# Patient Record
Sex: Male | Born: 1935
Health system: Southern US, Community
[De-identification: ages and names within clinical notes are randomized; demographics above are authoritative.]

## PROBLEM LIST (undated history)

## (undated) DIAGNOSIS — I4891 Unspecified atrial fibrillation: Secondary | ICD-10-CM

## (undated) DIAGNOSIS — M1612 Unilateral primary osteoarthritis, left hip: Secondary | ICD-10-CM

## (undated) DIAGNOSIS — I10 Essential (primary) hypertension: Secondary | ICD-10-CM

## (undated) DIAGNOSIS — I499 Cardiac arrhythmia, unspecified: Secondary | ICD-10-CM

## (undated) DIAGNOSIS — R2681 Unsteadiness on feet: Secondary | ICD-10-CM

## (undated) DIAGNOSIS — R351 Nocturia: Secondary | ICD-10-CM

## (undated) DIAGNOSIS — M1990 Unspecified osteoarthritis, unspecified site: Secondary | ICD-10-CM

## (undated) DIAGNOSIS — K219 Gastro-esophageal reflux disease without esophagitis: Secondary | ICD-10-CM

## (undated) DIAGNOSIS — N400 Enlarged prostate without lower urinary tract symptoms: Secondary | ICD-10-CM

## (undated) DIAGNOSIS — D471 Chronic myeloproliferative disease: Secondary | ICD-10-CM

## (undated) DIAGNOSIS — D62 Acute posthemorrhagic anemia: Secondary | ICD-10-CM

## (undated) DIAGNOSIS — K5900 Constipation, unspecified: Secondary | ICD-10-CM

## (undated) DIAGNOSIS — L409 Psoriasis, unspecified: Secondary | ICD-10-CM

## (undated) DIAGNOSIS — J309 Allergic rhinitis, unspecified: Secondary | ICD-10-CM

## (undated) DIAGNOSIS — D649 Anemia, unspecified: Secondary | ICD-10-CM

## (undated) HISTORY — DX: Unilateral primary osteoarthritis, left hip: M16.12

## (undated) HISTORY — DX: Constipation, unspecified: K59.00

## (undated) HISTORY — DX: Acute posthemorrhagic anemia: D62

## (undated) HISTORY — DX: Unsteadiness on feet: R26.81

## (undated) HISTORY — DX: Allergic rhinitis, unspecified: J30.9

## (undated) HISTORY — DX: Unspecified atrial fibrillation: I48.91

---

## 1996-11-21 HISTORY — PX: HERNIA REPAIR: SHX51

## 2000-09-01 ENCOUNTER — Encounter: Payer: Self-pay | Admitting: General Surgery

## 2000-09-01 ENCOUNTER — Encounter: Admission: RE | Admit: 2000-09-01 | Discharge: 2000-09-01 | Payer: Self-pay | Admitting: General Surgery

## 2000-09-04 ENCOUNTER — Ambulatory Visit (HOSPITAL_BASED_OUTPATIENT_CLINIC_OR_DEPARTMENT_OTHER): Admission: RE | Admit: 2000-09-04 | Discharge: 2000-09-04 | Payer: Self-pay | Admitting: General Surgery

## 2001-08-10 ENCOUNTER — Ambulatory Visit (HOSPITAL_BASED_OUTPATIENT_CLINIC_OR_DEPARTMENT_OTHER): Admission: RE | Admit: 2001-08-10 | Discharge: 2001-08-10 | Payer: Self-pay | Admitting: *Deleted

## 2013-05-08 ENCOUNTER — Telehealth: Payer: Self-pay | Admitting: Internal Medicine

## 2013-05-08 NOTE — Telephone Encounter (Signed)
C/D 05/07/13 for appt. 06/03/13

## 2013-05-31 ENCOUNTER — Other Ambulatory Visit: Payer: Self-pay | Admitting: Medical Oncology

## 2013-05-31 DIAGNOSIS — D72829 Elevated white blood cell count, unspecified: Secondary | ICD-10-CM

## 2013-06-03 ENCOUNTER — Ambulatory Visit (HOSPITAL_BASED_OUTPATIENT_CLINIC_OR_DEPARTMENT_OTHER): Payer: Medicare Other | Admitting: Internal Medicine

## 2013-06-03 ENCOUNTER — Encounter: Payer: Self-pay | Admitting: Internal Medicine

## 2013-06-03 ENCOUNTER — Other Ambulatory Visit (HOSPITAL_BASED_OUTPATIENT_CLINIC_OR_DEPARTMENT_OTHER): Payer: Medicare Other | Admitting: Lab

## 2013-06-03 ENCOUNTER — Ambulatory Visit: Payer: Self-pay

## 2013-06-03 ENCOUNTER — Telehealth: Payer: Self-pay | Admitting: Internal Medicine

## 2013-06-03 VITALS — BP 151/75 | HR 54 | Temp 97.3°F | Resp 18 | Ht 68.0 in | Wt 185.9 lb

## 2013-06-03 DIAGNOSIS — D72829 Elevated white blood cell count, unspecified: Secondary | ICD-10-CM

## 2013-06-03 LAB — COMPREHENSIVE METABOLIC PANEL (CC13)
ALT: 16 U/L (ref 0–55)
AST: 19 U/L (ref 5–34)
Albumin: 3.8 g/dL (ref 3.5–5.0)
Alkaline Phosphatase: 92 U/L (ref 40–150)
BUN: 20 mg/dL (ref 7.0–26.0)
CO2: 26 mEq/L (ref 22–29)
Calcium: 10.6 mg/dL — ABNORMAL HIGH (ref 8.4–10.4)
Chloride: 106 mEq/L (ref 98–109)
Creatinine: 1.2 mg/dL (ref 0.7–1.3)
Glucose: 92 mg/dl (ref 70–140)
Potassium: 4.3 mEq/L (ref 3.5–5.1)
Sodium: 141 mEq/L (ref 136–145)
Total Bilirubin: 0.67 mg/dL (ref 0.20–1.20)
Total Protein: 8 g/dL (ref 6.4–8.3)

## 2013-06-03 LAB — CBC WITH DIFFERENTIAL/PLATELET
BASO%: 0.3 % (ref 0.0–2.0)
Basophils Absolute: 0 10*3/uL (ref 0.0–0.1)
EOS%: 0.9 % (ref 0.0–7.0)
Eosinophils Absolute: 0.1 10*3/uL (ref 0.0–0.5)
HCT: 45.6 % (ref 38.4–49.9)
HGB: 15.3 g/dL (ref 13.0–17.1)
LYMPH%: 16.8 % (ref 14.0–49.0)
MCH: 31.5 pg (ref 27.2–33.4)
MCHC: 33.6 g/dL (ref 32.0–36.0)
MCV: 93.8 fL (ref 79.3–98.0)
MONO#: 1.1 10*3/uL — ABNORMAL HIGH (ref 0.1–0.9)
MONO%: 8.3 % (ref 0.0–14.0)
NEUT#: 10.2 10*3/uL — ABNORMAL HIGH (ref 1.5–6.5)
NEUT%: 73.7 % (ref 39.0–75.0)
Platelets: 380 10*3/uL (ref 140–400)
RBC: 4.86 10*6/uL (ref 4.20–5.82)
RDW: 14.3 % (ref 11.0–14.6)
WBC: 13.8 10*3/uL — ABNORMAL HIGH (ref 4.0–10.3)
lymph#: 2.3 10*3/uL (ref 0.9–3.3)

## 2013-06-03 NOTE — Progress Notes (Signed)
[facility] [phone]   [phone]  CONSULT NOTE  REFERRING PHYSICIAN: Dr. Catha Gosselin  REASON FOR CONSULTATION:  79 years old African American male with persistent leukocytosis.  HPI Adam Tapia is a 79 y.o. male with past medical history significant for hypertension, GERD, depression, benign prostatic hypertrophy, allergic rhinitis as well as psoriasis. The patient was seen recently by his primary care physician Dr. Biagio Tapia and repeat CBC on 05/02/2013 showed elevated white blood count of 15.7 with absolute neutrophil count of 11,500 and elevated monocytes of 150. Previous CBC on 05/01/2013 showed elevated white blood count of 14.3 with absolute neutrophil count of 10,400. The patient has normal hemoglobin, hematocrit as well as platelets count. Because of the persistent leukocytosis he was referred to me today for evaluation and recommendation regarding his condition. The patient denied having any recurrent infection specifically no effect infection or upper respiratory infection. He denied having any inflammatory process. He is not on any steroid or other hormonal treatment. He is feeling fine today with no specific complaints except for pain on the left leg and left hip. He is currently on Turmeric herbs powder for arthritis. He denied having any significant weight loss or night sweats. He denied having any chest pain, shortness breath, cough or hemoptysis. The patient denied having any bleeding issues.  PAST MEDICAL HISTORY: Hypertension, GERD, depression, benign prostatic hypertrophy, psoriasis and allergic rhinitis. The patient denied having any history of diabetes mellitus, coronary artery disease or stroke.   FAMILY HISTORY: Mother had heart disease and father had neuritis.  SOCIAL HISTORY: The patient is married and has one daughter. He has no history of smoking, alcohol or drug abuse.  No Known Allergies  Current Outpatient  Prescriptions  Medication Sig Dispense Refill  . amLODipine (NORVASC) 5 MG tablet Take 5 mg by mouth daily.      Marland Kitchen atenolol (TENORMIN) 50 MG tablet Take 50 mg by mouth daily.      . digoxin (LANOXIN) 0.25 MG tablet Take 0.25 mg by mouth daily.      . fexofenadine (ALLEGRA) 180 MG tablet Take 180 mg by mouth daily.      Marland Kitchen ketotifen (ZADITOR) 0.025 % ophthalmic solution 1 drop 2 (two) times daily.      Marland Kitchen triamterene-hydrochlorothiazide (MAXZIDE-25) 37.5-25 MG per tablet Take 1 tablet by mouth daily.      . Turmeric POWD by Does not apply route.       No current facility-administered medications for this visit.    Review of Systems  A comprehensive review of systems was negative except for: Musculoskeletal: positive for arthralgias and bone pain  Physical Exam  AVW:UJWJX, healthy, no distress, well nourished and well developed SKIN: skin color, texture, turgor are normal HEAD: Normocephalic, No masses, lesions, tenderness or abnormalities EYES: normal EARS: External ears normal OROPHARYNX:no exudate and no erythema  NECK: supple, no adenopathy LYMPH:  no palpable lymphadenopathy, no hepatosplenomegaly, moderate nontender anterior cervical nodes, moderate tender anterior cervical nodes LUNGS: clear to auscultation  HEART: regular rate & rhythm and no murmurs ABDOMEN:abdomen soft, non-tender, normal bowel sounds and no masses or organomegaly BACK: Back symmetric, no curvature. EXTREMITIES:no joint deformities, effusion, or inflammation, no edema, no skin discoloration  NEURO: alert & oriented x 3 with fluent speech, no focal motor/sensory deficits  PERFORMANCE STATUS: ECOG 1  LABORATORY DATA: Lab Results  Component Value Date   WBC 13.8* 06/03/2013   HGB 15.3 06/03/2013   HCT 45.6 06/03/2013  MCV 93.8 06/03/2013   PLT 380 06/03/2013      Chemistry      Component Value Date/Time   NA 141 06/03/2013 1346   K 4.3 06/03/2013 1346   CO2 26 06/03/2013 1346   BUN 20.0 06/03/2013 1346     CREATININE 1.2 06/03/2013 1346      Component Value Date/Time   CALCIUM 10.6* 06/03/2013 1346   ALKPHOS 92 06/03/2013 1346   AST 19 06/03/2013 1346   ALT 16 06/03/2013 1346   BILITOT 0.67 06/03/2013 1346       RADIOGRAPHIC STUDIES: No results found.  ASSESSMENT: This is a very pleasant 79 years old [name] with persistent leukocytosis and neutrophilia of unclear etiology but most likely reactive in nature. I cannot rule it out myeloproliferative disorder at this point.  PLAN: I have a lengthy discussion with the patient and his wife today about his current disease status and further investigation to rule out myeloproliferative disorder. I several studies today including repeat CBC, comprehensive metabolic panel in addition to molecular study for BCR/ABL and leukocyte alkaline phosphatase. I will see the patient back for followup visit in 3 weeks for reevaluation. He was advised to call immediately if he has any concerning symptoms in the interval  All questions were answered. The patient knows to call the clinic with any problems, questions or concerns. We can certainly see the patient much sooner if necessary.  Thank you so much for allowing me to participate in the care of Adam Tapia. I will continue to follow up the patient with you and assist in his care.  I spent 35 minutes counseling the patient face to face. The total time spent in the appointment was 50 minutes.   Loretha Ure K. 06/03/2013, 3:20 PM

## 2013-06-03 NOTE — Patient Instructions (Signed)
You have persistent leukocytosis most likely reactive in nature but I ordered your lab test to rule out myeloproliferative disorder.  Followup visit in 3 weeks

## 2013-06-03 NOTE — Telephone Encounter (Signed)
gv and printed appt sched and avs for pt  °

## 2013-06-04 ENCOUNTER — Other Ambulatory Visit (HOSPITAL_BASED_OUTPATIENT_CLINIC_OR_DEPARTMENT_OTHER): Payer: Medicare Other | Admitting: Lab

## 2013-06-04 DIAGNOSIS — D72829 Elevated white blood cell count, unspecified: Secondary | ICD-10-CM

## 2013-06-04 LAB — CBC WITH DIFFERENTIAL/PLATELET
Eosinophils Absolute: 0.2 10*3/uL (ref 0.0–0.5)
HCT: 43 % (ref 38.4–49.9)
LYMPH%: 13.8 % — ABNORMAL LOW (ref 14.0–49.0)
MCV: 94 fL (ref 79.3–98.0)
MONO#: 1.1 10*3/uL — ABNORMAL HIGH (ref 0.1–0.9)
MONO%: 7.4 % (ref 0.0–14.0)
NEUT#: 11.2 10*3/uL — ABNORMAL HIGH (ref 1.5–6.5)
NEUT%: 77.1 % — ABNORMAL HIGH (ref 39.0–75.0)
Platelets: 383 10*3/uL (ref 140–400)
RBC: 4.58 10*6/uL (ref 4.20–5.82)
WBC: 14.6 10*3/uL — ABNORMAL HIGH (ref 4.0–10.3)

## 2013-06-21 ENCOUNTER — Other Ambulatory Visit: Payer: Self-pay | Admitting: *Deleted

## 2013-06-21 DIAGNOSIS — D72829 Elevated white blood cell count, unspecified: Secondary | ICD-10-CM

## 2013-06-24 ENCOUNTER — Other Ambulatory Visit (HOSPITAL_BASED_OUTPATIENT_CLINIC_OR_DEPARTMENT_OTHER): Payer: Medicare Other | Admitting: Lab

## 2013-06-24 ENCOUNTER — Ambulatory Visit (HOSPITAL_BASED_OUTPATIENT_CLINIC_OR_DEPARTMENT_OTHER): Payer: Medicare Other | Admitting: Internal Medicine

## 2013-06-24 ENCOUNTER — Encounter: Payer: Self-pay | Admitting: Internal Medicine

## 2013-06-24 ENCOUNTER — Ambulatory Visit: Payer: Medicare Other | Admitting: Internal Medicine

## 2013-06-24 VITALS — BP 160/80 | HR 56 | Temp 97.8°F | Resp 18 | Ht 68.0 in | Wt 187.1 lb

## 2013-06-24 DIAGNOSIS — D72829 Elevated white blood cell count, unspecified: Secondary | ICD-10-CM

## 2013-06-24 LAB — CBC WITH DIFFERENTIAL/PLATELET
BASO%: 0.4 % (ref 0.0–2.0)
EOS%: 1.2 % (ref 0.0–7.0)
HCT: 44.4 % (ref 38.4–49.9)
LYMPH%: 19.3 % (ref 14.0–49.0)
MCH: 31.5 pg (ref 27.2–33.4)
MCHC: 33.4 g/dL (ref 32.0–36.0)
MCV: 94.3 fL (ref 79.3–98.0)
MONO#: 1.2 10*3/uL — ABNORMAL HIGH (ref 0.1–0.9)
MONO%: 8.5 % (ref 0.0–14.0)
NEUT%: 70.6 % (ref 39.0–75.0)
Platelets: 383 10*3/uL (ref 140–400)
RBC: 4.71 10*6/uL (ref 4.20–5.82)
WBC: 14 10*3/uL — ABNORMAL HIGH (ref 4.0–10.3)

## 2013-06-24 NOTE — Patient Instructions (Signed)
Continue followup visit with her primary care physician

## 2013-06-24 NOTE — Progress Notes (Signed)
[facility] [phone]   [phone]  OFFICE PROGRESS NOTE  Mickie Hillier, MD [address] [place] [address]  DIAGNOSIS: Persistent leukocytosis most likely reactive in nature  PRIOR THERAPY: None  CURRENT THERAPY: Observation  INTERVAL HISTORY: Adam Tapia 79 y.o. male returns to the clinic today for followup visit accompanied his wife. The patient is feeling fine today with no specific complaints except for mild fatigue. He denied having any bleeding issues. He denied having any bruises or ecchymosis. The patient denied having any palpable lymphadenopathy. He has no nausea or vomiting. He has no chest pain, shortness breath, cough or hemoptysis. No significant weight loss or night sweats. He was seen 3 weeks ago for evaluation of persistent leukocytosis and I ordered several studies to rule out myeloproliferative disorder including leukocyte alkaline phosphatase in addition to molecular study for BCR/ABL. These tests were unremarkable for any evidence of myeloproliferative disorder. He is here today for evaluation and discussion of his lab results.   ALLERGIES:  has No Known Allergies.  MEDICATIONS:  Current Outpatient Prescriptions  Medication Sig Dispense Refill  . amLODipine (NORVASC) 5 MG tablet Take 5 mg by mouth 2 (two) times daily.       Marland Kitchen atenolol (TENORMIN) 50 MG tablet Take 50 mg by mouth daily.      . digoxin (LANOXIN) 0.25 MG tablet Take 0.25 mg by mouth daily.      . fexofenadine (ALLEGRA) 180 MG tablet Take 180 mg by mouth daily.      Marland Kitchen ketotifen (ZADITOR) 0.025 % ophthalmic solution 1 drop 2 (two) times daily.      Marland Kitchen losartan (COZAAR) 25 MG tablet Take 25 mg by mouth daily.      Marland Kitchen triamterene-hydrochlorothiazide (MAXZIDE-25) 37.5-25 MG per tablet Take 1 tablet by mouth daily.      . Turmeric POWD by Does not apply route.       No current facility-administered medications for this visit.    REVIEW OF  SYSTEMS:  A comprehensive review of systems was negative except for: Constitutional: positive for fatigue   PHYSICAL EXAMINATION: General appearance: alert, cooperative, fatigued and no distress Head: Normocephalic, without obvious abnormality, atraumatic Neck: no adenopathy Lymph nodes: Cervical, supraclavicular, and axillary nodes normal. Resp: clear to auscultation bilaterally Cardio: regular rate and rhythm, S1, S2 normal, no murmur, click, rub or gallop GI: soft, non-tender; bowel sounds normal; no masses,  no organomegaly Extremities: extremities normal, atraumatic, no cyanosis or edema  ECOG PERFORMANCE STATUS: 1 - Symptomatic but completely ambulatory  Blood pressure 160/80, pulse 56, temperature 97.8 F (36.6 C), temperature source Oral, resp. rate 18, height 5\' 8"  (1.727 m), weight 187 lb 1.6 oz (84.868 kg).  LABORATORY DATA: Lab Results  Component Value Date   WBC 14.0* 06/24/2013   HGB 14.8 06/24/2013   HCT 44.4 06/24/2013   MCV 94.3 06/24/2013   PLT 383 06/24/2013      Chemistry      Component Value Date/Time   NA 141 06/03/2013 1346   K 4.3 06/03/2013 1346   CO2 26 06/03/2013 1346   BUN 20.0 06/03/2013 1346   CREATININE 1.2 06/03/2013 1346      Component Value Date/Time   CALCIUM 10.6* 06/03/2013 1346   ALKPHOS 92 06/03/2013 1346   AST 19 06/03/2013 1346   ALT 16 06/03/2013 1346   BILITOT 0.67 06/03/2013 1346       RADIOGRAPHIC STUDIES: No results found.  ASSESSMENT AND  PLAN: Very pleasant 79 years old [name] with persistent leukocytosis most likely reactive in nature. The patient has no evidence for myeloproliferative disorder based on the recent molecular studies. I discussed the lab result with the patient and his wife. I recommended for him to continue on observation for now with routine followup visit with his primary care physician. I would see the patient an as-needed basis in the future.  The patient voices understanding of current disease status  and treatment options and is in agreement with the current care plan.  All questions were answered. The patient knows to call the clinic with any problems, questions or concerns. We can certainly see the patient much sooner if necessary.

## 2013-06-26 ENCOUNTER — Telehealth: Payer: Self-pay | Admitting: Medical Oncology

## 2013-06-26 NOTE — Telephone Encounter (Signed)
Requests diagnosis code for 06/03/13-information given.

## 2014-05-16 ENCOUNTER — Telehealth: Payer: Self-pay | Admitting: *Deleted

## 2014-05-16 DIAGNOSIS — D72829 Elevated white blood cell count, unspecified: Secondary | ICD-10-CM

## 2014-05-16 NOTE — Telephone Encounter (Signed)
Received referral from Dr Rex Kras that he wants Dr Vista Mink to see pt again for elevated WBC.  Referral given to Dr Vista Mink to review. SLJ

## 2014-05-19 ENCOUNTER — Telehealth: Payer: Self-pay | Admitting: *Deleted

## 2014-05-19 NOTE — Telephone Encounter (Signed)
Per Dr Vista Mink, okay to see patient.  Onc tx schedule filled out.  SLJ

## 2014-05-19 NOTE — Telephone Encounter (Signed)
error 

## 2014-05-19 NOTE — Addendum Note (Signed)
Addended by: Britt Bottom on: 05/19/2014 10:29 AM   Modules accepted: Orders

## 2014-05-20 ENCOUNTER — Telehealth: Payer: Self-pay | Admitting: Internal Medicine

## 2014-05-20 NOTE — Telephone Encounter (Signed)
, °

## 2014-06-09 ENCOUNTER — Other Ambulatory Visit (HOSPITAL_BASED_OUTPATIENT_CLINIC_OR_DEPARTMENT_OTHER): Payer: Medicare Other

## 2014-06-09 ENCOUNTER — Telehealth: Payer: Self-pay | Admitting: Oncology

## 2014-06-09 ENCOUNTER — Ambulatory Visit (HOSPITAL_BASED_OUTPATIENT_CLINIC_OR_DEPARTMENT_OTHER): Payer: Medicare Other | Admitting: Internal Medicine

## 2014-06-09 ENCOUNTER — Encounter: Payer: Self-pay | Admitting: Internal Medicine

## 2014-06-09 VITALS — BP 157/66 | HR 57 | Temp 98.2°F | Resp 18 | Ht 68.0 in | Wt 183.4 lb

## 2014-06-09 DIAGNOSIS — D72829 Elevated white blood cell count, unspecified: Secondary | ICD-10-CM

## 2014-06-09 LAB — CBC WITH DIFFERENTIAL/PLATELET
BASO%: 0.2 % (ref 0.0–2.0)
BASOS ABS: 0.1 10*3/uL (ref 0.0–0.1)
EOS ABS: 0.2 10*3/uL (ref 0.0–0.5)
EOS%: 0.9 % (ref 0.0–7.0)
HCT: 44.7 % (ref 38.4–49.9)
HEMOGLOBIN: 14.9 g/dL (ref 13.0–17.1)
LYMPH#: 3.6 10*3/uL — AB (ref 0.9–3.3)
LYMPH%: 13.1 % — ABNORMAL LOW (ref 14.0–49.0)
MCH: 31.7 pg (ref 27.2–33.4)
MCHC: 33.2 g/dL (ref 32.0–36.0)
MCV: 95.6 fL (ref 79.3–98.0)
MONO#: 1.8 10*3/uL — AB (ref 0.1–0.9)
MONO%: 6.5 % (ref 0.0–14.0)
NEUT%: 79.3 % — AB (ref 39.0–75.0)
NEUTROS ABS: 21.8 10*3/uL — AB (ref 1.5–6.5)
PLATELETS: 500 10*3/uL — AB (ref 140–400)
RBC: 4.68 10*6/uL (ref 4.20–5.82)
RDW: 14.7 % — ABNORMAL HIGH (ref 11.0–14.6)
WBC: 27.5 10*3/uL — AB (ref 4.0–10.3)

## 2014-06-09 NOTE — Progress Notes (Signed)
[facility] [phone]   [phone]  OFFICE PROGRESS NOTE  Gennette Pac, MD [place] [address]  DIAGNOSIS: Persistent leukocytosis most likely reactive in nature  PRIOR THERAPY: None.  CURRENT THERAPY: Observation  INTERVAL HISTORY: Adam Tapia 80 y.o. male returns to the clinic today for followup visit accompanied his wife. The patient was last seen in August of 2014. He was followed by his primary care physician since that time and he was noted to have elevated white blood count. He was referred back to me for evaluation. He recently had a bout of bronchitis as well as sinusitis for the last 2 months in addition to recent steroid injection by his primary care physician for the sinus infection. His this molecular studies for BCR/ABL as well as leukocyte alkaline phosphatase were consistent with reactive leukocytosis. He denied having any other significant complaints today except for mild bronchitis. He denied having any significant chest pain, shortness of breath but has mild cough with no hemoptysis. He has no significant weight loss or night sweats. He has no nausea or vomiting. He has repeat CBC performed earlier today and he is here for evaluation and discussion of his lab results.   ALLERGIES:  has No Known Allergies.  MEDICATIONS:  Current Outpatient Prescriptions  Medication Sig Dispense Refill  . amLODipine (NORVASC) 5 MG tablet Take 5 mg by mouth 2 (two) times daily.       Marland Kitchen atenolol (TENORMIN) 50 MG tablet Take 50 mg by mouth daily.      . digoxin (LANOXIN) 0.25 MG tablet Take 0.25 mg by mouth daily.      . fexofenadine (ALLEGRA) 180 MG tablet Take 180 mg by mouth daily.      Marland Kitchen ketotifen (ZADITOR) 0.025 % ophthalmic solution 1 drop 2 (two) times daily.      Marland Kitchen losartan (COZAAR) 25 MG tablet Take 25 mg by mouth daily.      Marland Kitchen triamterene-hydrochlorothiazide (MAXZIDE-25) 37.5-25 MG per tablet Take 1 tablet  by mouth daily.      . Turmeric POWD by Does not apply route.      . fluticasone (FLONASE) 50 MCG/ACT nasal spray        No current facility-administered medications for this visit.    REVIEW OF SYSTEMS:  A comprehensive review of systems was negative except for: Constitutional: positive for fatigue   PHYSICAL EXAMINATION: General appearance: alert, cooperative, fatigued and no distress Head: Normocephalic, without obvious abnormality, atraumatic Neck: no adenopathy Lymph nodes: Cervical, supraclavicular, and axillary nodes normal. Resp: clear to auscultation bilaterally Cardio: regular rate and rhythm, S1, S2 normal, no murmur, click, rub or gallop GI: soft, non-tender; bowel sounds normal; no masses,  no organomegaly Extremities: extremities normal, atraumatic, no cyanosis or edema  ECOG PERFORMANCE STATUS: 1 - Symptomatic but completely ambulatory  Blood pressure 157/66, pulse 57, temperature 98.2 F (36.8 C), temperature source Oral, resp. rate 18, height 5\' 8"  (1.727 m), weight 183 lb 6.4 oz (83.19 kg), SpO2 99.00%.  LABORATORY DATA: Lab Results  Component Value Date   WBC 27.5* 06/09/2014   HGB 14.9 06/09/2014   HCT 44.7 06/09/2014   MCV 95.6 06/09/2014   PLT 500* 06/09/2014      Chemistry      Component Value Date/Time   NA 141 06/03/2013 1346   K 4.3 06/03/2013 1346   CO2 26 06/03/2013 1346   BUN 20.0 06/03/2013 1346   CREATININE 1.2 06/03/2013  1346      Component Value Date/Time   CALCIUM 10.6* 06/03/2013 1346   ALKPHOS 92 06/03/2013 1346   AST 19 06/03/2013 1346   ALT 16 06/03/2013 1346   BILITOT 0.67 06/03/2013 1346       RADIOGRAPHIC STUDIES: No results found.  ASSESSMENT AND PLAN: Very pleasant 80 years old [place] American male [place] male with persistent leukocytosis most likely reactive in nature. The patient has no evidence for myeloproliferative disorder based on the recent molecular studies. He has is in the total white blood count recently which still be reactive  secondary to recent infection and steroid injection. I will order JAK-2 mutation rule out myeloproliferative disorder. I would see the patient back for followup visit in 2 weeks for reevaluation with repeat CBC. He was advised to call immediately if she has any concerning symptoms in the interval. The patient voices understanding of current disease status and treatment options and is in agreement with the current care plan.  All questions were answered. The patient knows to call the clinic with any problems, questions or concerns. We can certainly see the patient much sooner if necessary.  Disclaimer: This note was dictated with voice recognition software. Similar sounding words can inadvertently be transcribed and may not be corrected upon review.

## 2014-06-09 NOTE — Telephone Encounter (Signed)
Pt confirmed labs/ov per 07/20 POF, gave pt AVS and sent  back to labs per order...Marland KitchenMarland KitchenKJ

## 2014-06-16 ENCOUNTER — Other Ambulatory Visit: Payer: Self-pay | Admitting: Medical Oncology

## 2014-06-23 ENCOUNTER — Other Ambulatory Visit: Payer: Medicare Other

## 2014-06-23 ENCOUNTER — Ambulatory Visit: Payer: Medicare Other | Admitting: Oncology

## 2014-06-24 ENCOUNTER — Other Ambulatory Visit (HOSPITAL_BASED_OUTPATIENT_CLINIC_OR_DEPARTMENT_OTHER): Payer: Medicare Other

## 2014-06-24 ENCOUNTER — Encounter: Payer: Self-pay | Admitting: Physician Assistant

## 2014-06-24 ENCOUNTER — Telehealth: Payer: Self-pay | Admitting: Internal Medicine

## 2014-06-24 ENCOUNTER — Ambulatory Visit (HOSPITAL_BASED_OUTPATIENT_CLINIC_OR_DEPARTMENT_OTHER): Payer: Medicare Other | Admitting: Physician Assistant

## 2014-06-24 VITALS — BP 158/66 | HR 61 | Temp 98.2°F | Resp 18 | Ht 68.0 in | Wt 181.2 lb

## 2014-06-24 DIAGNOSIS — D72829 Elevated white blood cell count, unspecified: Secondary | ICD-10-CM

## 2014-06-24 LAB — CBC WITH DIFFERENTIAL/PLATELET
BASO%: 0.8 % (ref 0.0–2.0)
BASOS ABS: 0.1 10*3/uL (ref 0.0–0.1)
EOS%: 1 % (ref 0.0–7.0)
Eosinophils Absolute: 0.2 10*3/uL (ref 0.0–0.5)
HEMATOCRIT: 44.4 % (ref 38.4–49.9)
HEMOGLOBIN: 14.6 g/dL (ref 13.0–17.1)
LYMPH%: 15.7 % (ref 14.0–49.0)
MCH: 32 pg (ref 27.2–33.4)
MCHC: 33 g/dL (ref 32.0–36.0)
MCV: 97.1 fL (ref 79.3–98.0)
MONO#: 1.5 10*3/uL — ABNORMAL HIGH (ref 0.1–0.9)
MONO%: 7.6 % (ref 0.0–14.0)
NEUT#: 14.9 10*3/uL — ABNORMAL HIGH (ref 1.5–6.5)
NEUT%: 74.9 % (ref 39.0–75.0)
PLATELETS: 487 10*3/uL — AB (ref 140–400)
RBC: 4.57 10*6/uL (ref 4.20–5.82)
RDW: 15.4 % — ABNORMAL HIGH (ref 11.0–14.6)
WBC: 19.9 10*3/uL — ABNORMAL HIGH (ref 4.0–10.3)
lymph#: 3.1 10*3/uL (ref 0.9–3.3)

## 2014-06-24 NOTE — Telephone Encounter (Signed)
gv and printed appt sched and avs for pt for Aug °

## 2014-06-24 NOTE — Progress Notes (Addendum)
[facility] [phone]   Fax:(336) Bethany, MD [place] [address]  DIAGNOSIS: Persistent leukocytosis consistent with a myeloproliferative disorder with positive JAK 2 mutation  PRIOR THERAPY: None.  CURRENT THERAPY: Hydrea 500 mg by mouth daily. Therapy expected to begin in the next few days  INTERVAL HISTORY: Tapia Tapia 80 y.o. male returns to the clinic today for followup visit accompanied his wife. The patient was last seen on 06/09/2014 by Dr. Julien Tapia. At that time he ordered the JAK 2 test to see if this was positive and potentially explain Tapia Tapia's leukocytosis.  His this molecular studies for BCR/ABL as well as leukocyte alkaline phosphatase were consistent with reactive leukocytosis. He denied having any significant complaints today. He does note occasional mild night sweats.  He denied having any significant chest pain, shortness of breath , cough or  hemoptysis. He has no significant weight loss or night sweats. He has no nausea or vomiting. He had repeat CBC performed earlier today and he is here for evaluation and discussion of his lab results as well as the results of JAK 2 test.   ALLERGIES:  has No Known Allergies.  MEDICATIONS:  Current Outpatient Prescriptions  Medication Sig Dispense Refill  . amLODipine (NORVASC) 5 MG tablet Take 5 mg by mouth 2 (two) times daily.       Marland Kitchen atenolol (TENORMIN) 50 MG tablet Take 25 mg by mouth daily.       . digoxin (LANOXIN) 0.25 MG tablet Take 0.25 mg by mouth daily.      . fexofenadine (ALLEGRA) 180 MG tablet Take 180 mg by mouth daily.      . fluticasone (FLONASE) 50 MCG/ACT nasal spray       . losartan (COZAAR) 25 MG tablet Take 25 mg by mouth daily.      Marland Kitchen triamterene-hydrochlorothiazide (MAXZIDE-25) 37.5-25 MG per tablet Take 1 tablet by mouth daily. Pt states he takes 1/2 tablet daily      . ketotifen (ZADITOR)  0.025 % ophthalmic solution 1 drop 2 (two) times daily.       No current facility-administered medications for this visit.    REVIEW OF SYSTEMS:  A comprehensive review of systems was negative except for: Constitutional: positive for fatigue   PHYSICAL EXAMINATION: General appearance: alert, cooperative, fatigued and no distress Head: Normocephalic, without obvious abnormality, atraumatic Neck: no adenopathy Lymph nodes: Cervical, supraclavicular, and axillary nodes normal. Resp: clear to auscultation bilaterally Cardio: regular rate and rhythm, S1, S2 normal, no murmur, click, rub or gallop GI: soft, non-tender; bowel sounds normal; no masses,  no organomegaly Extremities: extremities normal, atraumatic, no cyanosis or edema  ECOG PERFORMANCE STATUS: 1 - Symptomatic but completely ambulatory  Blood pressure 158/66, pulse 61, temperature 98.2 F (36.8 C), temperature source Oral, resp. rate 18, height _0  (1.727 m), weight 181 lb 3.2 oz (82.192 kg), SpO2 99.00%.  LABORATORY DATA: Lab Results  Component Value Date   WBC 19.9* 06/24/2014   HGB 14.6 06/24/2014   HCT 44.4 06/24/2014   MCV 97.1 06/24/2014   PLT 487* 06/24/2014      Chemistry      Component Value Date/Time   NA 141 06/03/2013 1346   K 4.3 06/03/2013 1346   CO2 26 06/03/2013 1346   BUN 20.0 06/03/2013 1346   CREATININE 1.2 06/03/2013 1346      Component Value Date/Time   CALCIUM  10.6* 06/03/2013 1346   ALKPHOS 92 06/03/2013 1346   AST 19 06/03/2013 1346   ALT 16 06/03/2013 1346   BILITOT 0.67 06/03/2013 1346       RADIOGRAPHIC STUDIES: No results found.  ASSESSMENT AND PLAN: Very pleasant 80 years old [place] American male with persistent leukocytosis most likely reactive in nature. The patient has no evidence for myeloproliferative disorder based on the recent molecular studies. His total white count is now 19.9 which is decreased from the 27.5 was 2 weeks ago however still significantly elevated. The jet 2 mutation was  positive. The patient was discussed with and also seen by Dr. Julien Tapia. We will start Tapia Tapia on Hydrea 500 mg by mouth daily. A prescription for this medication was sent to his pharmacy of record, a total of 30 capsules with 2 refills, via E. scribe. He will followup in approximately 3 weeks for another symptom management visit and repeat CBC differential, C. met and LDH.  He was advised to call immediately if she has any concerning symptoms in the interval. The patient voices understanding of current disease status and treatment options and is in agreement with the current care plan.  All questions were answered. The patient knows to call the clinic with any problems, questions or concerns. We can certainly see the patient much sooner if necessary.  Tapia Adam PA-C  ADDENDUM: Hematology/Oncology Attending: I had a face to face encounter with the patient. I recommended his care plan. This is a very pleasant 80 years old male recently diagnosed with myeloproliferative disorder with positive JAK-2 mutation. His recent molecular studies showed the positive mutation. I discussed the lab result with the patient today. I recommended for him to consider treatment with hydroxyurea at 500 mg by mouth daily. I discussed with the patient adverse effect of this treatment including but not limited to alopecia, myelosuppression, nausea vomiting, peripheral neuropathy, liver or renal dysfunction. He would come back for followup visit in 3 weeks for reevaluation and management any adverse effect of his treatment. He was advised to call immediately if he has any concerning symptoms in the interval.  Disclaimer: This note was dictated with voice recognition software. Similar sounding words can inadvertently be transcribed and may not be corrected upon review. Tapia Tapia., MD 07/04/2014 '

## 2014-06-26 ENCOUNTER — Telehealth: Payer: Self-pay | Admitting: *Deleted

## 2014-06-26 MED ORDER — HYDROXYUREA 500 MG PO CAPS: 500.0000 mg | ORAL_CAPSULE | Freq: Every day | ORAL | Status: DC

## 2014-06-26 NOTE — Telephone Encounter (Signed)
Received phone call from patient stating the he has not received prescription for hydrea.  Informed Awilda Metro, PA.  Prescription sent to his pharmacy and patient's wife Water quality scientist) informed.  Patient's wife Water quality scientist) verbalized understanding.

## 2014-06-27 NOTE — Patient Instructions (Signed)
Take Hydrea (hydroxyurea) 500 mg by mouth daily Follow up in 3 weeks

## 2014-07-15 ENCOUNTER — Ambulatory Visit (HOSPITAL_BASED_OUTPATIENT_CLINIC_OR_DEPARTMENT_OTHER): Payer: Medicare Other | Admitting: Physician Assistant

## 2014-07-15 ENCOUNTER — Other Ambulatory Visit (HOSPITAL_BASED_OUTPATIENT_CLINIC_OR_DEPARTMENT_OTHER): Payer: Medicare Other

## 2014-07-15 ENCOUNTER — Telehealth: Payer: Self-pay | Admitting: Internal Medicine

## 2014-07-15 ENCOUNTER — Encounter: Payer: Self-pay | Admitting: Physician Assistant

## 2014-07-15 VITALS — BP 152/67 | HR 52 | Temp 98.2°F | Resp 20 | Ht 68.0 in | Wt 184.4 lb

## 2014-07-15 DIAGNOSIS — D47Z9 Other specified neoplasms of uncertain behavior of lymphoid, hematopoietic and related tissue: Secondary | ICD-10-CM

## 2014-07-15 DIAGNOSIS — D72829 Elevated white blood cell count, unspecified: Secondary | ICD-10-CM

## 2014-07-15 LAB — COMPREHENSIVE METABOLIC PANEL (CC13)
ALBUMIN: 3.5 g/dL (ref 3.5–5.0)
ALK PHOS: 96 U/L (ref 40–150)
ALT: 14 U/L (ref 0–55)
AST: 17 U/L (ref 5–34)
Anion Gap: 7 mEq/L (ref 3–11)
BUN: 24.5 mg/dL (ref 7.0–26.0)
CO2: 24 mEq/L (ref 22–29)
Calcium: 10 mg/dL (ref 8.4–10.4)
Chloride: 109 mEq/L (ref 98–109)
Creatinine: 1.2 mg/dL (ref 0.7–1.3)
GLUCOSE: 92 mg/dL (ref 70–140)
POTASSIUM: 3.6 meq/L (ref 3.5–5.1)
SODIUM: 140 meq/L (ref 136–145)
TOTAL PROTEIN: 7.8 g/dL (ref 6.4–8.3)
Total Bilirubin: 0.62 mg/dL (ref 0.20–1.20)

## 2014-07-15 LAB — CBC WITH DIFFERENTIAL/PLATELET
BASO%: 0.5 % (ref 0.0–2.0)
Basophils Absolute: 0.1 10*3/uL (ref 0.0–0.1)
EOS%: 0.6 % (ref 0.0–7.0)
Eosinophils Absolute: 0.1 10*3/uL (ref 0.0–0.5)
HCT: 43.3 % (ref 38.4–49.9)
HGB: 14.4 g/dL (ref 13.0–17.1)
LYMPH%: 12.3 % — ABNORMAL LOW (ref 14.0–49.0)
MCH: 32.5 pg (ref 27.2–33.4)
MCHC: 33.2 g/dL (ref 32.0–36.0)
MCV: 97.7 fL (ref 79.3–98.0)
MONO#: 1.5 10*3/uL — ABNORMAL HIGH (ref 0.1–0.9)
MONO%: 7.3 % (ref 0.0–14.0)
NEUT%: 79.3 % — ABNORMAL HIGH (ref 39.0–75.0)
NEUTROS ABS: 15.9 10*3/uL — AB (ref 1.5–6.5)
Platelets: 490 10*3/uL — ABNORMAL HIGH (ref 140–400)
RBC: 4.44 10*6/uL (ref 4.20–5.82)
RDW: 16.3 % — AB (ref 11.0–14.6)
WBC: 20 10*3/uL — ABNORMAL HIGH (ref 4.0–10.3)
lymph#: 2.5 10*3/uL (ref 0.9–3.3)

## 2014-07-15 LAB — LACTATE DEHYDROGENASE (CC13): LDH: 192 U/L (ref 125–245)

## 2014-07-15 NOTE — Telephone Encounter (Signed)
gv adn printed appt sched and avs for pt for Sept.... °

## 2014-07-15 NOTE — Patient Instructions (Signed)
Continue Hydrea 500 mg by mouth daily Followup in 2 weeks

## 2014-07-15 NOTE — Progress Notes (Signed)
[facility] Telephone:(336) [phone]   Fax:(336) [place], [place] [address]  DIAGNOSIS: Persistent leukocytosis consistent with a myeloproliferative disorder with positive JAK 2 mutation  PRIOR THERAPY: None.  CURRENT THERAPY: Hydrea 500 mg by mouth daily. Status post approximately 2 1/2 weeks of treatment.  INTERVAL HISTORY: Adam Tapia 80 y.o. male returns to the clinic today for followup visit accompanied his wife.  His molecular studies for BCR/ABL as well as leukocyte alkaline phosphatase were consistent with reactive leukocytosis.  He also had a positive JAK 2 mutation. He is tolerating the Hydrea without difficulty. He denied having any significant complaints today. He does note occasional mild night sweats.  He denied having any significant chest pain, shortness of breath , cough or  hemoptysis. He has no significant weight loss or night sweats. He has no nausea or vomiting. He had repeat CBC performed earlier today and he is here for evaluation and discussion of his lab results.  ALLERGIES:  has No Known Allergies.  MEDICATIONS:  Current Outpatient Prescriptions  Medication Sig Dispense Refill  . amLODipine (NORVASC) 5 MG tablet Take 5 mg by mouth 2 (two) times daily.       Marland Kitchen atenolol (TENORMIN) 50 MG tablet Take 25 mg by mouth daily.       . digoxin (LANOXIN) 0.25 MG tablet Take 0.25 mg by mouth daily.      . fexofenadine (ALLEGRA) 180 MG tablet Take 180 mg by mouth daily.      . fluticasone (FLONASE) 50 MCG/ACT nasal spray       . hydroxyurea (HYDREA) 500 MG capsule Take 1 capsule (500 mg total) by mouth daily. May take with food to minimize GI side effects.  30 capsule  2  . losartan (COZAAR) 25 MG tablet Take 25 mg by mouth daily.      Marland Kitchen triamterene-hydrochlorothiazide (MAXZIDE-25) 37.5-25 MG per tablet Take 1 tablet by mouth daily. Pt states he takes 1/2 tablet daily      .  ketotifen (ZADITOR) 0.025 % ophthalmic solution 1 drop 2 (two) times daily.       No current facility-administered medications for this visit.    REVIEW OF SYSTEMS:  A comprehensive review of systems was negative except for: Constitutional: positive for fatigue   PHYSICAL EXAMINATION: General appearance: alert, cooperative, fatigued and no distress Head: Normocephalic, without obvious abnormality, atraumatic Neck: no adenopathy Lymph nodes: Cervical, supraclavicular, and axillary nodes normal. Resp: clear to auscultation bilaterally Cardio: regular rate and rhythm, S1, S2 normal, no murmur, click, rub or gallop GI: soft, non-tender; bowel sounds normal; no masses,  no organomegaly Extremities: extremities normal, atraumatic, no cyanosis or edema  ECOG PERFORMANCE STATUS: 1 - Symptomatic but completely ambulatory  Blood pressure 152/67, pulse 52, temperature 98.2 F (36.8 C), temperature source Oral, resp. rate 20, height _0  (1.727 m), weight 184 lb 6.4 oz (83.643 kg).  LABORATORY DATA: Lab Results  Component Value Date   WBC 20.0* 07/15/2014   HGB 14.4 07/15/2014   HCT 43.3 07/15/2014   MCV 97.7 07/15/2014   PLT 490* 07/15/2014      Chemistry      Component Value Date/Time   NA 140 07/15/2014 1020   K 3.6 07/15/2014 1020   CO2 24 07/15/2014 1020   BUN 24.5 07/15/2014 1020   CREATININE 1.2 07/15/2014 1020      Component Value Date/Time  CALCIUM 10.0 07/15/2014 1020   ALKPHOS 96 07/15/2014 1020   AST 17 07/15/2014 1020   ALT 14 07/15/2014 1020   BILITOT 0.62 07/15/2014 1020       RADIOGRAPHIC STUDIES: No results found.  ASSESSMENT AND PLAN: Very pleasant 80 years old [place] American male with persistent leukocytosis most likely reactive in nature. The patient has no evidence for myeloproliferative disorder based on the recent molecular studies. His total white count is now 20.0 which is decreased from the 27.5 was 2 weeks ago however still significantly elevated. The JAK 2  mutation was positive. The patient was discussed with and also seen by Dr. Julien Nordmann.  Mr. Dejarnette will continue on Hydrea 500 mg by mouth daily. He'll followup in 2 weeks for another symptom management visit and recheck of his labs consisting of a CBC differential, C. met and LDH. Depending on his continue local leukocytosis, we may need to slowly titrate up his dose of Hydrea.  He was advised to call immediately if she has any concerning symptoms in the interval. The patient voices understanding of current disease status and treatment options and is in agreement with the current care plan.  All questions were answered. The patient knows to call the clinic with any problems, questions or concerns. We can certainly see the patient much sooner if necessary.  Disclaimer: This note was dictated with voice recognition software. Similar sounding words can inadvertently be transcribed and may not be corrected upon review. [name], PA-C 07/15/2014 ' ADDENDUM: Hematology/Oncology Attending: I had a face to face encounter with the patient today. I recommended his care plan. This is a very pleasant 80 years old African American male who was diagnosed with myeloproliferative disorder with posterior JAK-2 mutation. He is currently on treatment with hydroxyurea 500 mg by mouth daily started 2 weeks ago. He is tolerating his treatment fairly well with no significant adverse effects. His total white blood count is stable.  I recommended for the patient to continue on the current dose of hydroxyurea for now. He would come back for followup visit in 2 weeks for reevaluation and adjusting his dose of hydroxyurea according to the total white blood count. He was advised to call immediately if she has any concerning symptoms in the interval.  Disclaimer: This note was dictated with voice recognition software. Similar sounding words can inadvertently be transcribed and may be missed upon review. Eilleen Kempf., MD 07/15/2014

## 2014-07-30 ENCOUNTER — Other Ambulatory Visit (HOSPITAL_BASED_OUTPATIENT_CLINIC_OR_DEPARTMENT_OTHER): Payer: Medicare Other

## 2014-07-30 ENCOUNTER — Ambulatory Visit (HOSPITAL_BASED_OUTPATIENT_CLINIC_OR_DEPARTMENT_OTHER): Payer: Medicare Other | Admitting: Physician Assistant

## 2014-07-30 ENCOUNTER — Encounter: Payer: Self-pay | Admitting: Physician Assistant

## 2014-07-30 ENCOUNTER — Telehealth: Payer: Self-pay | Admitting: Internal Medicine

## 2014-07-30 VITALS — BP 145/63 | HR 49 | Temp 98.2°F | Resp 18 | Ht 68.0 in | Wt 181.2 lb

## 2014-07-30 DIAGNOSIS — D72829 Elevated white blood cell count, unspecified: Secondary | ICD-10-CM

## 2014-07-30 DIAGNOSIS — D47Z9 Other specified neoplasms of uncertain behavior of lymphoid, hematopoietic and related tissue: Secondary | ICD-10-CM

## 2014-07-30 LAB — CBC WITH DIFFERENTIAL/PLATELET
BASO%: 0.3 % (ref 0.0–2.0)
Basophils Absolute: 0.1 10*3/uL (ref 0.0–0.1)
EOS ABS: 0.2 10*3/uL (ref 0.0–0.5)
EOS%: 1 % (ref 0.0–7.0)
HCT: 42.7 % (ref 38.4–49.9)
HEMOGLOBIN: 14.4 g/dL (ref 13.0–17.1)
LYMPH%: 15 % (ref 14.0–49.0)
MCH: 33.3 pg (ref 27.2–33.4)
MCHC: 33.7 g/dL (ref 32.0–36.0)
MCV: 98.6 fL — AB (ref 79.3–98.0)
MONO#: 1.4 10*3/uL — ABNORMAL HIGH (ref 0.1–0.9)
MONO%: 7.8 % (ref 0.0–14.0)
NEUT%: 75.9 % — ABNORMAL HIGH (ref 39.0–75.0)
NEUTROS ABS: 13.8 10*3/uL — AB (ref 1.5–6.5)
Platelets: 402 10*3/uL — ABNORMAL HIGH (ref 140–400)
RBC: 4.33 10*6/uL (ref 4.20–5.82)
RDW: 17.3 % — AB (ref 11.0–14.6)
WBC: 18.2 10*3/uL — ABNORMAL HIGH (ref 4.0–10.3)
lymph#: 2.7 10*3/uL (ref 0.9–3.3)

## 2014-07-30 LAB — COMPREHENSIVE METABOLIC PANEL (CC13)
ALT: 14 U/L (ref 0–55)
AST: 18 U/L (ref 5–34)
Albumin: 3.5 g/dL (ref 3.5–5.0)
Alkaline Phosphatase: 92 U/L (ref 40–150)
Anion Gap: 8 mEq/L (ref 3–11)
BUN: 16.9 mg/dL (ref 7.0–26.0)
CHLORIDE: 106 meq/L (ref 98–109)
CO2: 24 mEq/L (ref 22–29)
Calcium: 9.8 mg/dL (ref 8.4–10.4)
Creatinine: 1.2 mg/dL (ref 0.7–1.3)
Glucose: 106 mg/dl (ref 70–140)
POTASSIUM: 4 meq/L (ref 3.5–5.1)
Sodium: 139 mEq/L (ref 136–145)
Total Bilirubin: 0.63 mg/dL (ref 0.20–1.20)
Total Protein: 7.9 g/dL (ref 6.4–8.3)

## 2014-07-30 LAB — LACTATE DEHYDROGENASE (CC13): LDH: 204 U/L (ref 125–245)

## 2014-07-30 NOTE — Telephone Encounter (Signed)
gv adn printed appt sched and avs for pt for OCT °

## 2014-07-30 NOTE — Patient Instructions (Signed)
Continue taking Hydrea (hydroxyurea) 500 mg by mouth daily Followup in one month with repeat laboratory studies

## 2014-07-30 NOTE — Progress Notes (Addendum)
[facility] Telephone:([phone]   Fax:(336) [place], [place] [address]  DIAGNOSIS: Persistent leukocytosis consistent with a myeloproliferative disorder with positive JAK 2 mutation  PRIOR THERAPY: None.  CURRENT THERAPY: Hydrea 500 mg by mouth daily. Status post approximately 2 1/2 weeks of treatment.  INTERVAL HISTORY: Adam Tapia 80 y.o. male returns to the clinic today for followup visit accompanied his wife.  His molecular studies for BCR/ABL as well as leukocyte alkaline phosphatase were consistent with reactive leukocytosis.  He also had a positive JAK 2 mutation. He continues to tolerate the Hydrea without difficulty. He denied having any significant complaints today. He notes occasional mild dizziness with position change. He denied having any significant chest pain, shortness of breath , cough or  hemoptysis. He has no significant weight loss or night sweats. He has no nausea or vomiting. He denied any bleeding or bruising. He had repeat CBC performed earlier today and he is here for evaluation and discussion of his lab results.  ALLERGIES:  has No Known Allergies.  MEDICATIONS:  Current Outpatient Prescriptions  Medication Sig Dispense Refill  . amLODipine (NORVASC) 5 MG tablet Take 5 mg by mouth 2 (two) times daily.       Marland Kitchen atenolol (TENORMIN) 50 MG tablet Take 25 mg by mouth daily.       . digoxin (LANOXIN) 0.25 MG tablet Take 0.25 mg by mouth daily.      . fexofenadine (ALLEGRA) 180 MG tablet Take 180 mg by mouth daily.      . fluticasone (FLONASE) 50 MCG/ACT nasal spray       . hydroxyurea (HYDREA) 500 MG capsule Take 1 capsule (500 mg total) by mouth daily. May take with food to minimize GI side effects.  30 capsule  2  . ketotifen (ZADITOR) 0.025 % ophthalmic solution 1 drop 2 (two) times daily.      Marland Kitchen losartan (COZAAR) 25 MG tablet Take 25 mg by mouth daily.      Marland Kitchen  triamterene-hydrochlorothiazide (MAXZIDE-25) 37.5-25 MG per tablet Take 1 tablet by mouth daily. Pt states he takes 1/2 tablet daily       No current facility-administered medications for this visit.    REVIEW OF SYSTEMS:  A comprehensive review of systems was negative except for: Constitutional: positive for fatigue Neurological: positive for dizziness and With position change   PHYSICAL EXAMINATION: General appearance: alert, cooperative, fatigued and no distress Head: Normocephalic, without obvious abnormality, atraumatic Neck: no adenopathy Lymph nodes: Cervical, supraclavicular, and axillary nodes normal. Resp: clear to auscultation bilaterally Cardio: regular rate and rhythm, S1, S2 normal, no murmur, click, rub or gallop GI: soft, non-tender; bowel sounds normal; no masses,  no organomegaly Extremities: extremities normal, atraumatic, no cyanosis or edema  ECOG PERFORMANCE STATUS: 1 - Symptomatic but completely ambulatory  Blood pressure 145/63, pulse 49, temperature 98.2 F (36.8 C), temperature source Oral, resp. rate 18, height 5' 8"  (1.727 m), weight 181 lb 3.2 oz (82.192 kg).  LABORATORY DATA: Lab Results  Component Value Date   WBC 18.2* 07/30/2014   HGB 14.4 07/30/2014   HCT 42.7 07/30/2014   MCV 98.6* 07/30/2014   PLT 402* 07/30/2014      Chemistry      Component Value Date/Time   NA 139 07/30/2014 0957   K 4.0 07/30/2014 0957   CO2 24 07/30/2014 0957   BUN 16.9 07/30/2014 0957  CREATININE 1.2 07/30/2014 0957      Component Value Date/Time   CALCIUM 9.8 07/30/2014 0957   ALKPHOS 92 07/30/2014 0957   AST 18 07/30/2014 0957   ALT 14 07/30/2014 0957   BILITOT 0.63 07/30/2014 0957       RADIOGRAPHIC STUDIES: No results found.  ASSESSMENT AND PLAN: Very pleasant 80 years old [place] American male with persistent leukocytosis most likely reactive in nature. The patient has no evidence for myeloproliferative disorder based on the recent molecular studies. The JAK 2 mutation was  positive. His total white count and platelet count continued to slowly decline. His total white count is now 18.2 with a platelet count of 402,000. Previously the total white count was 20,000 with a platelet count of 490,000.  The patient was discussed with and also seen by Dr. Julien Nordmann.  Mr. Adam Tapia will continue on Hydrea 500 mg by mouth daily. He'll followup in 1 month for another symptom management visit and recheck of his labs consisting of a CBC differential, C. met and LDH. Depending on his continue local leukocytosis, we may need to slowly titrate up his dose of Hydrea.  He was advised to call immediately if she has any concerning symptoms in the interval. The patient voices understanding of current disease status and treatment options and is in agreement with the current care plan.  All questions were answered. The patient knows to call the clinic with any problems, questions or concerns. We can certainly see the patient much sooner if necessary.  Disclaimer: This note was dictated with voice recognition software. Similar sounding words can inadvertently be transcribed and may not be corrected upon review. [name], PA-C 07/30/2014 ' ADDENDUM: Hematology/Oncology Attending: I had a face to face encounter with the patient. I recommended his care plan. This is a very pleasant 80 years old African American male with myeloproliferative disorder with positive JAK-2 mutation. He is currently on treatment with hydroxyurea 500 mg by mouth daily and tolerating it well. His CBC today showed some improvement in the total white blood count as well as platelets count. I recommended for the patient to continue on the current dose of hydroxyurea. See him back for followup visit in one month's for reevaluation and repeat blood work. We may consider increasing the dose of hydroxyurea is no significant improvement in his blood count by next visit. He was advised to call immediately if he has any  concerning symptoms in the interval.  Disclaimer: This note was dictated with voice recognition software. Similar sounding words can inadvertently be transcribed and may be missed upon review. Eilleen Kempf., MD 07/30/2014

## 2014-08-27 ENCOUNTER — Ambulatory Visit (HOSPITAL_BASED_OUTPATIENT_CLINIC_OR_DEPARTMENT_OTHER): Payer: Medicare Other | Admitting: Internal Medicine

## 2014-08-27 ENCOUNTER — Other Ambulatory Visit (HOSPITAL_BASED_OUTPATIENT_CLINIC_OR_DEPARTMENT_OTHER): Payer: Medicare Other

## 2014-08-27 ENCOUNTER — Encounter: Payer: Self-pay | Admitting: Internal Medicine

## 2014-08-27 ENCOUNTER — Telehealth: Payer: Self-pay | Admitting: Internal Medicine

## 2014-08-27 VITALS — BP 131/68 | HR 48 | Temp 98.1°F | Resp 18 | Ht 68.0 in | Wt 183.7 lb

## 2014-08-27 DIAGNOSIS — D471 Chronic myeloproliferative disease: Secondary | ICD-10-CM

## 2014-08-27 DIAGNOSIS — D72829 Elevated white blood cell count, unspecified: Secondary | ICD-10-CM

## 2014-08-27 DIAGNOSIS — D479 Neoplasm of uncertain behavior of lymphoid, hematopoietic and related tissue, unspecified: Secondary | ICD-10-CM

## 2014-08-27 LAB — CBC WITH DIFFERENTIAL/PLATELET
BASO%: 0.2 % (ref 0.0–2.0)
Basophils Absolute: 0 10*3/uL (ref 0.0–0.1)
EOS%: 0.8 % (ref 0.0–7.0)
Eosinophils Absolute: 0.1 10*3/uL (ref 0.0–0.5)
HCT: 42.1 % (ref 38.4–49.9)
HGB: 14.3 g/dL (ref 13.0–17.1)
LYMPH%: 13.9 % — ABNORMAL LOW (ref 14.0–49.0)
MCH: 34.3 pg — ABNORMAL HIGH (ref 27.2–33.4)
MCHC: 34 g/dL (ref 32.0–36.0)
MCV: 101 fL — ABNORMAL HIGH (ref 79.3–98.0)
MONO#: 1.3 10*3/uL — AB (ref 0.1–0.9)
MONO%: 7.9 % (ref 0.0–14.0)
NEUT#: 12.9 10*3/uL — ABNORMAL HIGH (ref 1.5–6.5)
NEUT%: 77.2 % — ABNORMAL HIGH (ref 39.0–75.0)
Platelets: 367 10*3/uL (ref 140–400)
RBC: 4.17 10*6/uL — AB (ref 4.20–5.82)
RDW: 17.7 % — ABNORMAL HIGH (ref 11.0–14.6)
WBC: 16.7 10*3/uL — AB (ref 4.0–10.3)
lymph#: 2.3 10*3/uL (ref 0.9–3.3)

## 2014-08-27 LAB — COMPREHENSIVE METABOLIC PANEL (CC13)
ALT: 16 U/L (ref 0–55)
AST: 17 U/L (ref 5–34)
Albumin: 3.4 g/dL — ABNORMAL LOW (ref 3.5–5.0)
Alkaline Phosphatase: 100 U/L (ref 40–150)
Anion Gap: 5 mEq/L (ref 3–11)
BILIRUBIN TOTAL: 0.54 mg/dL (ref 0.20–1.20)
BUN: 20 mg/dL (ref 7.0–26.0)
CO2: 28 meq/L (ref 22–29)
Calcium: 10.3 mg/dL (ref 8.4–10.4)
Chloride: 107 mEq/L (ref 98–109)
Creatinine: 1.2 mg/dL (ref 0.7–1.3)
Glucose: 95 mg/dl (ref 70–140)
Potassium: 4.2 mEq/L (ref 3.5–5.1)
SODIUM: 140 meq/L (ref 136–145)
TOTAL PROTEIN: 7.8 g/dL (ref 6.4–8.3)

## 2014-08-27 LAB — LACTATE DEHYDROGENASE (CC13): LDH: 180 U/L (ref 125–245)

## 2014-08-27 NOTE — Telephone Encounter (Signed)
Pt confirmed labs/ov per 10/07 POF, gave pt AVS.... KJ °

## 2014-08-27 NOTE — Progress Notes (Signed)
[facility] Telephone:([phone]   Fax:(336) [place], [place] [address]  DIAGNOSIS: Myeloproliferative disorder with positive JAK-2 mutation.  PRIOR THERAPY: None.  CURRENT THERAPY: Hydrea 500 mg by mouth daily. Status post approximately 3 months of treatment.   INTERVAL HISTORY: Adam Tapia 80 y.o. male returns to the clinic today for followup visit. The patient is tolerating his current treatment with hydroxyurea fairly well with no significant adverse effects He denied having any significant chest pain, shortness of breath but has mild cough with no hemoptysis. He has no significant weight loss or night sweats. He has no nausea or vomiting. He has repeat CBC performed earlier today and he is here for evaluation and discussion of his lab results.  ALLERGIES:  has No Known Allergies.  MEDICATIONS:  Current Outpatient Prescriptions  Medication Sig Dispense Refill  . amLODipine (NORVASC) 5 MG tablet Take 5 mg by mouth 2 (two) times daily.       Marland Kitchen atenolol (TENORMIN) 50 MG tablet Take 25 mg by mouth daily.       . digoxin (LANOXIN) 0.25 MG tablet Take 0.25 mg by mouth daily.      . fexofenadine (ALLEGRA) 180 MG tablet Take 180 mg by mouth daily.      . fluticasone (FLONASE) 50 MCG/ACT nasal spray       . hydroxyurea (HYDREA) 500 MG capsule Take 1 capsule (500 mg total) by mouth daily. May take with food to minimize GI side effects.  30 capsule  2  . ketotifen (ZADITOR) 0.025 % ophthalmic solution 1 drop 2 (two) times daily.      Marland Kitchen losartan (COZAAR) 25 MG tablet Take 25 mg by mouth daily.      Marland Kitchen triamterene-hydrochlorothiazide (MAXZIDE-25) 37.5-25 MG per tablet Take 1 tablet by mouth daily. Pt states he takes 1/2 tablet daily       No current facility-administered medications for this visit.    REVIEW OF SYSTEMS:  A comprehensive review of systems was negative except for:  Constitutional: positive for fatigue   PHYSICAL EXAMINATION: General appearance: alert, cooperative, fatigued and no distress Head: Normocephalic, without obvious abnormality, atraumatic Neck: no adenopathy Lymph nodes: Cervical, supraclavicular, and axillary nodes normal. Resp: clear to auscultation bilaterally Cardio: regular rate and rhythm, S1, S2 normal, no murmur, click, rub or gallop GI: soft, non-tender; bowel sounds normal; no masses,  no organomegaly Extremities: extremities normal, atraumatic, no cyanosis or edema  ECOG PERFORMANCE STATUS: 1 - Symptomatic but completely ambulatory  Blood pressure 131/68, pulse 48, temperature 98.1 F (36.7 C), temperature source Oral, resp. rate 18, height 5\' 8"  (1.727 m), weight 183 lb 11.2 oz (83.326 kg), SpO2 100.00%.  LABORATORY DATA: Lab Results  Component Value Date   WBC 16.7* 08/27/2014   HGB 14.3 08/27/2014   HCT 42.1 08/27/2014   MCV 101.0* 08/27/2014   PLT 367 08/27/2014      Chemistry      Component Value Date/Time   NA 140 08/27/2014 1034   K 4.2 08/27/2014 1034   CO2 28 08/27/2014 1034   BUN 20.0 08/27/2014 1034   CREATININE 1.2 08/27/2014 1034      Component Value Date/Time   CALCIUM 10.3 08/27/2014 1034   ALKPHOS 100 08/27/2014 1034   AST 17 08/27/2014 1034   ALT 16 08/27/2014 1034   BILITOT 0.54 08/27/2014 1034       RADIOGRAPHIC STUDIES: No results  found.  ASSESSMENT AND PLAN: Very pleasant 80 years old [place] American male with persistent leukocytosis secondary to myeloproliferative disorder with posterior JAK-2 mutation. He is currently undergoing treatment with hydroxyurea 500 mg by mouth daily and tolerating his treatment fairly well. There is significant improvement in the total white blood count as well as platelets count after starting the treatment. I recommended for him to continue on the same treatment for now. I would see him back for followup visit in 2 months with repeat CBC, comprehensive metabolic  panel and LDH. He was advised to call immediately if she has any concerning symptoms in the interval. The patient voices understanding of current disease status and treatment options and is in agreement with the current care plan.  All questions were answered. The patient knows to call the clinic with any problems, questions or concerns. We can certainly see the patient much sooner if necessary.  Disclaimer: This note was dictated with voice recognition software. Similar sounding words can inadvertently be transcribed and may not be corrected upon review.

## 2014-09-19 ENCOUNTER — Other Ambulatory Visit: Payer: Self-pay | Admitting: Physician Assistant

## 2014-09-19 DIAGNOSIS — D72829 Elevated white blood cell count, unspecified: Secondary | ICD-10-CM

## 2014-10-27 ENCOUNTER — Other Ambulatory Visit (HOSPITAL_BASED_OUTPATIENT_CLINIC_OR_DEPARTMENT_OTHER): Payer: Medicare Other

## 2014-10-27 ENCOUNTER — Ambulatory Visit (HOSPITAL_BASED_OUTPATIENT_CLINIC_OR_DEPARTMENT_OTHER): Payer: Medicare Other | Admitting: Internal Medicine

## 2014-10-27 ENCOUNTER — Encounter: Payer: Self-pay | Admitting: Internal Medicine

## 2014-10-27 ENCOUNTER — Telehealth: Payer: Self-pay | Admitting: Internal Medicine

## 2014-10-27 VITALS — BP 152/67 | HR 48 | Temp 98.1°F | Resp 18 | Ht 68.0 in | Wt 185.9 lb

## 2014-10-27 DIAGNOSIS — D72829 Elevated white blood cell count, unspecified: Secondary | ICD-10-CM

## 2014-10-27 DIAGNOSIS — R05 Cough: Secondary | ICD-10-CM

## 2014-10-27 DIAGNOSIS — D471 Chronic myeloproliferative disease: Secondary | ICD-10-CM

## 2014-10-27 LAB — COMPREHENSIVE METABOLIC PANEL (CC13)
ALBUMIN: 3.5 g/dL (ref 3.5–5.0)
ALK PHOS: 94 U/L (ref 40–150)
ALT: 18 U/L (ref 0–55)
AST: 20 U/L (ref 5–34)
Anion Gap: 9 mEq/L (ref 3–11)
BUN: 18.9 mg/dL (ref 7.0–26.0)
CO2: 26 mEq/L (ref 22–29)
Calcium: 10.3 mg/dL (ref 8.4–10.4)
Chloride: 106 mEq/L (ref 98–109)
Creatinine: 1.3 mg/dL (ref 0.7–1.3)
EGFR: 48 mL/min/{1.73_m2} — ABNORMAL LOW (ref >90)
Glucose: 102 mg/dl (ref 70–140)
Potassium: 4.5 mEq/L (ref 3.5–5.1)
SODIUM: 141 meq/L (ref 136–145)
Total Bilirubin: 0.56 mg/dL (ref 0.20–1.20)
Total Protein: 7.8 g/dL (ref 6.4–8.3)

## 2014-10-27 LAB — CBC WITH DIFFERENTIAL/PLATELET
BASO%: 0.3 % (ref 0.0–2.0)
Basophils Absolute: 0.1 10*3/uL (ref 0.0–0.1)
EOS%: 1.1 % (ref 0.0–7.0)
Eosinophils Absolute: 0.2 10*3/uL (ref 0.0–0.5)
HCT: 43.3 % (ref 38.4–49.9)
HGB: 14.3 g/dL (ref 13.0–17.1)
LYMPH%: 19.2 % (ref 14.0–49.0)
MCH: 35.5 pg — ABNORMAL HIGH (ref 27.2–33.4)
MCHC: 33 g/dL (ref 32.0–36.0)
MCV: 107.4 fL — AB (ref 79.3–98.0)
MONO#: 1.5 10*3/uL — ABNORMAL HIGH (ref 0.1–0.9)
MONO%: 8.3 % (ref 0.0–14.0)
NEUT%: 71.1 % (ref 39.0–75.0)
NEUTROS ABS: 12.6 10*3/uL — AB (ref 1.5–6.5)
Platelets: 387 10*3/uL (ref 140–400)
RBC: 4.03 10*6/uL — AB (ref 4.20–5.82)
RDW: 15.1 % — ABNORMAL HIGH (ref 11.0–14.6)
WBC: 17.7 10*3/uL — ABNORMAL HIGH (ref 4.0–10.3)
lymph#: 3.4 10*3/uL — ABNORMAL HIGH (ref 0.9–3.3)

## 2014-10-27 LAB — LACTATE DEHYDROGENASE (CC13): LDH: 182 U/L (ref 125–245)

## 2014-10-27 NOTE — Telephone Encounter (Signed)
Pt confirmed labs/ov per 12/07 POF, gave pt AVS.... KJ

## 2014-10-27 NOTE — Progress Notes (Signed)
[place] Telephone:(336) [phone]   Fax:(336) [place], [place] [address]  DIAGNOSIS: Myeloproliferative disorder with positive JAK-2 mutation.  PRIOR THERAPY: None.  CURRENT THERAPY: Hydrea 500 mg by mouth daily. Status post approximately 5 months of treatment.   INTERVAL HISTORY: Adam Tapia 80 y.o. male returns to the clinic today for followup visit. He is currently on treatment with hydroxyurea 500 mg by mouth daily. The patient is tolerating his current treatment with hydroxyurea fairly well with no significant adverse effects. He denied having any significant chest pain, shortness of breath but has mild cough with no hemoptysis. He has no significant weight loss or night sweats. He has no nausea or vomiting. He has repeat CBC performed earlier today and he is here for evaluation and discussion of his lab results.  ALLERGIES:  has No Known Allergies.  MEDICATIONS:  Current Outpatient Prescriptions  Medication Sig Dispense Refill  . amLODipine (NORVASC) 5 MG tablet Take 5 mg by mouth 2 (two) times daily.     Marland Kitchen atenolol (TENORMIN) 50 MG tablet Take 25 mg by mouth daily.     . digoxin (LANOXIN) 0.25 MG tablet Take 0.25 mg by mouth daily.    . fexofenadine (ALLEGRA) 180 MG tablet Take 180 mg by mouth daily.    . fluticasone (FLONASE) 50 MCG/ACT nasal spray     . hydroxyurea (HYDREA) 500 MG capsule TAKE 1 CAPSULE (500 MG TOTAL) BY MOUTH DAILY. MAY TAKE WITH FOOD TO MINIMIZE GI SIDE EFFECTS. 30 capsule 1  . ketotifen (ZADITOR) 0.025 % ophthalmic solution 1 drop 2 (two) times daily.    Marland Kitchen losartan (COZAAR) 25 MG tablet Take 25 mg by mouth daily.    Marland Kitchen triamterene-hydrochlorothiazide (MAXZIDE-25) 37.5-25 MG per tablet Take 1 tablet by mouth daily. Pt states he takes 1/2 tablet daily     No current facility-administered medications for this visit.    REVIEW OF SYSTEMS:  A comprehensive  review of systems was negative.   PHYSICAL EXAMINATION: General appearance: alert, cooperative, fatigued and no distress Head: Normocephalic, without obvious abnormality, atraumatic Neck: no adenopathy Lymph nodes: Cervical, supraclavicular, and axillary nodes normal. Resp: clear to auscultation bilaterally Cardio: regular rate and rhythm, S1, S2 normal, no murmur, click, rub or gallop GI: soft, non-tender; bowel sounds normal; no masses,  no organomegaly Extremities: extremities normal, atraumatic, no cyanosis or edema  ECOG PERFORMANCE STATUS: 1 - Symptomatic but completely ambulatory  Blood pressure 152/67, pulse 48, temperature 98.1 F (36.7 C), temperature source Oral, resp. rate 18, height 5\' 8"  (1.727 m), weight 185 lb 14.4 oz (84.324 kg).  LABORATORY DATA: Lab Results  Component Value Date   WBC 17.7* 10/27/2014   HGB 14.3 10/27/2014   HCT 43.3 10/27/2014   MCV 107.4* 10/27/2014   PLT 387 10/27/2014      Chemistry      Component Value Date/Time   NA 140 08/27/2014 1034   K 4.2 08/27/2014 1034   CO2 28 08/27/2014 1034   BUN 20.0 08/27/2014 1034   CREATININE 1.2 08/27/2014 1034      Component Value Date/Time   CALCIUM 10.3 08/27/2014 1034   ALKPHOS 100 08/27/2014 1034   AST 17 08/27/2014 1034   ALT 16 08/27/2014 1034   BILITOT 0.54 08/27/2014 1034       RADIOGRAPHIC STUDIES: No results found.  ASSESSMENT AND PLAN: Very pleasant 80 years old [place] American male with  persistent leukocytosis secondary to myeloproliferative disorder with posterior JAK-2 mutation. He is currently undergoing treatment with hydroxyurea 500 mg by mouth daily and tolerating his treatment fairly well. His CBC showed a stable white blood count. I recommended for him to continue on the same treatment for now. He will have monthly CBC and comprehensive metabolic panel. I would see him back for followup visit in 3 months with repeat CBC, comprehensive metabolic panel. He was advised to  call immediately if she has any concerning symptoms in the interval. The patient voices understanding of current disease status and treatment options and is in agreement with the current care plan.  All questions were answered. The patient knows to call the clinic with any problems, questions or concerns. We can certainly see the patient much sooner if necessary.  Disclaimer: This note was dictated with voice recognition software. Similar sounding words can inadvertently be transcribed and may not be corrected upon review.

## 2014-11-25 ENCOUNTER — Other Ambulatory Visit: Payer: Self-pay | Admitting: Physician Assistant

## 2014-11-25 ENCOUNTER — Other Ambulatory Visit: Payer: Self-pay | Admitting: *Deleted

## 2014-11-25 DIAGNOSIS — D72829 Elevated white blood cell count, unspecified: Secondary | ICD-10-CM

## 2014-11-25 MED ORDER — HYDROXYUREA 500 MG PO CAPS: 500.0000 mg | ORAL_CAPSULE | Freq: Every day | ORAL | Status: DC

## 2014-11-27 ENCOUNTER — Ambulatory Visit: Payer: Medicare Other | Admitting: Internal Medicine

## 2014-11-27 ENCOUNTER — Other Ambulatory Visit: Payer: Medicare Other

## 2015-01-26 ENCOUNTER — Ambulatory Visit (HOSPITAL_BASED_OUTPATIENT_CLINIC_OR_DEPARTMENT_OTHER): Payer: Medicare Other | Admitting: Internal Medicine

## 2015-01-26 ENCOUNTER — Other Ambulatory Visit (HOSPITAL_BASED_OUTPATIENT_CLINIC_OR_DEPARTMENT_OTHER): Payer: Medicare Other

## 2015-01-26 ENCOUNTER — Telehealth: Payer: Self-pay | Admitting: Internal Medicine

## 2015-01-26 ENCOUNTER — Encounter: Payer: Self-pay | Admitting: Internal Medicine

## 2015-01-26 VITALS — BP 138/57 | HR 52 | Temp 98.3°F | Resp 18 | Ht 68.0 in | Wt 192.6 lb

## 2015-01-26 DIAGNOSIS — D47Z9 Other specified neoplasms of uncertain behavior of lymphoid, hematopoietic and related tissue: Secondary | ICD-10-CM

## 2015-01-26 DIAGNOSIS — D471 Chronic myeloproliferative disease: Secondary | ICD-10-CM

## 2015-01-26 DIAGNOSIS — D72829 Elevated white blood cell count, unspecified: Secondary | ICD-10-CM

## 2015-01-26 LAB — CBC WITH DIFFERENTIAL/PLATELET
BASO%: 0.6 % (ref 0.0–2.0)
Basophils Absolute: 0.1 10*3/uL (ref 0.0–0.1)
EOS ABS: 0.2 10*3/uL (ref 0.0–0.5)
EOS%: 1.1 % (ref 0.0–7.0)
HCT: 43.6 % (ref 38.4–49.9)
HGB: 14.4 g/dL (ref 13.0–17.1)
LYMPH#: 3.1 10*3/uL (ref 0.9–3.3)
LYMPH%: 17.7 % (ref 14.0–49.0)
MCH: 35.7 pg — ABNORMAL HIGH (ref 27.2–33.4)
MCHC: 33.1 g/dL (ref 32.0–36.0)
MCV: 108 fL — ABNORMAL HIGH (ref 79.3–98.0)
MONO#: 1.6 10*3/uL — ABNORMAL HIGH (ref 0.1–0.9)
MONO%: 9 % (ref 0.0–14.0)
NEUT%: 71.6 % (ref 39.0–75.0)
NEUTROS ABS: 12.5 10*3/uL — AB (ref 1.5–6.5)
Platelets: 378 10*3/uL (ref 140–400)
RBC: 4.03 10*6/uL — ABNORMAL LOW (ref 4.20–5.82)
RDW: 13.8 % (ref 11.0–14.6)
WBC: 17.5 10*3/uL — ABNORMAL HIGH (ref 4.0–10.3)

## 2015-01-26 LAB — COMPREHENSIVE METABOLIC PANEL (CC13)
ALT: 12 U/L (ref 0–55)
ANION GAP: 9 meq/L (ref 3–11)
AST: 19 U/L (ref 5–34)
Albumin: 3.5 g/dL (ref 3.5–5.0)
Alkaline Phosphatase: 96 U/L (ref 40–150)
BILIRUBIN TOTAL: 0.53 mg/dL (ref 0.20–1.20)
BUN: 21.1 mg/dL (ref 7.0–26.0)
CO2: 24 meq/L (ref 22–29)
Calcium: 9.9 mg/dL (ref 8.4–10.4)
Chloride: 104 mEq/L (ref 98–109)
Creatinine: 1.3 mg/dL (ref 0.7–1.3)
EGFR: 50 mL/min/{1.73_m2} — ABNORMAL LOW (ref >90)
GLUCOSE: 112 mg/dL (ref 70–140)
Potassium: 4.1 mEq/L (ref 3.5–5.1)
Sodium: 138 mEq/L (ref 136–145)
Total Protein: 7.4 g/dL (ref 6.4–8.3)

## 2015-01-26 MED ORDER — HYDROXYUREA 500 MG PO CAPS: 500.0000 mg | ORAL_CAPSULE | Freq: Every day | ORAL | Status: DC

## 2015-01-26 NOTE — Progress Notes (Signed)
[place] Telephone:([phone]   Fax:(336) [place], [place] [address]  DIAGNOSIS: Myeloproliferative disorder with positive JAK-2 mutation.  PRIOR THERAPY: None.  CURRENT THERAPY: Hydrea 500 mg by mouth daily. Status post approximately 5 months of treatment.   INTERVAL HISTORY: Adam Tapia 81 y.o. male returns to the clinic today for followup visit. He is currently on treatment with hydroxyurea 500 mg by mouth daily. The patient is tolerating his current treatment with hydroxyurea fairly well with no significant adverse effects. He denied having any significant chest pain, shortness of breath, cough or hemoptysis. He has no significant weight loss or night sweats. He has no nausea or vomiting. He has repeat CBC performed earlier today and he is here for evaluation and discussion of his lab results.  ALLERGIES:  has No Known Allergies.  MEDICATIONS:  Current Outpatient Prescriptions  Medication Sig Dispense Refill  . amLODipine (NORVASC) 5 MG tablet Take 5 mg by mouth 2 (two) times daily.     Marland Kitchen atenolol (TENORMIN) 50 MG tablet Take 25 mg by mouth daily.     . digoxin (LANOXIN) 0.25 MG tablet Take 0.25 mg by mouth daily.    . fexofenadine (ALLEGRA) 180 MG tablet Take 180 mg by mouth daily.    . fluticasone (FLONASE) 50 MCG/ACT nasal spray     . hydroxyurea (HYDREA) 500 MG capsule Take 1 capsule (500 mg total) by mouth daily. May take with food to minimize GI side effects. 30 capsule 2  . ketotifen (ZADITOR) 0.025 % ophthalmic solution 1 drop 2 (two) times daily.    Marland Kitchen losartan (COZAAR) 25 MG tablet Take 25 mg by mouth daily.    Marland Kitchen triamterene-hydrochlorothiazide (MAXZIDE-25) 37.5-25 MG per tablet Take 1 tablet by mouth daily. Pt states he takes 1/2 tablet daily     No current facility-administered medications for this visit.    REVIEW OF SYSTEMS:  A comprehensive review of systems was  negative.   PHYSICAL EXAMINATION: General appearance: alert, cooperative, fatigued and no distress Head: Normocephalic, without obvious abnormality, atraumatic Neck: no adenopathy Lymph nodes: Cervical, supraclavicular, and axillary nodes normal. Resp: clear to auscultation bilaterally Cardio: regular rate and rhythm, S1, S2 normal, no murmur, click, rub or gallop GI: soft, non-tender; bowel sounds normal; no masses,  no organomegaly Extremities: extremities normal, atraumatic, no cyanosis or edema  ECOG PERFORMANCE STATUS: 1 - Symptomatic but completely ambulatory  Blood pressure 138/57, pulse 52, temperature 98.3 F (36.8 C), temperature source Oral, resp. rate 18, height 5\' 8"  (1.727 m), weight 192 lb 9.6 oz (87.363 kg), SpO2 97 %.  LABORATORY DATA: Lab Results  Component Value Date   WBC 17.5* 01/26/2015   HGB 14.4 01/26/2015   HCT 43.6 01/26/2015   MCV 108.0* 01/26/2015   PLT 378 01/26/2015      Chemistry      Component Value Date/Time   NA 138 01/26/2015 0755   K 4.1 01/26/2015 0755   CO2 24 01/26/2015 0755   BUN 21.1 01/26/2015 0755   CREATININE 1.3 01/26/2015 0755      Component Value Date/Time   CALCIUM 9.9 01/26/2015 0755   ALKPHOS 96 01/26/2015 0755   AST 19 01/26/2015 0755   ALT 12 01/26/2015 0755   BILITOT 0.53 01/26/2015 0755       RADIOGRAPHIC STUDIES: No results found.  ASSESSMENT AND PLAN: Very pleasant 81 years old [place] American male with persistent  leukocytosis secondary to myeloproliferative disorder with posterior JAK-2 mutation. He is currently undergoing treatment with hydroxyurea 500 mg by mouth daily and tolerating his treatment fairly well. His CBC showed a stable white blood count. History question about the nature of his disease and I answered his question completely to his satisfaction I recommended for him to continue on the same treatment for now. He will have monthly CBC and comprehensive metabolic panel. I would see him back  for followup visit in 3 months with repeat CBC, comprehensive metabolic panel. He was advised to call immediately if she has any concerning symptoms in the interval. The patient voices understanding of current disease status and treatment options and is in agreement with the current care plan.  All questions were answered. The patient knows to call the clinic with any problems, questions or concerns. We can certainly see the patient much sooner if necessary.  Disclaimer: This note was dictated with voice recognition software. Similar sounding words can inadvertently be transcribed and may not be corrected upon review.

## 2015-01-26 NOTE — Telephone Encounter (Signed)
Pt confirmed labs/ov per 03/07 POF, gave pt AVS... KJ  °

## 2015-04-29 ENCOUNTER — Telehealth: Payer: Self-pay | Admitting: Internal Medicine

## 2015-04-29 ENCOUNTER — Ambulatory Visit (HOSPITAL_BASED_OUTPATIENT_CLINIC_OR_DEPARTMENT_OTHER): Payer: Medicare Other | Admitting: Internal Medicine

## 2015-04-29 ENCOUNTER — Encounter: Payer: Self-pay | Admitting: Internal Medicine

## 2015-04-29 ENCOUNTER — Other Ambulatory Visit (HOSPITAL_BASED_OUTPATIENT_CLINIC_OR_DEPARTMENT_OTHER): Payer: Medicare Other

## 2015-04-29 VITALS — BP 138/70 | HR 51 | Temp 98.0°F | Resp 18 | Ht 68.0 in | Wt 182.3 lb

## 2015-04-29 DIAGNOSIS — D72829 Elevated white blood cell count, unspecified: Secondary | ICD-10-CM

## 2015-04-29 DIAGNOSIS — D471 Chronic myeloproliferative disease: Secondary | ICD-10-CM

## 2015-04-29 DIAGNOSIS — D47Z9 Other specified neoplasms of uncertain behavior of lymphoid, hematopoietic and related tissue: Secondary | ICD-10-CM

## 2015-04-29 LAB — COMPREHENSIVE METABOLIC PANEL (CC13)
ALT: 9 U/L (ref 0–55)
ANION GAP: 7 meq/L (ref 3–11)
AST: 20 U/L (ref 5–34)
Albumin: 3.5 g/dL (ref 3.5–5.0)
Alkaline Phosphatase: 108 U/L (ref 40–150)
BUN: 23.6 mg/dL (ref 7.0–26.0)
CALCIUM: 9.9 mg/dL (ref 8.4–10.4)
CHLORIDE: 109 meq/L (ref 98–109)
CO2: 23 mEq/L (ref 22–29)
Creatinine: 1.2 mg/dL (ref 0.7–1.3)
EGFR: 51 mL/min/{1.73_m2} — AB (ref >90)
GLUCOSE: 99 mg/dL (ref 70–140)
POTASSIUM: 3.9 meq/L (ref 3.5–5.1)
Sodium: 139 mEq/L (ref 136–145)
Total Bilirubin: 0.25 mg/dL (ref 0.20–1.20)
Total Protein: 7.8 g/dL (ref 6.4–8.3)

## 2015-04-29 LAB — CBC WITH DIFFERENTIAL/PLATELET
BASO%: 0.3 % (ref 0.0–2.0)
BASOS ABS: 0.1 10*3/uL (ref 0.0–0.1)
EOS%: 1.1 % (ref 0.0–7.0)
Eosinophils Absolute: 0.3 10*3/uL (ref 0.0–0.5)
HCT: 41.8 % (ref 38.4–49.9)
HEMOGLOBIN: 14 g/dL (ref 13.0–17.1)
LYMPH%: 11.6 % — ABNORMAL LOW (ref 14.0–49.0)
MCH: 35.8 pg — AB (ref 27.2–33.4)
MCHC: 33.4 g/dL (ref 32.0–36.0)
MCV: 107.1 fL — ABNORMAL HIGH (ref 79.3–98.0)
MONO#: 1.6 10*3/uL — ABNORMAL HIGH (ref 0.1–0.9)
MONO%: 7 % (ref 0.0–14.0)
NEUT#: 18.1 10*3/uL — ABNORMAL HIGH (ref 1.5–6.5)
NEUT%: 80 % — ABNORMAL HIGH (ref 39.0–75.0)
Platelets: 387 10*3/uL (ref 140–400)
RBC: 3.9 10*6/uL — AB (ref 4.20–5.82)
RDW: 14.4 % (ref 11.0–14.6)
WBC: 22.6 10*3/uL — ABNORMAL HIGH (ref 4.0–10.3)
lymph#: 2.6 10*3/uL (ref 0.9–3.3)

## 2015-04-29 LAB — LACTATE DEHYDROGENASE (CC13): LDH: 212 U/L (ref 125–245)

## 2015-04-29 NOTE — Progress Notes (Signed)
[place] [phone]   Fax:(336) [place], [place] [phone]  DIAGNOSIS: Myeloproliferative disorder with positive JAK-2 mutation.  PRIOR THERAPY: None.  CURRENT THERAPY: Hydrea 500 mg by mouth daily. Status post approximately 5 months of treatment.   INTERVAL HISTORY: Adam Tapia 81 y.o. male returns to the clinic today for followup visit. He is currently on treatment with hydroxyurea 500 mg by mouth daily and tolerating it fairly well with no significant adverse effects. He denied having any significant chest pain, shortness of breath, cough or hemoptysis. He has no significant weight loss or night sweats. He has no nausea or vomiting. He has repeat CBC performed earlier today and he is here for evaluation and discussion of his lab results.  ALLERGIES:  has No Known Allergies.  MEDICATIONS:  Current Outpatient Prescriptions  Medication Sig Dispense Refill  . amLODipine (NORVASC) 5 MG tablet Take 5 mg by mouth 2 (two) times daily.     Marland Kitchen atenolol (TENORMIN) 50 MG tablet Take 25 mg by mouth daily.     . digoxin (LANOXIN) 0.25 MG tablet Take 0.25 mg by mouth daily.    . fexofenadine (ALLEGRA) 180 MG tablet Take 180 mg by mouth daily.    . fluticasone (FLONASE) 50 MCG/ACT nasal spray     . hydroxyurea (HYDREA) 500 MG capsule Take 1 capsule (500 mg total) by mouth daily. May take with food to minimize GI side effects. 90 capsule 1  . ketotifen (ZADITOR) 0.025 % ophthalmic solution 1 drop 2 (two) times daily.    Marland Kitchen losartan (COZAAR) 25 MG tablet Take 25 mg by mouth daily.    Marland Kitchen triamterene-hydrochlorothiazide (MAXZIDE-25) 37.5-25 MG per tablet Take 1 tablet by mouth daily. Pt states he takes 1/2 tablet daily     No current facility-administered medications for this visit.    REVIEW OF SYSTEMS:  A comprehensive review of systems was negative.   PHYSICAL EXAMINATION: General  appearance: alert, cooperative, fatigued and no distress Head: Normocephalic, without obvious abnormality, atraumatic Neck: no adenopathy Lymph nodes: Cervical, supraclavicular, and axillary nodes normal. Resp: clear to auscultation bilaterally Cardio: regular rate and rhythm, S1, S2 normal, no murmur, click, rub or gallop GI: soft, non-tender; bowel sounds normal; no masses,  no organomegaly Extremities: extremities normal, atraumatic, no cyanosis or edema  ECOG PERFORMANCE STATUS: 1 - Symptomatic but completely ambulatory  Blood pressure 138/70, pulse 51, temperature 98 F (36.7 C), temperature source Oral, resp. rate 18, height 5\' 8"  (1.727 m), weight 182 lb 4.8 oz (82.691 kg), SpO2 100 %.  LABORATORY DATA: Lab Results  Component Value Date   WBC 22.6* 04/29/2015   HGB 14.0 04/29/2015   HCT 41.8 04/29/2015   MCV 107.1* 04/29/2015   PLT 387 04/29/2015      Chemistry      Component Value Date/Time   NA 138 01/26/2015 0755   K 4.1 01/26/2015 0755   CO2 24 01/26/2015 0755   BUN 21.1 01/26/2015 0755   CREATININE 1.3 01/26/2015 0755      Component Value Date/Time   CALCIUM 9.9 01/26/2015 0755   ALKPHOS 96 01/26/2015 0755   AST 19 01/26/2015 0755   ALT 12 01/26/2015 0755   BILITOT 0.53 01/26/2015 0755       RADIOGRAPHIC STUDIES: No results found.  ASSESSMENT AND PLAN: Very pleasant 81 years old [place] American male with persistent leukocytosis secondary to myeloproliferative disorder with  posterior JAK-2 mutation. He is currently undergoing treatment with hydroxyurea 500 mg by mouth daily and tolerating his treatment fairly well. His CBC showed increase in the total white blood count but no significant change in his hemoglobin, hematocrit and platelets count.. I recommended for him to continue on the same treatment for now. I would see him back for followup visit in 3 months with repeat CBC, comprehensive metabolic pane and LDH. He was advised to call immediately if  she has any concerning symptoms in the interval. The patient voices understanding of current disease status and treatment options and is in agreement with the current care plan.  All questions were answered. The patient knows to call the clinic with any problems, questions or concerns. We can certainly see the patient much sooner if necessary.  Disclaimer: This note was dictated with voice recognition software. Similar sounding words can inadvertently be transcribed and may not be corrected upon review.

## 2015-04-29 NOTE — Telephone Encounter (Signed)
per pof to sch pt appt-gave pt avs °

## 2015-07-30 ENCOUNTER — Encounter: Payer: Self-pay | Admitting: Internal Medicine

## 2015-07-30 ENCOUNTER — Other Ambulatory Visit (HOSPITAL_BASED_OUTPATIENT_CLINIC_OR_DEPARTMENT_OTHER): Payer: Medicare Other

## 2015-07-30 ENCOUNTER — Telehealth: Payer: Self-pay | Admitting: Internal Medicine

## 2015-07-30 ENCOUNTER — Other Ambulatory Visit: Payer: Self-pay | Admitting: Internal Medicine

## 2015-07-30 ENCOUNTER — Ambulatory Visit (HOSPITAL_BASED_OUTPATIENT_CLINIC_OR_DEPARTMENT_OTHER): Payer: Medicare Other | Admitting: Internal Medicine

## 2015-07-30 VITALS — BP 141/69 | HR 54 | Temp 98.4°F | Resp 18 | Ht 68.0 in | Wt 180.1 lb

## 2015-07-30 DIAGNOSIS — D72829 Elevated white blood cell count, unspecified: Secondary | ICD-10-CM

## 2015-07-30 DIAGNOSIS — D471 Chronic myeloproliferative disease: Secondary | ICD-10-CM

## 2015-07-30 DIAGNOSIS — D47Z9 Other specified neoplasms of uncertain behavior of lymphoid, hematopoietic and related tissue: Secondary | ICD-10-CM | POA: Diagnosis not present

## 2015-07-30 LAB — CBC WITH DIFFERENTIAL/PLATELET
BASO%: 0.3 % (ref 0.0–2.0)
Basophils Absolute: 0.1 10*3/uL (ref 0.0–0.1)
EOS%: 1.1 % (ref 0.0–7.0)
Eosinophils Absolute: 0.2 10*3/uL (ref 0.0–0.5)
HCT: 41.8 % (ref 38.4–49.9)
HEMOGLOBIN: 14.2 g/dL (ref 13.0–17.1)
LYMPH%: 17 % (ref 14.0–49.0)
MCH: 36.9 pg — ABNORMAL HIGH (ref 27.2–33.4)
MCHC: 34 g/dL (ref 32.0–36.0)
MCV: 108.6 fL — ABNORMAL HIGH (ref 79.3–98.0)
MONO#: 1.6 10*3/uL — ABNORMAL HIGH (ref 0.1–0.9)
MONO%: 7.7 % (ref 0.0–14.0)
NEUT%: 73.9 % (ref 39.0–75.0)
NEUTROS ABS: 15.6 10*3/uL — AB (ref 1.5–6.5)
Platelets: 403 10*3/uL — ABNORMAL HIGH (ref 140–400)
RBC: 3.85 10*6/uL — AB (ref 4.20–5.82)
RDW: 14.6 % (ref 11.0–14.6)
WBC: 21 10*3/uL — ABNORMAL HIGH (ref 4.0–10.3)
lymph#: 3.6 10*3/uL — ABNORMAL HIGH (ref 0.9–3.3)

## 2015-07-30 LAB — COMPREHENSIVE METABOLIC PANEL (CC13)
ALBUMIN: 3.6 g/dL (ref 3.5–5.0)
ALT: 12 U/L (ref 0–55)
AST: 17 U/L (ref 5–34)
Alkaline Phosphatase: 104 U/L (ref 40–150)
Anion Gap: 9 mEq/L (ref 3–11)
BILIRUBIN TOTAL: 0.62 mg/dL (ref 0.20–1.20)
BUN: 21.8 mg/dL (ref 7.0–26.0)
CO2: 24 mEq/L (ref 22–29)
CREATININE: 1.3 mg/dL (ref 0.7–1.3)
Calcium: 10 mg/dL (ref 8.4–10.4)
Chloride: 107 mEq/L (ref 98–109)
EGFR: 50 mL/min/{1.73_m2} — AB (ref >90)
GLUCOSE: 97 mg/dL (ref 70–140)
Potassium: 4.3 mEq/L (ref 3.5–5.1)
SODIUM: 140 meq/L (ref 136–145)
TOTAL PROTEIN: 8 g/dL (ref 6.4–8.3)

## 2015-07-30 LAB — LACTATE DEHYDROGENASE (CC13): LDH: 193 U/L (ref 125–245)

## 2015-07-30 NOTE — Telephone Encounter (Signed)
per pof to sch pt appt-gave pt copy of avs °

## 2015-07-30 NOTE — Progress Notes (Signed)
[place] [phone]   Fax:(336) [place], [place] [address]  DIAGNOSIS: Myeloproliferative disorder with positive JAK-2 mutation.  PRIOR THERAPY: None.  CURRENT THERAPY: Hydrea 500 mg by mouth daily. Status post approximately 8 months of treatment.   INTERVAL HISTORY: Adam Tapia 81 y.o. male returns to the clinic today for three-month followup visit. He is currently on treatment with hydroxyurea 500 mg by mouth daily and tolerating it fairly well with no significant adverse effects. He complains of some arthritis in the left knee. He denied having any significant chest pain, shortness of breath, cough or hemoptysis. He has no significant weight loss or night sweats. He has no nausea or vomiting. He has repeat CBC performed earlier today and he is here for evaluation and discussion of his lab results.  ALLERGIES:  has No Known Allergies.  MEDICATIONS:  Current Outpatient Prescriptions  Medication Sig Dispense Refill  . amLODipine (NORVASC) 5 MG tablet Take 5 mg by mouth 2 (two) times daily.     Marland Kitchen atenolol (TENORMIN) 25 MG tablet Take 25 mg by mouth daily.  5  . digoxin (LANOXIN) 0.25 MG tablet Take 0.25 mg by mouth daily.    . fexofenadine (ALLEGRA) 180 MG tablet Take 180 mg by mouth daily.    . fluticasone (FLONASE) 50 MCG/ACT nasal spray     . hydroxyurea (HYDREA) 500 MG capsule Take 1 capsule (500 mg total) by mouth daily. May take with food to minimize GI side effects. 90 capsule 1  . ketotifen (ZADITOR) 0.025 % ophthalmic solution 1 drop 2 (two) times daily.    Marland Kitchen losartan (COZAAR) 50 MG tablet Take 50 mg by mouth every morning.  1  . triamterene-hydrochlorothiazide (MAXZIDE-25) 37.5-25 MG per tablet Take 1 tablet by mouth daily. Pt states he takes 1/2 tablet daily     No current facility-administered medications for this visit.    REVIEW OF SYSTEMS:  A comprehensive  review of systems was negative.   PHYSICAL EXAMINATION: General appearance: alert, cooperative, fatigued and no distress Head: Normocephalic, without obvious abnormality, atraumatic Neck: no adenopathy Lymph nodes: Cervical, supraclavicular, and axillary nodes normal. Resp: clear to auscultation bilaterally Cardio: regular rate and rhythm, S1, S2 normal, no murmur, click, rub or gallop GI: soft, non-tender; bowel sounds normal; no masses,  no organomegaly Extremities: extremities normal, atraumatic, no cyanosis or edema  ECOG PERFORMANCE STATUS: 1 - Symptomatic but completely ambulatory  Blood pressure 141/69, pulse 54, temperature 98.4 F (36.9 C), temperature source Oral, resp. rate 18, height 5\' 8"  (1.727 m), weight 180 lb 1.6 oz (81.693 kg), SpO2 100 %.  LABORATORY DATA: Lab Results  Component Value Date   WBC 21.0* 07/30/2015   HGB 14.2 07/30/2015   HCT 41.8 07/30/2015   MCV 108.6* 07/30/2015   PLT 403* 07/30/2015      Chemistry      Component Value Date/Time   NA 139 04/29/2015 0808   K 3.9 04/29/2015 0808   CO2 23 04/29/2015 0808   BUN 23.6 04/29/2015 0808   CREATININE 1.2 04/29/2015 0808      Component Value Date/Time   CALCIUM 9.9 04/29/2015 0808   ALKPHOS 108 04/29/2015 0808   AST 20 04/29/2015 0808   ALT 9 04/29/2015 0808   BILITOT 0.25 04/29/2015 0808       RADIOGRAPHIC STUDIES: No results found.  ASSESSMENT AND PLAN: Very pleasant 81 years old African  American male with persistent leukocytosis secondary to myeloproliferative disorder with positive JAK-2 mutation. He is currently undergoing treatment with hydroxyurea 500 mg by mouth daily and tolerating his treatment fairly well. His CBC showed persistent leukocytosis and platelets count are within the acceptable range. I recommended for him to continue on the same treatment for now. I would see him back for followup visit in 3 months with repeat CBC, comprehensive metabolic pane and LDH. He was  advised to call immediately if she has any concerning symptoms in the interval. The patient voices understanding of current disease status and treatment options and is in agreement with the current care plan.  All questions were answered. The patient knows to call the clinic with any problems, questions or concerns. We can certainly see the patient much sooner if necessary.  Disclaimer: This note was dictated with voice recognition software. Similar sounding words can inadvertently be transcribed and may not be corrected upon review.

## 2015-10-29 ENCOUNTER — Ambulatory Visit (HOSPITAL_BASED_OUTPATIENT_CLINIC_OR_DEPARTMENT_OTHER): Payer: Medicare Other | Admitting: Internal Medicine

## 2015-10-29 ENCOUNTER — Other Ambulatory Visit (HOSPITAL_BASED_OUTPATIENT_CLINIC_OR_DEPARTMENT_OTHER): Payer: Medicare Other

## 2015-10-29 ENCOUNTER — Telehealth: Payer: Self-pay | Admitting: Internal Medicine

## 2015-10-29 ENCOUNTER — Encounter: Payer: Self-pay | Admitting: Internal Medicine

## 2015-10-29 VITALS — BP 144/73 | HR 62 | Temp 98.1°F | Resp 17 | Ht 68.0 in | Wt 178.5 lb

## 2015-10-29 DIAGNOSIS — D47Z9 Other specified neoplasms of uncertain behavior of lymphoid, hematopoietic and related tissue: Secondary | ICD-10-CM

## 2015-10-29 DIAGNOSIS — D72829 Elevated white blood cell count, unspecified: Secondary | ICD-10-CM

## 2015-10-29 DIAGNOSIS — D471 Chronic myeloproliferative disease: Secondary | ICD-10-CM

## 2015-10-29 LAB — CBC WITH DIFFERENTIAL/PLATELET
BASO%: 0.5 % (ref 0.0–2.0)
Basophils Absolute: 0.1 10*3/uL (ref 0.0–0.1)
EOS%: 1 % (ref 0.0–7.0)
Eosinophils Absolute: 0.2 10*3/uL (ref 0.0–0.5)
HCT: 42.3 % (ref 38.4–49.9)
HGB: 14.2 g/dL (ref 13.0–17.1)
LYMPH#: 2.9 10*3/uL (ref 0.9–3.3)
LYMPH%: 14 % (ref 14.0–49.0)
MCH: 36.1 pg — ABNORMAL HIGH (ref 27.2–33.4)
MCHC: 33.4 g/dL (ref 32.0–36.0)
MCV: 107.9 fL — AB (ref 79.3–98.0)
MONO#: 1.4 10*3/uL — ABNORMAL HIGH (ref 0.1–0.9)
MONO%: 7 % (ref 0.0–14.0)
NEUT%: 77.5 % — AB (ref 39.0–75.0)
NEUTROS ABS: 15.8 10*3/uL — AB (ref 1.5–6.5)
PLATELETS: 425 10*3/uL — AB (ref 140–400)
RBC: 3.92 10*6/uL — AB (ref 4.20–5.82)
RDW: 14.3 % (ref 11.0–14.6)
WBC: 20.4 10*3/uL — ABNORMAL HIGH (ref 4.0–10.3)

## 2015-10-29 LAB — COMPREHENSIVE METABOLIC PANEL
ALBUMIN: 3.6 g/dL (ref 3.5–5.0)
ALK PHOS: 106 U/L (ref 40–150)
ALT: 10 U/L (ref 0–55)
ANION GAP: 9 meq/L (ref 3–11)
AST: 16 U/L (ref 5–34)
BILIRUBIN TOTAL: 0.39 mg/dL (ref 0.20–1.20)
BUN: 22.7 mg/dL (ref 7.0–26.0)
CALCIUM: 10.3 mg/dL (ref 8.4–10.4)
CHLORIDE: 106 meq/L (ref 98–109)
CO2: 24 mEq/L (ref 22–29)
CREATININE: 1.1 mg/dL (ref 0.7–1.3)
EGFR: 56 mL/min/{1.73_m2} — ABNORMAL LOW (ref >90)
Glucose: 91 mg/dl (ref 70–140)
Potassium: 4.1 mEq/L (ref 3.5–5.1)
Sodium: 139 mEq/L (ref 136–145)
TOTAL PROTEIN: 8.1 g/dL (ref 6.4–8.3)

## 2015-10-29 LAB — LACTATE DEHYDROGENASE: LDH: 185 U/L (ref 125–245)

## 2015-10-29 NOTE — Telephone Encounter (Signed)
per pof to sch pt appt-gave ppt copy of avs

## 2015-10-29 NOTE — Progress Notes (Signed)
[facility] Telephone:([phone]   Fax:(336) [place], [place] [address]  DIAGNOSIS: Myeloproliferative disorder with positive JAK-2 mutation.  PRIOR THERAPY: None.  CURRENT THERAPY: Hydrea 500 mg by mouth daily. Status post approximately 11 months of treatment.   INTERVAL HISTORY: Adam Tapia 81 y.o. male returns to the clinic today for three-month followup visit. He is currently on treatment with hydroxyurea 500 mg by mouth daily and tolerating it fairly well with no significant adverse effects. He continues to complain of some arthritis in the left knee and he takes Tylenol for it. He denied having any significant chest pain, shortness of breath, cough or hemoptysis. He has no significant weight loss or night sweats. He has no nausea or vomiting. He has repeat CBC performed earlier today and he is here for evaluation and discussion of his lab results.  ALLERGIES:  has No Known Allergies.  MEDICATIONS:  Current Outpatient Prescriptions  Medication Sig Dispense Refill  . amLODipine (NORVASC) 5 MG tablet Take 5 mg by mouth 2 (two) times daily.     Marland Kitchen atenolol (TENORMIN) 25 MG tablet Take 25 mg by mouth daily.  5  . digoxin (LANOXIN) 0.25 MG tablet Take 0.25 mg by mouth daily.    . fexofenadine (ALLEGRA) 180 MG tablet Take 180 mg by mouth daily.    . fluticasone (FLONASE) 50 MCG/ACT nasal spray     . hydroxyurea (HYDREA) 500 MG capsule TAKE 1 CAPSULE (500 MG TOTAL) BY MOUTH DAILY. MAY TAKE WITH FOOD TO MINIMIZE GI SIDE EFFECTS. 90 capsule 1  . ketotifen (ZADITOR) 0.025 % ophthalmic solution 1 drop 2 (two) times daily.    Marland Kitchen losartan (COZAAR) 50 MG tablet Take 50 mg by mouth every morning.  1  . triamterene-hydrochlorothiazide (MAXZIDE-25) 37.5-25 MG per tablet Take 1 tablet by mouth daily. Pt states he takes 1/2 tablet daily     No current facility-administered medications for this visit.      REVIEW OF SYSTEMS:  A comprehensive review of systems was negative except for: Musculoskeletal: positive for arthralgias   PHYSICAL EXAMINATION: General appearance: alert, cooperative, fatigued and no distress Head: Normocephalic, without obvious abnormality, atraumatic Neck: no adenopathy Lymph nodes: Cervical, supraclavicular, and axillary nodes normal. Resp: clear to auscultation bilaterally Cardio: regular rate and rhythm, S1, S2 normal, no murmur, click, rub or gallop GI: soft, non-tender; bowel sounds normal; no masses,  no organomegaly Extremities: extremities normal, atraumatic, no cyanosis or edema  ECOG PERFORMANCE STATUS: 1 - Symptomatic but completely ambulatory  There were no vitals taken for this visit.  LABORATORY DATA: Lab Results  Component Value Date   WBC 20.4* 10/29/2015   HGB 14.2 10/29/2015   HCT 42.3 10/29/2015   MCV 107.9* 10/29/2015   PLT 425* 10/29/2015      Chemistry      Component Value Date/Time   NA 140 07/30/2015 0814   K 4.3 07/30/2015 0814   CO2 24 07/30/2015 0814   BUN 21.8 07/30/2015 0814   CREATININE 1.3 07/30/2015 0814      Component Value Date/Time   CALCIUM 10.0 07/30/2015 0814   ALKPHOS 104 07/30/2015 0814   AST 17 07/30/2015 0814   ALT 12 07/30/2015 0814   BILITOT 0.62 07/30/2015 0814       RADIOGRAPHIC STUDIES: No results found.  ASSESSMENT AND PLAN: Very pleasant 81 years old [place] American male with persistent leukocytosis secondary to myeloproliferative  disorder with positive JAK-2 mutation. He is currently undergoing treatment with hydroxyurea 500 mg by mouth daily and tolerating his treatment fairly well. His CBC showed persistent leukocytosis and platelets count are stable compared to the lab 3 months ago. I recommended for him to continue on the same treatment for now. I would see him back for followup visit in 3 months with repeat CBC, comprehensive metabolic pane and LDH. He was advised to call immediately  if she has any concerning symptoms in the interval. The patient voices understanding of current disease status and treatment options and is in agreement with the current care plan.  All questions were answered. The patient knows to call the clinic with any problems, questions or concerns. We can certainly see the patient much sooner if necessary.  Disclaimer: This note was dictated with voice recognition software. Similar sounding words can inadvertently be transcribed and may not be corrected upon review.

## 2015-12-16 ENCOUNTER — Telehealth: Payer: Self-pay | Admitting: Internal Medicine

## 2015-12-16 NOTE — Telephone Encounter (Signed)
Let message for patient in regards to the appointment change on 3/8 to 3/14 due to the provider will be out of the office. Schedule sent via mail.

## 2016-01-24 ENCOUNTER — Other Ambulatory Visit: Payer: Self-pay | Admitting: Internal Medicine

## 2016-01-27 ENCOUNTER — Ambulatory Visit: Payer: Medicare Other | Admitting: Internal Medicine

## 2016-01-27 ENCOUNTER — Other Ambulatory Visit: Payer: Medicare Other

## 2016-02-02 ENCOUNTER — Other Ambulatory Visit (HOSPITAL_BASED_OUTPATIENT_CLINIC_OR_DEPARTMENT_OTHER): Payer: Medicare Other

## 2016-02-02 ENCOUNTER — Encounter: Payer: Self-pay | Admitting: Internal Medicine

## 2016-02-02 ENCOUNTER — Ambulatory Visit (HOSPITAL_BASED_OUTPATIENT_CLINIC_OR_DEPARTMENT_OTHER): Payer: Medicare Other | Admitting: Internal Medicine

## 2016-02-02 ENCOUNTER — Telehealth: Payer: Self-pay | Admitting: Internal Medicine

## 2016-02-02 VITALS — BP 143/65 | HR 54 | Temp 98.3°F | Resp 17 | Ht 68.0 in | Wt 176.0 lb

## 2016-02-02 DIAGNOSIS — D72829 Elevated white blood cell count, unspecified: Secondary | ICD-10-CM

## 2016-02-02 DIAGNOSIS — D471 Chronic myeloproliferative disease: Secondary | ICD-10-CM | POA: Diagnosis not present

## 2016-02-02 LAB — COMPREHENSIVE METABOLIC PANEL
ALBUMIN: 2.8 g/dL — AB (ref 3.5–5.0)
ALK PHOS: 91 U/L (ref 40–150)
ALT: 10 U/L (ref 0–55)
AST: 22 U/L (ref 5–34)
Anion Gap: 7 mEq/L (ref 3–11)
BILIRUBIN TOTAL: 0.36 mg/dL (ref 0.20–1.20)
BUN: 19.6 mg/dL (ref 7.0–26.0)
CALCIUM: 10 mg/dL (ref 8.4–10.4)
CO2: 26 mEq/L (ref 22–29)
CREATININE: 1.2 mg/dL (ref 0.7–1.3)
Chloride: 105 mEq/L (ref 98–109)
EGFR: 54 mL/min/{1.73_m2} — ABNORMAL LOW (ref >90)
Glucose: 101 mg/dl (ref 70–140)
Potassium: 4 mEq/L (ref 3.5–5.1)
Sodium: 137 mEq/L (ref 136–145)
Total Protein: 9.4 g/dL — ABNORMAL HIGH (ref 6.4–8.3)

## 2016-02-02 LAB — LACTATE DEHYDROGENASE: LDH: 207 U/L (ref 125–245)

## 2016-02-02 LAB — CBC WITH DIFFERENTIAL/PLATELET
BASO%: 0.3 % (ref 0.0–2.0)
BASOS ABS: 0.1 10*3/uL (ref 0.0–0.1)
EOS%: 0.3 % (ref 0.0–7.0)
Eosinophils Absolute: 0.1 10*3/uL (ref 0.0–0.5)
HEMATOCRIT: 34.4 % — AB (ref 38.4–49.9)
HEMOGLOBIN: 11.5 g/dL — AB (ref 13.0–17.1)
LYMPH#: 2.8 10*3/uL (ref 0.9–3.3)
LYMPH%: 11.5 % — ABNORMAL LOW (ref 14.0–49.0)
MCH: 35.4 pg — ABNORMAL HIGH (ref 27.2–33.4)
MCHC: 33.3 g/dL (ref 32.0–36.0)
MCV: 106.3 fL — ABNORMAL HIGH (ref 79.3–98.0)
MONO#: 1.6 10*3/uL — AB (ref 0.1–0.9)
MONO%: 6.7 % (ref 0.0–14.0)
NEUT#: 19.5 10*3/uL — ABNORMAL HIGH (ref 1.5–6.5)
NEUT%: 81.2 % — ABNORMAL HIGH (ref 39.0–75.0)
Platelets: 516 10*3/uL — ABNORMAL HIGH (ref 140–400)
RBC: 3.24 10*6/uL — ABNORMAL LOW (ref 4.20–5.82)
RDW: 14.3 % (ref 11.0–14.6)
WBC: 24 10*3/uL — ABNORMAL HIGH (ref 4.0–10.3)

## 2016-02-02 NOTE — Progress Notes (Signed)
[facility] Telephone:([phone]   Fax:(336) [place], [place] [address]  DIAGNOSIS: Myeloproliferative disorder with positive JAK-2 mutation.  PRIOR THERAPY: None.  CURRENT THERAPY: Hydrea 500 mg by mouth daily. Status post approximately 14 months of treatment.   INTERVAL HISTORY: Adam Tapia 82 y.o. male returns to the clinic today for three-month followup visit accompanied by his wife. The patient is feeling fine today with no significant complaints except for mild fatigue. He is currently on treatment with hydroxyurea 500 mg by mouth daily and tolerating it fairly well with no significant adverse effects. He continues to have arthritis and currently being evaluated for left hip replacement. He denied having any significant chest pain, shortness of breath, cough or hemoptysis. He has no significant weight loss or night sweats. He has no nausea or vomiting. He has repeat CBC performed earlier today and he is here for evaluation and discussion of his lab results.  ALLERGIES:  has No Known Allergies.  MEDICATIONS:  Current Outpatient Prescriptions  Medication Sig Dispense Refill  . acetaminophen (TYLENOL) 325 MG tablet Take 650 mg by mouth every 6 (six) hours as needed.    Marland Kitchen amLODipine (NORVASC) 5 MG tablet Take 5 mg by mouth 2 (two) times daily.     Marland Kitchen atenolol (TENORMIN) 25 MG tablet Take 25 mg by mouth daily.  5  . digoxin (LANOXIN) 0.25 MG tablet Take 0.25 mg by mouth daily.    . fexofenadine (ALLEGRA) 180 MG tablet Take 180 mg by mouth daily.    . fluticasone (FLONASE) 50 MCG/ACT nasal spray     . hydroxyurea (HYDREA) 500 MG capsule TAKE 1 CAPSULE (500 MG TOTAL) BY MOUTH DAILY. MAY TAKE WITH FOOD TO MINIMIZE GI SIDE EFFECTS. 90 capsule 1  . ketotifen (ZADITOR) 0.025 % ophthalmic solution 1 drop 2 (two) times daily.    Marland Kitchen losartan (COZAAR) 50 MG tablet Take 50 mg by mouth every morning.   1  . OVER THE COUNTER MEDICATION daily.    Marland Kitchen triamterene-hydrochlorothiazide (MAXZIDE-25) 37.5-25 MG per tablet Take 1 tablet by mouth daily. Pt states he takes 1/2 tablet daily     No current facility-administered medications for this visit.    REVIEW OF SYSTEMS:  A comprehensive review of systems was negative except for: Musculoskeletal: positive for arthralgias   PHYSICAL EXAMINATION: General appearance: alert, cooperative, fatigued and no distress Head: Normocephalic, without obvious abnormality, atraumatic Neck: no adenopathy Lymph nodes: Cervical, supraclavicular, and axillary nodes normal. Resp: clear to auscultation bilaterally Cardio: regular rate and rhythm, S1, S2 normal, no murmur, click, rub or gallop GI: soft, non-tender; bowel sounds normal; no masses,  no organomegaly Extremities: extremities normal, atraumatic, no cyanosis or edema  ECOG PERFORMANCE STATUS: 1 - Symptomatic but completely ambulatory  There were no vitals taken for this visit.  LABORATORY DATA: Lab Results  Component Value Date   WBC 24.0* 02/02/2016   HGB 11.5* 02/02/2016   HCT 34.4* 02/02/2016   MCV 106.3* 02/02/2016   PLT 516* 02/02/2016      Chemistry      Component Value Date/Time   NA 139 10/29/2015 0830   K 4.1 10/29/2015 0830   CO2 24 10/29/2015 0830   BUN 22.7 10/29/2015 0830   CREATININE 1.1 10/29/2015 0830      Component Value Date/Time   CALCIUM 10.3 10/29/2015 0830   ALKPHOS 106 10/29/2015 0830   AST 16 10/29/2015  0830   ALT 10 10/29/2015 0830   BILITOT 0.39 10/29/2015 0830       RADIOGRAPHIC STUDIES: No results found.  ASSESSMENT AND PLAN: Very pleasant 82 years old [place] American male with persistent leukocytosis secondary to myeloproliferative disorder with positive JAK-2 mutation. He is currently undergoing treatment with hydroxyurea 500 mg by mouth daily and tolerating his treatment fairly well. His CBC showed persistent leukocytosis and platelets count Is  mildly elevated compared to the lab 3 months ago. There was also drop in his hemoglobin and hematocrit compared to the previous bloodwork 3 months ago. The patient denied having any bleeding issues. I recommended for him to continue on the same treatment for now. I would see him back for followup visit in 3 months with repeat CBC, comprehensive metabolic pane and LDH. From the oncology standpoint, the patient can proceed with his left hip replacement but he will need clearance from his primary care physician regarding his cardiac and pulmonary function. Okay for the patient to hold his treatment with hydroxyurea for the week before and week after his surgery. He was advised to call immediately if she has any concerning symptoms in the interval. The patient voices understanding of current disease status and treatment options and is in agreement with the current care plan.  All questions were answered. The patient knows to call the clinic with any problems, questions or concerns. We can certainly see the patient much sooner if necessary.  Disclaimer: This note was dictated with voice recognition software. Similar sounding words can inadvertently be transcribed and may not be corrected upon review.

## 2016-02-02 NOTE — Telephone Encounter (Signed)
Gave and printed appt sched and avs for pt for June °

## 2016-02-26 ENCOUNTER — Other Ambulatory Visit: Payer: Self-pay | Admitting: Physician Assistant

## 2016-02-29 NOTE — Patient Instructions (Addendum)
YOUR PROCEDURE IS SCHEDULED ON :  03/11/16  REPORT TO [facility] MAIN ENTRANCE FOLLOW SIGNS TO EAST ELEVATOR - GO TO 3rd FLOOR CHECK IN AT 3 EAST NURSES STATION (SHORT STAY) AT:  10:15 AM  CALL THIS NUMBER IF YOU HAVE PROBLEMS THE MORNING OF SURGERY [phone]  REMEMBER:ONLY 1 PER PERSON MAY GO TO SHORT STAY WITH YOU TO GET READY THE MORNING OF YOUR SURGERY  DO NOT EAT FOOD OR DRINK LIQUIDS AFTER MIDNIGHT  TAKE THESE MEDICINES THE MORNING OF SURGERY: AMLODIPINE / ATENOLOL / DIGOXIN / ALLEGRA / TYLENOL  YOU MAY NOT HAVE ANY METAL ON YOUR BODY INCLUDING HAIR PINS AND PIERCING'S. DO NOT WEAR JEWELRY, MAKEUP, LOTIONS, POWDERS OR PERFUMES. DO NOT WEAR NAIL POLISH. DO NOT SHAVE 48 HRS PRIOR TO SURGERY. MEN MAY SHAVE FACE AND NECK.  DO NOT [place]. [facility] IS NOT RESPONSIBLE FOR VALUABLES.  CONTACTS, DENTURES OR PARTIALS MAY NOT BE WORN TO SURGERY. LEAVE SUITCASE IN CAR. CAN BE BROUGHT TO ROOM AFTER SURGERY.  PATIENTS DISCHARGED THE DAY OF SURGERY WILL NOT BE ALLOWED TO DRIVE HOME.  PLEASE READ OVER THE FOLLOWING INSTRUCTION SHEETS _________________________________________________________________________________                                          [place] - PREPARING FOR SURGERY  Before surgery, you can play an important role.  Because skin is not sterile, your skin needs to be as free of germs as possible.  You can reduce the number of germs on your skin by washing with CHG (chlorahexidine gluconate) soap before surgery.  CHG is an antiseptic cleaner which kills germs and bonds with the skin to continue killing germs even after washing. Please DO NOT use if you have an allergy to CHG or antibacterial soaps.  If your skin becomes reddened/irritated stop using the CHG and inform your nurse when you arrive at Short Stay. Do not shave (including legs and underarms) for at least 48 hours prior to the first CHG shower.  You may shave your  face. Please follow these instructions carefully:   1.  Shower with CHG Soap the night before surgery and the  morning of Surgery.   2.  If you choose to wash your hair, wash your hair first as usual with your  normal  Shampoo.   3.  After you shampoo, rinse your hair and body thoroughly to remove the  shampoo.                                         4.  Use CHG as you would any other liquid soap.  You can apply chg directly  to the skin and wash . Gently wash with scrungie or clean wascloth    5.  Apply the CHG Soap to your body ONLY FROM THE NECK DOWN.   Do not use on open                           Wound or open sores. Avoid contact with eyes, ears mouth and genitals (private parts).                        Genitals (  private parts) with your normal soap.              6.  Wash thoroughly, paying special attention to the area where your surgery  will be performed.   7.  Thoroughly rinse your body with warm water from the neck down.   8.  DO NOT shower/wash with your normal soap after using and rinsing off  the CHG Soap .                9.  Pat yourself dry with a clean towel.             10.  Wear clean night clothes to bed after shower             11.  Place clean sheets on your bed the night of your first shower and do not  sleep with pets.  Day of Surgery : Do not apply any lotions/deodorants the morning of surgery.  Please wear clean clothes to the hospital/surgery center.  FAILURE TO FOLLOW THESE INSTRUCTIONS MAY RESULT IN THE CANCELLATION OF YOUR SURGERY    PATIENT SIGNATURE_________________________________  ______________________________________________________________________     Adam Tapia  An incentive spirometer is a tool that can help keep your lungs clear and active. This tool measures how well you are filling your lungs with each breath. Taking long deep breaths may help reverse or decrease the chance of developing breathing (pulmonary) problems  (especially infection) following:  A long period of time when you are unable to move or be active. BEFORE THE PROCEDURE   If the spirometer includes an indicator to show your best effort, your nurse or respiratory therapist will set it to a desired goal.  If possible, sit up straight or lean slightly forward. Try not to slouch.  Hold the incentive spirometer in an upright position. INSTRUCTIONS FOR USE  1. Sit on the edge of your bed if possible, or sit up as far as you can in bed or on a chair. 2. Hold the incentive spirometer in an upright position. 3. Breathe out normally. 4. Place the mouthpiece in your mouth and seal your lips tightly around it. 5. Breathe in slowly and as deeply as possible, raising the piston or the ball toward the top of the column. 6. Hold your breath for 3-5 seconds or for as long as possible. Allow the piston or ball to fall to the bottom of the column. 7. Remove the mouthpiece from your mouth and breathe out normally. 8. Rest for a few seconds and repeat Steps 1 through 7 at least 10 times every 1-2 hours when you are awake. Take your time and take a few normal breaths between deep breaths. 9. The spirometer may include an indicator to show your best effort. Use the indicator as a goal to work toward during each repetition. 10. After each set of 10 deep breaths, practice coughing to be sure your lungs are clear. If you have an incision (the cut made at the time of surgery), support your incision when coughing by placing a pillow or rolled up towels firmly against it. Once you are able to get out of bed, walk around indoors and cough well. You may stop using the incentive spirometer when instructed by your caregiver.  RISKS AND COMPLICATIONS  Take your time so you do not get dizzy or light-headed.  If you are in pain, you may need to take or ask for pain medication before doing incentive spirometry. It is harder  to take a deep breath if you are having  pain. AFTER USE  Rest and breathe slowly and easily.  It can be helpful to keep track of a log of your progress. Your caregiver can provide you with a simple table to help with this. If you are using the spirometer at home, follow these instructions: [place] IF:   You are having difficultly using the spirometer.  You have trouble using the spirometer as often as instructed.  Your pain medication is not giving enough relief while using the spirometer.  You develop fever of 100.5 F (38.1 C) or higher. SEEK IMMEDIATE MEDICAL CARE IF:   You cough up bloody sputum that had not been present before.  You develop fever of 102 F (38.9 C) or greater.  You develop worsening pain at or near the incision site. MAKE SURE YOU:   Understand these instructions.  Will watch your condition.  Will get help right away if you are not doing well or get worse. Document Released: 03/20/2007 Document Revised: 01/30/2012 Document Reviewed: 05/21/2007 [facility] Patient Information 2014 [place], [place].   ________________________________________________________________________

## 2016-03-01 ENCOUNTER — Encounter (HOSPITAL_COMMUNITY)
Admission: RE | Admit: 2016-03-01 | Discharge: 2016-03-01 | Disposition: A | Discharge status: Home / Self Care | Payer: Medicare Other | Source: Ambulatory Visit | Attending: Orthopaedic Surgery | Admitting: Orthopaedic Surgery

## 2016-03-01 ENCOUNTER — Encounter (HOSPITAL_COMMUNITY): Payer: Self-pay

## 2016-03-01 DIAGNOSIS — Z01812 Encounter for preprocedural laboratory examination: Secondary | ICD-10-CM | POA: Diagnosis not present

## 2016-03-01 DIAGNOSIS — Z01810 Encounter for preprocedural cardiovascular examination: Secondary | ICD-10-CM | POA: Diagnosis present

## 2016-03-01 DIAGNOSIS — M1612 Unilateral primary osteoarthritis, left hip: Secondary | ICD-10-CM | POA: Insufficient documentation

## 2016-03-01 DIAGNOSIS — I10 Essential (primary) hypertension: Secondary | ICD-10-CM | POA: Insufficient documentation

## 2016-03-01 HISTORY — DX: Nocturia: R35.1

## 2016-03-01 HISTORY — DX: Gastro-esophageal reflux disease without esophagitis: K21.9

## 2016-03-01 HISTORY — DX: Essential (primary) hypertension: I10

## 2016-03-01 HISTORY — DX: Psoriasis, unspecified: L40.9

## 2016-03-01 HISTORY — DX: Unspecified osteoarthritis, unspecified site: M19.90

## 2016-03-01 HISTORY — DX: Benign prostatic hyperplasia without lower urinary tract symptoms: N40.0

## 2016-03-01 HISTORY — DX: Anemia, unspecified: D64.9

## 2016-03-01 HISTORY — DX: Cardiac arrhythmia, unspecified: I49.9

## 2016-03-01 HISTORY — DX: Chronic myeloproliferative disease: D47.1

## 2016-03-01 LAB — CBC
HEMATOCRIT: 33.7 % — AB (ref 39.0–52.0)
HEMOGLOBIN: 11.6 g/dL — AB (ref 13.0–17.0)
MCH: 35.6 pg — AB (ref 26.0–34.0)
MCHC: 34.4 g/dL (ref 30.0–36.0)
MCV: 103.4 fL — ABNORMAL HIGH (ref 78.0–100.0)
Platelets: 512 10*3/uL — ABNORMAL HIGH (ref 150–400)
RBC: 3.26 MIL/uL — ABNORMAL LOW (ref 4.22–5.81)
RDW: 14.3 % (ref 11.5–15.5)
WBC: 22.1 10*3/uL — ABNORMAL HIGH (ref 4.0–10.5)

## 2016-03-01 LAB — BASIC METABOLIC PANEL
Anion gap: 6 (ref 5–15)
BUN: 18 mg/dL (ref 6–20)
CALCIUM: 9.9 mg/dL (ref 8.9–10.3)
CHLORIDE: 108 mmol/L (ref 101–111)
CO2: 26 mmol/L (ref 22–32)
CREATININE: 1.15 mg/dL (ref 0.61–1.24)
GFR, EST NON AFRICAN AMERICAN: 54 mL/min — AB (ref >60)
Glucose, Bld: 87 mg/dL (ref 65–99)
Potassium: 4.3 mmol/L (ref 3.5–5.1)
Sodium: 140 mmol/L (ref 135–145)

## 2016-03-01 LAB — PROTIME-INR
INR: 1.11 (ref 0.00–1.49)
PROTHROMBIN TIME: 14.5 s (ref 11.6–15.2)

## 2016-03-01 LAB — APTT: aPTT: 36 seconds (ref 24–37)

## 2016-03-01 LAB — SURGICAL PCR SCREEN
MRSA, PCR: NEGATIVE
STAPHYLOCOCCUS AUREUS: NEGATIVE

## 2016-03-02 LAB — TYPE AND SCREEN
ABO/RH(D): O POS
Antibody Screen: POSITIVE
DAT, IgG: NEGATIVE

## 2016-03-09 NOTE — Progress Notes (Signed)
PT notified of time change to 11:50 am - inst to arrive at 9:45 am

## 2016-03-11 ENCOUNTER — Encounter (HOSPITAL_COMMUNITY): Payer: Self-pay | Admitting: *Deleted

## 2016-03-11 ENCOUNTER — Inpatient Hospital Stay (HOSPITAL_COMMUNITY): Payer: Medicare Other

## 2016-03-11 ENCOUNTER — Inpatient Hospital Stay (HOSPITAL_COMMUNITY): Payer: Medicare Other | Admitting: Anesthesiology

## 2016-03-11 ENCOUNTER — Encounter (HOSPITAL_COMMUNITY)
Admission: RE | Disposition: A | Discharge status: Skilled Nursing Facility | Payer: Self-pay | Source: Ambulatory Visit | Attending: Orthopaedic Surgery

## 2016-03-11 ENCOUNTER — Inpatient Hospital Stay (HOSPITAL_COMMUNITY)
Admission: RE | Admit: 2016-03-11 | Discharge: 2016-03-14 | DRG: 470 | Disposition: A | Discharge status: Skilled Nursing Facility | Payer: Medicare Other | Source: Ambulatory Visit | Attending: Orthopaedic Surgery | Admitting: Orthopaedic Surgery

## 2016-03-11 DIAGNOSIS — I9589 Other hypotension: Secondary | ICD-10-CM | POA: Diagnosis not present

## 2016-03-11 DIAGNOSIS — Z79899 Other long term (current) drug therapy: Secondary | ICD-10-CM | POA: Diagnosis not present

## 2016-03-11 DIAGNOSIS — N400 Enlarged prostate without lower urinary tract symptoms: Secondary | ICD-10-CM | POA: Diagnosis present

## 2016-03-11 DIAGNOSIS — Z01812 Encounter for preprocedural laboratory examination: Secondary | ICD-10-CM

## 2016-03-11 DIAGNOSIS — I10 Essential (primary) hypertension: Secondary | ICD-10-CM | POA: Diagnosis present

## 2016-03-11 DIAGNOSIS — Z419 Encounter for procedure for purposes other than remedying health state, unspecified: Secondary | ICD-10-CM

## 2016-03-11 DIAGNOSIS — Z96642 Presence of left artificial hip joint: Secondary | ICD-10-CM

## 2016-03-11 DIAGNOSIS — M1612 Unilateral primary osteoarthritis, left hip: Principal | ICD-10-CM

## 2016-03-11 DIAGNOSIS — I4891 Unspecified atrial fibrillation: Secondary | ICD-10-CM | POA: Diagnosis present

## 2016-03-11 DIAGNOSIS — C946 Myelodysplastic disease, not classified: Secondary | ICD-10-CM | POA: Diagnosis not present

## 2016-03-11 DIAGNOSIS — M25552 Pain in left hip: Secondary | ICD-10-CM | POA: Diagnosis present

## 2016-03-11 DIAGNOSIS — R300 Dysuria: Secondary | ICD-10-CM | POA: Diagnosis present

## 2016-03-11 DIAGNOSIS — Z96641 Presence of right artificial hip joint: Secondary | ICD-10-CM

## 2016-03-11 HISTORY — PX: TOTAL HIP ARTHROPLASTY: SHX124

## 2016-03-11 LAB — PREPARE RBC (CROSSMATCH)

## 2016-03-11 SURGERY — ARTHROPLASTY, HIP, TOTAL, ANTERIOR APPROACH
Anesthesia: Spinal | Site: Hip | Laterality: Left

## 2016-03-11 MED ORDER — HYDROMORPHONE HCL 1 MG/ML IJ SOLN
0.5000 mg | INTRAMUSCULAR | Status: DC | PRN
  Administered 2016-03-11: 0.5 mg via INTRAVENOUS
  Filled 2016-03-11: qty 1

## 2016-03-11 MED ORDER — ONDANSETRON HCL 4 MG/2ML IJ SOLN
INTRAMUSCULAR | Status: AC
  Filled 2016-03-11: qty 4

## 2016-03-11 MED ORDER — ONDANSETRON HCL 4 MG PO TABS: 4.0000 mg | ORAL_TABLET | Freq: Four times a day (QID) | ORAL | Status: DC | PRN

## 2016-03-11 MED ORDER — ALUM & MAG HYDROXIDE-SIMETH 200-200-20 MG/5ML PO SUSP
30.0000 mL | ORAL | Status: DC | PRN
  Administered 2016-03-14: 30 mL via ORAL
  Filled 2016-03-11 (×2): qty 30

## 2016-03-11 MED ORDER — PHENYLEPHRINE HCL 10 MG/ML IJ SOLN
10.0000 mg | INTRAVENOUS | Status: DC | PRN
  Administered 2016-03-11: 50 ug/min via INTRAVENOUS

## 2016-03-11 MED ORDER — SODIUM CHLORIDE 0.9 % IV SOLN
1000.0000 mg | INTRAVENOUS | Status: AC
  Administered 2016-03-11: 1000 mg via INTRAVENOUS
  Filled 2016-03-11: qty 10

## 2016-03-11 MED ORDER — DEXAMETHASONE SODIUM PHOSPHATE 10 MG/ML IJ SOLN
INTRAMUSCULAR | Status: DC | PRN
Start: 2016-03-11 — End: 2016-03-11
  Administered 2016-03-11: 5 mg via INTRAVENOUS

## 2016-03-11 MED ORDER — EPHEDRINE SULFATE 50 MG/ML IJ SOLN
INTRAMUSCULAR | Status: DC | PRN
  Administered 2016-03-11: 10 mg via INTRAVENOUS
  Administered 2016-03-11: 15 mg via INTRAVENOUS
  Administered 2016-03-11: 10 mg via INTRAVENOUS
  Administered 2016-03-11: 15 mg via INTRAVENOUS

## 2016-03-11 MED ORDER — MENTHOL 3 MG MT LOZG: 1.0000 | LOZENGE | OROMUCOSAL | Status: DC | PRN

## 2016-03-11 MED ORDER — ALBUMIN HUMAN 5 % IV SOLN
INTRAVENOUS | Status: DC | PRN
  Administered 2016-03-11: 13:00:00 via INTRAVENOUS

## 2016-03-11 MED ORDER — METOCLOPRAMIDE HCL 10 MG PO TABS: 5.0000 mg | ORAL_TABLET | Freq: Three times a day (TID) | ORAL | Status: DC | PRN

## 2016-03-11 MED ORDER — LACTATED RINGERS IV SOLN
INTRAVENOUS | Status: DC | PRN
  Administered 2016-03-11: 13:00:00 via INTRAVENOUS

## 2016-03-11 MED ORDER — ONDANSETRON HCL 4 MG/2ML IJ SOLN: 4.0000 mg | Freq: Four times a day (QID) | INTRAMUSCULAR | Status: DC | PRN

## 2016-03-11 MED ORDER — CEFAZOLIN SODIUM 1-5 GM-% IV SOLN
1.0000 g | Freq: Four times a day (QID) | INTRAVENOUS | Status: AC
  Administered 2016-03-11 (×2): 1 g via INTRAVENOUS
  Filled 2016-03-11 (×2): qty 50

## 2016-03-11 MED ORDER — METHOCARBAMOL 500 MG PO TABS: 500.0000 mg | ORAL_TABLET | Freq: Four times a day (QID) | ORAL | Status: DC | PRN

## 2016-03-11 MED ORDER — HYDROMORPHONE HCL 1 MG/ML IJ SOLN
INTRAMUSCULAR | Status: AC
  Filled 2016-03-11: qty 1

## 2016-03-11 MED ORDER — ATENOLOL 25 MG PO TABS
25.0000 mg | ORAL_TABLET | Freq: Every day | ORAL | Status: DC
  Administered 2016-03-12 – 2016-03-14 (×3): 25 mg via ORAL
  Filled 2016-03-11 (×3): qty 1

## 2016-03-11 MED ORDER — DIPHENHYDRAMINE HCL 12.5 MG/5ML PO ELIX
12.5000 mg | ORAL_SOLUTION | ORAL | Status: DC | PRN
Start: 2016-03-11 — End: 2016-03-14
  Filled 2016-03-11: qty 10

## 2016-03-11 MED ORDER — LIDOCAINE HCL (CARDIAC) 20 MG/ML IV SOLN
INTRAVENOUS | Status: DC | PRN
  Administered 2016-03-11: 40 mg via INTRAVENOUS
  Administered 2016-03-11: 20 mg via INTRAVENOUS

## 2016-03-11 MED ORDER — HYDROXYUREA 500 MG PO CAPS
500.0000 mg | ORAL_CAPSULE | Freq: Every day | ORAL | Status: DC
  Administered 2016-03-12 – 2016-03-14 (×3): 500 mg via ORAL
  Filled 2016-03-11 (×6): qty 1

## 2016-03-11 MED ORDER — LACTATED RINGERS IV SOLN
INTRAVENOUS | Status: DC | PRN
Start: 2016-03-11 — End: 2016-03-11
  Administered 2016-03-11 (×3): via INTRAVENOUS

## 2016-03-11 MED ORDER — CHLORHEXIDINE GLUCONATE 4 % EX LIQD: 60.0000 mL | Freq: Once | CUTANEOUS | Status: DC

## 2016-03-11 MED ORDER — SODIUM CHLORIDE 0.9 % IV SOLN
INTRAVENOUS | Status: DC
  Administered 2016-03-11: 17:00:00 via INTRAVENOUS

## 2016-03-11 MED ORDER — ONDANSETRON HCL 4 MG/2ML IJ SOLN
INTRAMUSCULAR | Status: DC | PRN
  Administered 2016-03-11: 4 mg via INTRAVENOUS

## 2016-03-11 MED ORDER — FENTANYL CITRATE (PF) 100 MCG/2ML IJ SOLN
INTRAMUSCULAR | Status: AC
  Filled 2016-03-11: qty 2

## 2016-03-11 MED ORDER — KETOTIFEN FUMARATE 0.025 % OP SOLN
2.0000 [drp] | Freq: Two times a day (BID) | OPHTHALMIC | Status: DC
  Administered 2016-03-11 – 2016-03-14 (×4): 2 [drp] via OPHTHALMIC
  Filled 2016-03-11 (×3): qty 5

## 2016-03-11 MED ORDER — CEFAZOLIN SODIUM-DEXTROSE 2-4 GM/100ML-% IV SOLN
2.0000 g | INTRAVENOUS | Status: AC
  Administered 2016-03-11: 2 g via INTRAVENOUS
  Filled 2016-03-11: qty 100

## 2016-03-11 MED ORDER — DIGOXIN 250 MCG PO TABS
0.2500 mg | ORAL_TABLET | Freq: Every day | ORAL | Status: DC
  Administered 2016-03-12 – 2016-03-14 (×3): 0.25 mg via ORAL
  Filled 2016-03-11 (×3): qty 1

## 2016-03-11 MED ORDER — PROPOFOL 500 MG/50ML IV EMUL
INTRAVENOUS | Status: DC | PRN
  Administered 2016-03-11: 50 ug/kg/min via INTRAVENOUS

## 2016-03-11 MED ORDER — HYDROMORPHONE HCL 1 MG/ML IJ SOLN
0.2500 mg | INTRAMUSCULAR | Status: DC | PRN
  Administered 2016-03-11 (×2): 0.5 mg via INTRAVENOUS

## 2016-03-11 MED ORDER — OXYCODONE HCL 5 MG PO TABS
5.0000 mg | ORAL_TABLET | ORAL | Status: DC | PRN
  Administered 2016-03-11: 10 mg via ORAL
  Filled 2016-03-11: qty 2

## 2016-03-11 MED ORDER — PROPOFOL 10 MG/ML IV BOLUS
INTRAVENOUS | Status: DC | PRN
  Administered 2016-03-11: 40 mg via INTRAVENOUS
  Administered 2016-03-11 (×2): 20 mg via INTRAVENOUS

## 2016-03-11 MED ORDER — LOSARTAN POTASSIUM 50 MG PO TABS
50.0000 mg | ORAL_TABLET | Freq: Every morning | ORAL | Status: DC
  Administered 2016-03-12 – 2016-03-14 (×3): 50 mg via ORAL
  Filled 2016-03-11 (×3): qty 1

## 2016-03-11 MED ORDER — LIDOCAINE HCL (CARDIAC) 20 MG/ML IV SOLN
INTRAVENOUS | Status: AC
  Filled 2016-03-11: qty 5

## 2016-03-11 MED ORDER — ALBUMIN HUMAN 5 % IV SOLN
INTRAVENOUS | Status: AC
  Filled 2016-03-11: qty 250

## 2016-03-11 MED ORDER — SODIUM CHLORIDE 0.9 % IV SOLN: Freq: Once | INTRAVENOUS | Status: DC

## 2016-03-11 MED ORDER — ASPIRIN EC 325 MG PO TBEC
325.0000 mg | DELAYED_RELEASE_TABLET | Freq: Every day | ORAL | Status: DC
  Administered 2016-03-12 – 2016-03-14 (×3): 325 mg via ORAL
  Filled 2016-03-11 (×4): qty 1

## 2016-03-11 MED ORDER — PROPOFOL 10 MG/ML IV BOLUS
INTRAVENOUS | Status: AC
  Filled 2016-03-11: qty 40

## 2016-03-11 MED ORDER — METHOCARBAMOL 1000 MG/10ML IJ SOLN
500.0000 mg | Freq: Four times a day (QID) | INTRAVENOUS | Status: DC | PRN
  Administered 2016-03-11: 500 mg via INTRAVENOUS
  Filled 2016-03-11 (×2): qty 5

## 2016-03-11 MED ORDER — METOCLOPRAMIDE HCL 5 MG/ML IJ SOLN
5.0000 mg | Freq: Three times a day (TID) | INTRAMUSCULAR | Status: DC | PRN
Start: 2016-03-11 — End: 2016-03-14

## 2016-03-11 MED ORDER — DOCUSATE SODIUM 100 MG PO CAPS
100.0000 mg | ORAL_CAPSULE | Freq: Two times a day (BID) | ORAL | Status: DC
  Administered 2016-03-11 – 2016-03-14 (×6): 100 mg via ORAL

## 2016-03-11 MED ORDER — ACETAMINOPHEN 325 MG PO TABS
650.0000 mg | ORAL_TABLET | Freq: Four times a day (QID) | ORAL | Status: DC | PRN
Start: 2016-03-11 — End: 2016-03-14

## 2016-03-11 MED ORDER — BUPIVACAINE IN DEXTROSE 0.75-8.25 % IT SOLN
INTRATHECAL | Status: DC | PRN
  Administered 2016-03-11: 2 mL via INTRATHECAL

## 2016-03-11 MED ORDER — ACETAMINOPHEN 650 MG RE SUPP: 650.0000 mg | Freq: Four times a day (QID) | RECTAL | Status: DC | PRN

## 2016-03-11 MED ORDER — 0.9 % SODIUM CHLORIDE (POUR BTL) OPTIME
TOPICAL | Status: DC | PRN
  Administered 2016-03-11: 1000 mL

## 2016-03-11 MED ORDER — SODIUM CHLORIDE 0.9 % IR SOLN
Status: DC | PRN
  Administered 2016-03-11: 1000 mL

## 2016-03-11 MED ORDER — TURMERIC 500 MG PO CAPS: ORAL_CAPSULE | Freq: Every day | ORAL | Status: DC

## 2016-03-11 MED ORDER — FENTANYL CITRATE (PF) 100 MCG/2ML IJ SOLN
INTRAMUSCULAR | Status: DC | PRN
  Administered 2016-03-11: 25 ug via INTRAVENOUS
  Administered 2016-03-11 (×2): 50 ug via INTRAVENOUS

## 2016-03-11 MED ORDER — PHENYLEPHRINE HCL 10 MG/ML IJ SOLN
INTRAMUSCULAR | Status: DC | PRN
  Administered 2016-03-11 (×2): 120 ug via INTRAVENOUS
  Administered 2016-03-11: 160 ug via INTRAVENOUS
  Administered 2016-03-11 (×3): 120 ug via INTRAVENOUS

## 2016-03-11 MED ORDER — FLUTICASONE PROPIONATE 50 MCG/ACT NA SUSP
1.0000 | Freq: Every day | NASAL | Status: DC | PRN
  Filled 2016-03-11: qty 16

## 2016-03-11 MED ORDER — PHENOL 1.4 % MT LIQD
1.0000 | OROMUCOSAL | Status: DC | PRN
  Filled 2016-03-11: qty 177

## 2016-03-11 MED ORDER — CEFAZOLIN SODIUM-DEXTROSE 2-4 GM/100ML-% IV SOLN
INTRAVENOUS | Status: AC
  Filled 2016-03-11: qty 100

## 2016-03-11 MED ORDER — DEXAMETHASONE SODIUM PHOSPHATE 10 MG/ML IJ SOLN
INTRAMUSCULAR | Status: AC
  Filled 2016-03-11: qty 1

## 2016-03-11 MED ORDER — STERILE WATER FOR IRRIGATION IR SOLN
Status: DC | PRN
  Administered 2016-03-11: 2000 mL

## 2016-03-11 MED ORDER — TRIAMTERENE-HCTZ 37.5-25 MG PO TABS
1.0000 | ORAL_TABLET | Freq: Every day | ORAL | Status: DC
  Administered 2016-03-11 – 2016-03-14 (×4): 1 via ORAL
  Filled 2016-03-11 (×4): qty 1

## 2016-03-11 SURGICAL SUPPLY — 44 items
APL SKNCLS STERI-STRIP NONHPOA (GAUZE/BANDAGES/DRESSINGS) ×1
BAG SPEC THK2 15X12 ZIP CLS (MISCELLANEOUS) ×1
BAG ZIPLOCK 12X15 (MISCELLANEOUS) ×2 IMPLANT
BENZOIN TINCTURE PRP APPL 2/3 (GAUZE/BANDAGES/DRESSINGS) ×2 IMPLANT
BLADE SAW SGTL 18X1.27X75 (BLADE) ×2 IMPLANT
BLADE SAW SGTL 18X1.27X75MM (BLADE) ×1
CAPT HIP TOTAL 2 ×2 IMPLANT
CELLS DAT CNTRL 66122 CELL SVR (MISCELLANEOUS) ×1 IMPLANT
CLOSURE WOUND 1/2 X4 (GAUZE/BANDAGES/DRESSINGS) ×1
CLOTH BEACON ORANGE TIMEOUT ST (SAFETY) ×3 IMPLANT
DRAPE STERI IOBAN 125X83 (DRAPES) ×3 IMPLANT
DRAPE U-SHAPE 47X51 STRL (DRAPES) ×6 IMPLANT
DRSG AQUACEL AG ADV 3.5X10 (GAUZE/BANDAGES/DRESSINGS) ×3 IMPLANT
DURAPREP 26ML APPLICATOR (WOUND CARE) ×3 IMPLANT
ELECT REM PT RETURN 9FT ADLT (ELECTROSURGICAL) ×3
ELECTRODE REM PT RTRN 9FT ADLT (ELECTROSURGICAL) ×1 IMPLANT
GLOVE BIO SURGEON STRL SZ7.5 (GLOVE) ×3 IMPLANT
GLOVE BIOGEL PI IND STRL 6.5 (GLOVE) IMPLANT
GLOVE BIOGEL PI IND STRL 7.0 (GLOVE) IMPLANT
GLOVE BIOGEL PI IND STRL 7.5 (GLOVE) IMPLANT
GLOVE BIOGEL PI IND STRL 8 (GLOVE) ×2 IMPLANT
GLOVE BIOGEL PI INDICATOR 6.5 (GLOVE) ×2
GLOVE BIOGEL PI INDICATOR 7.0 (GLOVE) ×2
GLOVE BIOGEL PI INDICATOR 7.5 (GLOVE) ×8
GLOVE BIOGEL PI INDICATOR 8 (GLOVE) ×2
GLOVE ECLIPSE 8.0 STRL XLNG CF (GLOVE) ×3 IMPLANT
GLOVE SURG SS PI 7.0 STRL IVOR (GLOVE) ×2 IMPLANT
GLOVE SURG SS PI 7.5 STRL IVOR (GLOVE) ×2 IMPLANT
GOWN STRL REUS W/TWL LRG LVL3 (GOWN DISPOSABLE) ×2 IMPLANT
GOWN STRL REUS W/TWL XL LVL3 (GOWN DISPOSABLE) ×10 IMPLANT
HANDPIECE INTERPULSE COAX TIP (DISPOSABLE) ×3
HOLDER FOLEY CATH W/STRAP (MISCELLANEOUS) ×3 IMPLANT
PACK ANTERIOR HIP CUSTOM (KITS) ×3 IMPLANT
RETRACTOR WND ALEXIS 18 MED (MISCELLANEOUS) ×1 IMPLANT
RTRCTR WOUND ALEXIS 18CM MED (MISCELLANEOUS) ×3
SET HNDPC FAN SPRY TIP SCT (DISPOSABLE) ×1 IMPLANT
STRIP CLOSURE SKIN 1/2X4 (GAUZE/BANDAGES/DRESSINGS) ×1 IMPLANT
SUT ETHIBOND NAB CT1 #1 30IN (SUTURE) ×5 IMPLANT
SUT MNCRL AB 4-0 PS2 18 (SUTURE) IMPLANT
SUT VIC AB 0 CT1 36 (SUTURE) ×3 IMPLANT
SUT VIC AB 1 CT1 36 (SUTURE) ×5 IMPLANT
SUT VIC AB 2-0 CT1 27 (SUTURE) ×6
SUT VIC AB 2-0 CT1 TAPERPNT 27 (SUTURE) ×2 IMPLANT
TRAY FOLEY W/METER SILVER 16FR (SET/KITS/TRAYS/PACK) ×3 IMPLANT

## 2016-03-11 NOTE — Progress Notes (Signed)
Neo gtt off per CRNA

## 2016-03-11 NOTE — Op Note (Signed)
[name], [name] NO.:  [id number]  MEDICAL RECORD NO.:  [id number]  LOCATION:  [facility]                         FACILITY:  [facility]  PHYSICIAN:  Lind Guest. Ninfa Linden, M.D.DATE OF BIRTH:  19-Jul-1923  DATE OF PROCEDURE:  03/11/2016 DATE OF DISCHARGE:                              OPERATIVE REPORT   PREOPERATIVE DIAGNOSIS:  Severe osteoarthritis and degenerative joint disease, left hip.  POSTOPERATIVE DIAGNOSIS:  Severe osteoarthritis and degenerative joint disease, left hip.  PROCEDURE:  Left total hip arthroplasty, direct anterior approach.  IMPLANTS:  DePuy Sector Gription acetabular component size 60, size 36 neutral polyethylene liner, size 13 Corail femoral component with standard offset, size 36+ 1.5 ceramic hip ball.  SURGEON:  Lind Guest. Ninfa Linden, MD  ANESTHESIA: 1. Spinal. 2. General.  ANTIBIOTICS:  2 g IV Ancef.  BLOOD LOSS:  1000 mL.  COMPLICATIONS:  Intraoperative hypotension.  INDICATIONS:  [name] is a 82 year old gentleman well known to me. Debilitating arthritis involving his left hip and has gotten to the point where his left leg is still considerably shorter.  His internal and external rotation are almost non-existent and his pain is daily. This has detrimentally affected his activities of daily living, his quality of life, and his mobility.  This has gotten so severe to him he does wish to proceed with a total hip arthroplasty.  He understands given his age, he has high risk of intraoperative and postoperative complications.  He has had some cardiac issues in the past.  He has been cleared for surgery.  He understands also the risk of acute blood loss anemia, nerve and vessel injury, fracture, infection, dislocation, DVT. He understands our goals are decreased pain, improved mobility, and overall improved quality of life.  PROCEDURE DESCRIPTION:  After informed consent was obtained, appropriate left hip was marked.  He was  brought to the operating room where spinal anesthesia was obtained while he was on a stretcher.  A Foley catheter was placed and then both feet had traction boots applied to them.  Next, he was placed supine on the Hana fracture table, the perineal post in place.  Both legs in inline skeletal traction devices, but no traction applied.  His left operative hip was prepped and draped with DuraPrep and sterile drapes.  Time-out was called.  He was identified as correct patient, correct left hip.  I then made incision inferior and posterior to the anterior superior iliac spine and carried this obliquely down the leg.  We dissected down the tensor fascia lata muscle and divided the tensor fascia longitudinally to begin our direct anterior approach to the hip.  We identified and cauterized circumflex vessels.  We identified the hip capsule and placed Cobra retractor on the medial and lateral femoral neck.  We then opened up the hip capsule in an L-type format finding a large joint effusion.  We placed Cobra retractors within the hip capsule.  I used an oscillating saw to make my femoral neck cut proximal to lesser trochanter and osteotome to finish this cut off.  We placed a corkscrew guide in the femoral head and removed the femoral head in its entirety.  It was a very large femoral  head and was completely devoid of cartilage.  There was significant intra-articular loose bodies.  There was significant periarticular osteophytes all around the acetabulum which we had to remove.  We placed a bent Hohmann over the medial acetabular rim, removes acetabular labrum.  We noted significant amount of just bony oozing in general, but no frank arterial bleeding that we could cauterize.  We then began reaming under direct visualization from a size 42 reamer up to a size 60 with the last reamer under direct fluoroscopy, so we could obtain our depth of reaming, our inclination, and anteversion.  We then placed  the real DePuy Sector Gription acetabular component size 60 and a 36 neutral polyethylene liner for that size acetabular component.  Attention was then turned to the femur.  With the leg externally rotated, extended, and abducted, we were able to place a bent Hohmann medially and a Hohmann retractor behind the greater trochanter.  I released the lateral joint capsule, used a box cutting osteotome to enter the femoral canal and a rongeur to lateralize.  We then began broaching from a size 8 broach up to a size 13.  With a size 13 in place, we trialed a standard offset femoral neck and a 36+ 1.5 hip ball, reduced this in acetabulum and it was stable. We were pleased with leg length, offset, range of motion.  We tried to remove any more applied periarticular osteophytes.  We then removed the trial components.  We then placed the real Corail femoral component, size 13 and the real 36+ 1.5 hip ball and reduced this in the acetabulum and again it was stable.  We then irrigated the soft tissues with normal saline solution using pulsatile lavage.  We closed the joint capsule with interrupted #1 Ethibond suture.  We still experienced a significant amount of oozing and with him having some intraoperative hypotension, Anesthesia did order unit of packed red blood cells.  However, given his antibodies, he was not given this in the OR, but his blood pressure did normalize.  We then closed the iliotibial or the tensor fascia with interrupted #1 Vicryl suture followed by 0 Vicryl in the deep tissue, 2- 0 Vicryl in the subcutaneous tissue, interrupted staples on the skin. Xeroform and Aquacel dressing was applied.  He was taken off the Hana table, awakened, extubated, and taken to recovery room in guarded condition.     Lind Guest. Ninfa Linden, M.D.     CYB/MEDQ  D:  03/11/2016  T:  03/11/2016  Job:  [id number]

## 2016-03-11 NOTE — Anesthesia Procedure Notes (Signed)
Procedure Name: LMA Insertion Date/Time: 03/11/2016 12:16 PM Performed by: Freddie Breech Pre-anesthesia Checklist: Patient identified, Emergency Drugs available, Suction available, Patient being monitored and Timeout performed Patient Re-evaluated:Patient Re-evaluated prior to inductionOxygen Delivery Method: Circle system utilized Preoxygenation: Pre-oxygenation with 100% oxygen Intubation Type: IV induction LMA: LMA inserted LMA Size: 4.0 Number of attempts: 1 Airway Equipment and Method: Patient positioned with wedge pillow Placement Confirmation: positive ETCO2,  CO2 detector and breath sounds checked- equal and bilateral Tube secured with: Tape Dental Injury: Teeth and Oropharynx as per pre-operative assessment

## 2016-03-11 NOTE — Progress Notes (Signed)
Neo gtt restarterd per CRNA

## 2016-03-11 NOTE — Anesthesia Preprocedure Evaluation (Signed)
Anesthesia Evaluation  Patient identified by MRN, date of birth, ID band Patient awake    Reviewed: Allergy & Precautions, NPO status , Patient's Chart, lab work & pertinent test results  Airway Mallampati: II  TM Distance: >3 FB Neck ROM: Full    Dental no notable dental hx.    Pulmonary neg pulmonary ROS,    Pulmonary exam normal breath sounds clear to auscultation       Cardiovascular hypertension, Normal cardiovascular exam+ dysrhythmias Atrial Fibrillation  Rhythm:Regular Rate:Normal     Neuro/Psych negative neurological ROS  negative psych ROS   GI/Hepatic negative GI ROS, Neg liver ROS,   Endo/Other  negative endocrine ROS  Renal/GU negative Renal ROS  negative genitourinary   Musculoskeletal negative musculoskeletal ROS (+)   Abdominal   Peds negative pediatric ROS (+)  Hematology negative hematology ROS (+)   Anesthesia Other Findings   Reproductive/Obstetrics negative OB ROS                             Anesthesia Physical Anesthesia Plan  ASA: III  Anesthesia Plan: Spinal   Post-op Pain Management:    Induction: Intravenous  Airway Management Planned: Simple Face Mask  Additional Equipment:   Intra-op Plan:   Post-operative Plan:   Informed Consent: I have reviewed the patients History and Physical, chart, labs and discussed the procedure including the risks, benefits and alternatives for the proposed anesthesia with the patient or authorized representative who has indicated his/her understanding and acceptance.   Dental advisory given  Plan Discussed with: CRNA and Surgeon  Anesthesia Plan Comments:         Anesthesia Quick Evaluation

## 2016-03-11 NOTE — Progress Notes (Signed)
PHARMACIST - PHYSICIAN ORDER COMMUNICATION  CONCERNING: P&T Medication Policy on Herbal Medications  DESCRIPTION:  This patient's order for:  turmeric  has been noted.  This product(s) is classified as an "herbal" or natural product. Due to a lack of definitive safety studies or FDA approval, nonstandard manufacturing practices, plus the potential risk of unknown drug-drug interactions while on inpatient medications, the Pharmacy and Therapeutics Committee does not permit the use of "herbal" or natural products of this type within [facility].   ACTION TAKEN: The pharmacy department is unable to verify this order at this time and the order has been discontinued. Please reevaluate patient's clinical condition at discharge and address if the herbal or natural product(s) should be resumed at that time.  Royetta Asal, PharmD, BCPS Pager [phone] 03/11/2016 3:23 PM

## 2016-03-11 NOTE — Transfer of Care (Addendum)
Immediate Anesthesia Transfer of Care Note  Patient: Adam Tapia  Procedure(s) Performed: Procedure(s) with comments: LEFT TOTAL HIP ARTHROPLASTY ANTERIOR APPROACH (Left) - Went from Spinal to an LMA  Patient Location: PACU  Anesthesia Type:General  Level of Consciousness:  sedated, patient cooperative and responds to stimulation  Airway & Oxygen Therapy:Patient Spontanous Breathing and Patient connected to face mask oxgen  Post-op Assessment:  Report given to PACU RN and Post -op Vital signs reviewed and stable  Post vital signs:  Reviewed.  Dr. Kalman Shan made aware of need to administer IV bolus IV phenylephrine.  Pt started to on Phenylephrine gtt in PACU.  Dr. Kalman Shan made aware of need to initiate IV infusion of phenylephrine.    Last Vitals: There were no vitals filed for this visit.  Complications: No apparent anesthesia complications

## 2016-03-11 NOTE — Anesthesia Postprocedure Evaluation (Signed)
Anesthesia Post Note  Patient: Adam Tapia  Procedure(s) Performed: Procedure(s) (LRB): LEFT TOTAL HIP ARTHROPLASTY ANTERIOR APPROACH (Left)  Patient location during evaluation: PACU Anesthesia Type: General and Spinal Level of consciousness: awake and alert Pain management: pain level controlled Vital Signs Assessment: post-procedure vital signs reviewed and stable Respiratory status: spontaneous breathing, nonlabored ventilation, respiratory function stable and patient connected to nasal cannula oxygen Cardiovascular status: blood pressure returned to baseline and stable Postop Assessment: no signs of nausea or vomiting Anesthetic complications: no    Last Vitals:  Filed Vitals:   03/11/16 1500 03/11/16 1515  BP: 111/60 115/53  Pulse: 66 67  Temp:  36.5 C  Resp: 16 16    Last Pain:  Filed Vitals:   03/11/16 1532  PainSc: 2                  Johnmark Geiger S

## 2016-03-11 NOTE — Brief Op Note (Signed)
03/11/2016  1:10 PM  PATIENT:  Adam Tapia  82 y.o. male  PRE-OPERATIVE DIAGNOSIS:  severe osteoarthritis left hip  POST-OPERATIVE DIAGNOSIS:  severe osteoarthritis left hip  PROCEDURE:  Procedure(s) with comments: LEFT TOTAL HIP ARTHROPLASTY ANTERIOR APPROACH (Left) - Went from Spinal to an LMA  SURGEON:  Surgeon(s) and Role:    * Mcarthur Rossetti, MD - Primary  ANESTHESIA:   spinal and general  EBL:  Total I/O In: 2550 [I.V.:2300; IV Piggyback:250] Out: 1000 [Blood:1000]  COUNTS:  YES  TOURNIQUET:  * No tourniquets in log *  DICTATION: .Other Dictation: Dictation Number [id number]  PLAN OF CARE: Admit to inpatient   PATIENT DISPOSITION:  PACU - guarded condition.   Delay start of Pharmacological VTE agent (>24hrs) due to surgical blood loss or risk of bleeding: yes

## 2016-03-11 NOTE — H&P (Signed)
TOTAL HIP ADMISSION H&P  Patient is admitted for left total hip arthroplasty.  Subjective:  Chief Complaint: left hip pain  HPI: Adam Tapia, 82 y.o. male, has a history of pain and functional disability in the left hip(s) due to arthritis and patient has failed non-surgical conservative treatments for greater than 12 weeks to include NSAID's and/or analgesics, flexibility and strengthening excercises, use of assistive devices and activity modification.  Onset of symptoms was gradual starting >10 years ago with gradually worsening course since that time.The patient noted no past surgery on the left hip(s).  Patient currently rates pain in the left hip at 10 out of 10 with activity. Patient has night pain, worsening of pain with activity and weight bearing, trendelenberg gait, pain that interfers with activities of daily living, pain with passive range of motion and crepitus. Patient has evidence of subchondral cysts, subchondral sclerosis, periarticular osteophytes and joint space narrowing by imaging studies. This condition presents safety issues increasing the risk of falls.  There is no current active infection.  Patient Active Problem List   Diagnosis Date Noted  . Osteoarthritis of left hip 03/11/2016  . Myeloproliferative disorder ([place]) 08/27/2014  . Leukocytosis 06/03/2013   Past Medical History  Diagnosis Date  . Hypertension   . Dysrhythmia     PT STATES HAS HX A FIB - FOLLOWED BY DR.KEVIN LITTLE  . Arthritis   . Psoriasis   . GERD (gastroesophageal reflux disease)     OCCASIONAL - HAS NEXIUM IF NEEDED - NOT CURRENTLY TAKING  . BPH (benign prostatic hyperplasia)   . Nocturia   . Anemia   . Myeloproliferative disorder ([place])     FOLLOWED BY DR. Julien Nordmann  AT [facility]    Past Surgical History  Procedure Laterality Date  . Hernia repair  1998    No prescriptions prior to admission   No Known Allergies  Social History  Substance Use Topics  . Smoking status: Never  Smoker   . Smokeless tobacco: Not on file  . Alcohol Use: No    No family history on file.   Review of Systems  Musculoskeletal: Positive for joint pain.  All other systems reviewed and are negative.   Objective:  Physical Exam  Constitutional: He is oriented to person, place, and time. He appears well-developed and well-nourished.  HENT:  Head: Normocephalic and atraumatic.  Eyes: EOM are normal. Pupils are equal, round, and reactive to light.  Neck: Normal range of motion. Neck supple.  Cardiovascular: Normal rate and regular rhythm.   Respiratory: Effort normal and breath sounds normal.  GI: Soft. Bowel sounds are normal.  Musculoskeletal:       Left hip: He exhibits decreased range of motion, decreased strength, tenderness, bony tenderness and crepitus.  Neurological: He is alert and oriented to person, place, and time.  Skin: Skin is warm and dry.  Psychiatric: He has a normal mood and affect.    Vital signs in last 24 hours:    Labs:   Estimated body mass index is 26.77 kg/(m^2) as calculated from the following:   Height as of 02/02/16: 5\' 8"  (1.727 m).   Weight as of 02/02/16: 79.833 kg (176 lb).   Imaging Review Plain radiographs demonstrate severe degenerative joint disease of the left hip(s). The bone quality appears to be good for age and reported activity level.  Assessment/Plan:  End stage arthritis, left hip(s)  The patient history, physical examination, clinical judgement of the provider and imaging studies are consistent  with end stage degenerative joint disease of the left hip(s) and total hip arthroplasty is deemed medically necessary. The treatment options including medical management, injection therapy, arthroscopy and arthroplasty were discussed at length. The risks and benefits of total hip arthroplasty were presented and reviewed. The risks due to aseptic loosening, infection, stiffness, dislocation/subluxation,  thromboembolic complications and  other imponderables were discussed.  The patient acknowledged the explanation, agreed to proceed with the plan and consent was signed. Patient is being admitted for inpatient treatment for surgery, pain control, PT, OT, prophylactic antibiotics, VTE prophylaxis, progressive ambulation and ADL's and discharge planning.The patient is planning to be discharged to skilled nursing facility

## 2016-03-12 LAB — CBC WITH DIFFERENTIAL/PLATELET
Basophils Absolute: 0 10*3/uL (ref 0.0–0.1)
Basophils Relative: 0 %
EOS ABS: 0 10*3/uL (ref 0.0–0.7)
EOS PCT: 0 %
HEMATOCRIT: 23.6 % — AB (ref 39.0–52.0)
Hemoglobin: 8.1 g/dL — ABNORMAL LOW (ref 13.0–17.0)
LYMPHS ABS: 2 10*3/uL (ref 0.7–4.0)
LYMPHS PCT: 8 %
MCH: 33.3 pg (ref 26.0–34.0)
MCHC: 34.3 g/dL (ref 30.0–36.0)
MCV: 97.1 fL (ref 78.0–100.0)
MONO ABS: 2.5 10*3/uL — AB (ref 0.1–1.0)
MONOS PCT: 10 %
Neutro Abs: 20.9 10*3/uL — ABNORMAL HIGH (ref 1.7–7.7)
Neutrophils Relative %: 82 %
Platelets: 319 10*3/uL (ref 150–400)
RBC: 2.43 MIL/uL — ABNORMAL LOW (ref 4.22–5.81)
RDW: 17.1 % — AB (ref 11.5–15.5)
WBC: 25.4 10*3/uL — ABNORMAL HIGH (ref 4.0–10.5)

## 2016-03-12 LAB — PREPARE RBC (CROSSMATCH)

## 2016-03-12 LAB — BASIC METABOLIC PANEL
ANION GAP: 6 (ref 5–15)
BUN: 20 mg/dL (ref 4–21)
BUN: 20 mg/dL (ref 6–20)
CHLORIDE: 104 mmol/L (ref 101–111)
CO2: 25 mmol/L (ref 22–32)
CREATININE: 0.9 mg/dL (ref 0.6–1.3)
Calcium: 9.1 mg/dL (ref 8.9–10.3)
Creatinine, Ser: 0.94 mg/dL (ref 0.61–1.24)
GFR calc Af Amer: >60 mL/min (ref >60)
GLUCOSE: 113 mg/dL — AB (ref 65–99)
Glucose: 113 mg/dL
POTASSIUM: 4.5 mmol/L (ref 3.5–5.1)
Sodium: 135 mmol/L (ref 135–145)
Sodium: 135 mmol/L — AB (ref 137–147)

## 2016-03-12 MED ORDER — SODIUM CHLORIDE 0.9 % IV SOLN
Freq: Once | INTRAVENOUS | Status: AC
  Administered 2016-03-12: 11:00:00 via INTRAVENOUS

## 2016-03-12 MED ORDER — FUROSEMIDE 10 MG/ML IJ SOLN
20.0000 mg | Freq: Once | INTRAMUSCULAR | Status: AC
  Administered 2016-03-12: 20 mg via INTRAVENOUS
  Filled 2016-03-12: qty 2

## 2016-03-12 NOTE — Evaluation (Addendum)
Occupational Therapy Evaluation Patient Details Name: Adam Tapia MRN: [id number] DOB: June 27, 1923 Today's Date: 03/12/2016    History of Present Illness L THR - ant dir   Clinical Impression   Pt currently requiring +2 assist for ADL. Depending on progress, may need to consider SNF unless able to progress to a level that family is able to safely assist for return home. Will continue to follow to monitor progress and help determine safest d/c recommendation.     Follow Up Recommendations  Home health OT;SNF;Supervision/Assistance - 24 hour    Equipment Recommendations  3 in 1 bedside comode;Tub/shower bench (pt deciding on if he would like a tubbench)    Recommendations for Other Services       Precautions / Restrictions Precautions Precautions: Fall Restrictions Weight Bearing Restrictions: No Other Position/Activity Restrictions: WBAT      Mobility Bed Mobility Overal bed mobility: Needs Assistance;+2 for physical assistance Bed Mobility: Supine to Sit     Supine to sit: Mod assist     General bed mobility comments: cues for sequence and use of R LE to self assist.  Physical assist to manage R LE and to brign trunk to upright and balance in initial sitting  Transfers Overall transfer level: Needs assistance Equipment used: Rolling walker (2 wheeled) Transfers: Sit to/from Stand Sit to Stand: Min assist;Mod assist;+2 physical assistance;+2 safety/equipment         General transfer comment: cues for LE management and use of UEs to self assist    Balance                                            ADL Overall ADL's : Needs assistance/impaired Eating/Feeding: Independent;Sitting   Grooming: Wash/dry hands;Set up;Sitting   Upper Body Bathing: Set up;Sitting   Lower Body Bathing: +2 for physical assistance;+2 for safety/equipment;Maximal assistance;Sit to/from stand   Upper Body Dressing : Moderate assistance;Sitting   Lower Body  Dressing: +2 for physical assistance;Total assistance;+2 for safety/equipment;Sit to/from stand   Toilet Transfer: +2 for physical assistance;+2 for safety/equipment;Moderate assistance;RW;Ambulation;Minimal assistance   Toileting- Clothing Manipulation and Hygiene: +2 for physical assistance;+2 for safety/equipment;Total assistance;Sit to/from stand         General ADL Comments: Pt with some LOB sitting on edge of bed initially but improved as he sat longer. Discussed tub bench options and significant other and pt would like to consider this option versus sponge bathing. They are interested in a 3in1 for over the commode.      Vision     Perception     Praxis      Pertinent Vitals/Pain Pain Assessment: 0-10 Pain Score: 4  Pain Location: L hip Pain Descriptors / Indicators: Aching Pain Intervention(s): Repositioned;Monitored during session;Ice applied     Hand Dominance     Extremity/Trunk Assessment Upper Extremity Assessment Upper Extremity Assessment: Overall WFL for tasks assessed      Cervical / Trunk Assessment Cervical / Trunk Assessment: Normal   Communication Communication Communication: No difficulties   Cognition Arousal/Alertness: Awake/alert Behavior During Therapy: WFL for tasks assessed/performed Overall Cognitive Status: Within Functional Limits for tasks assessed                     General Comments       Exercises Exercises: Total Joint     Shoulder Instructions      Home Living  Family/patient expects to be discharged to:: Private residence Living Arrangements: Spouse/significant other Available Help at Discharge: Family Type of Home: House Home Access: Stairs to enter Technical brewer of Steps: 3 Entrance Stairs-Rails: Right Home Layout: One level     Bathroom Shower/Tub: Teacher, early years/pre: [place]: Environmental consultant - 2 wheels   Additional Comments: Pt is considering possible [facility].  Sig other is checking walker type      Prior Functioning/Environment Level of Independence: Independent;Independent with assistive device(s);Needs assistance    ADL's / Homemaking Assistance Needed: has needed some assist with ADL in the last week before surgery.   Comments: I used a stick to get around because of my hip    OT Diagnosis: Generalized weakness   OT Problem List: Decreased strength;Decreased knowledge of use of DME or AE   OT Treatment/Interventions: Self-care/ADL training;DME and/or AE instruction;Therapeutic activities;Patient/family education    OT Goals(Current goals can be found in the care plan section) Acute Rehab OT Goals Patient Stated Goal: increase independence  OT Goal Formulation: With patient Time For Goal Achievement: 03/19/16 Potential to Achieve Goals: Good  OT Frequency: Min 2X/week   Barriers to D/C:            Co-evaluation PT/OT/SLP Co-Evaluation/Treatment: Yes Reason for Co-Treatment: For patient/therapist safety   OT goals addressed during session: ADL's and self-care;Proper use of Adaptive equipment and DME      End of Session Equipment Utilized During Treatment: Rolling walker;Gait belt  Activity Tolerance: Patient tolerated treatment well Patient left: in chair;with call bell/phone within reach;with family/visitor present   Time: [id number] OT Time Calculation (min): 34 min Charges:  OT General Charges $OT Visit: 1 Procedure OT Evaluation $OT Eval Low Complexity: 1 Procedure G-Codes:    Jules Schick  [id number] 03/12/2016, 4:18 PM

## 2016-03-12 NOTE — Progress Notes (Signed)
PT Cancellation Note  Patient Details Name: IZEK TALSMA MRN: [id number] DOB: 10/20/1923   Cancelled Treatment:     PT order received but eval deferred this am - blood transfusion being initiated.  Will follow in pm.   Loretta Kluender 03/12/2016, 12:59 PM

## 2016-03-12 NOTE — Progress Notes (Signed)
Foley discontinued at 06:45am this morning, pt due to void.

## 2016-03-12 NOTE — Progress Notes (Signed)
Subjective:    Objective: Vital signs in last 24 hours: Temp:  [97.5 F (36.4 C)-98.3 F (36.8 C)] 98.3 F (36.8 C) (04/22 0530) Pulse Rate:  [55-74] 55 (04/22 0530) Resp:  [11-25] 16 (04/22 0530) BP: (84-160)/(49-73) 122/55 mmHg (04/22 0530) SpO2:  [96 %-100 %] 97 % (04/22 0530) Weight:  [77.565 kg (171 lb)] 77.565 kg (171 lb) (04/21 1003)  Intake/Output from previous day: 04/21 0701 - 04/22 0700 In: 3725 [P.O.:240; I.V.:2600; Blood:635; IV Piggyback:250] Out: 2700 [Urine:1700; Blood:1000] Intake/Output this shift:    Exam:  Neurovascular intact Sensation intact distally Intact pulses distally  Labs:  Recent Labs  03/12/16 0412  HGB 8.1*    Recent Labs  03/12/16 0412  WBC 25.4*  RBC 2.43*  HCT 23.6*  PLT 319    Recent Labs  03/12/16 0412  NA 135  K 4.5  CL 104  CO2 25  BUN 20  CREATININE 0.94  GLUCOSE 113*  CALCIUM 9.1   No results for input(s): LABPT, INR in the last 72 hours.  Assessment/Plan: Patient stable doing well leg lengths equal hemoglobin slightly low will need to recheck tomorrow   DEAN,GREGORY SCOTT 03/12/2016, 7:23 AM

## 2016-03-12 NOTE — Care Management Note (Signed)
Case Management Note  Patient Details  Name: Adam Tapia MRN: [id number] Date of Birth: 10-30-1923  Subjective/Objective:      LEFT TOTAL HIP ARTHROPLASTY               Action/Plan: Discharge Planning:  NCM spoke to pt and dtr, Adam Tapia # [phone], wife's Adam Tapia # [phone]. Pt agreeable to SNF-rehab. Prefers [facility]. Contacted CSW for referral to SNF. Pt has RW and 3n1 bedside commode at home. Lives at home with wife but she is not able to lift him.   PCP- [name] MD  Expected Discharge Date:  03/14/2016              Expected Discharge Plan:  [facility]  In-House Referral:  NA  Discharge planning Services  CM Consult  Post Acute Care Choice:  Home Health Choice offered to:  Patient  DME Arranged:  N/A DME Agency:  NA  HH Arranged:  NA HH Agency:  NA, [facility]  Status of Service:  Completed, signed off  Medicare Important Message Given:    Date Medicare IM Given:    Medicare IM give by:    Date Additional Medicare IM Given:    Additional Medicare Important Message give by:     If discussed at [place] of Stay Meetings, dates discussed:    Additional Comments:  Erenest Rasher, RN 03/12/2016, 4:45 PM

## 2016-03-12 NOTE — Evaluation (Signed)
Physical Therapy Evaluation Patient Details Name: Adam Tapia MRN: [id number] DOB: 04/25/1923 Today's Date: 03/12/2016   History of Present Illness  L THR - ant dir  Clinical Impression  Pt s/p L THR presents with decreased L LE strength/ROM and post op pain limiting functional mobility.  Pt hopes to progress to dc home with family assist but willing to consider short term SNF level rehab dependent on acute stay progress.    Follow Up Recommendations Home health PT;SNF (Dependent on acute stay progress)    Equipment Recommendations  None recommended by PT    Recommendations for Other Services OT consult     Precautions / Restrictions Precautions Precautions: Fall Restrictions Weight Bearing Restrictions: No Other Position/Activity Restrictions: WBAT      Mobility  Bed Mobility Overal bed mobility: Needs Assistance;+2 for physical assistance Bed Mobility: Supine to Sit     Supine to sit: Mod assist     General bed mobility comments: cues for sequence and use of R LE to self assist.  Physical assist to manage R LE and to brign trunk to upright and balance in initial sitting  Transfers Overall transfer level: Needs assistance Equipment used: Rolling walker (2 wheeled) Transfers: Sit to/from Stand Sit to Stand: Min assist;Mod assist;+2 physical assistance;+2 safety/equipment         General transfer comment: cues for LE management and use of UEs to self assist  Ambulation/Gait Ambulation/Gait assistance: Min assist;+2 safety/equipment Ambulation Distance (Feet): 36 Feet Assistive device: Rolling walker (2 wheeled) Gait Pattern/deviations: Step-to pattern;Decreased step length - right;Decreased step length - left;Shuffle;Trunk flexed Gait velocity: decr   General Gait Details: cues for sequence, posture and position from [employer]            Wheelchair Mobility    Modified Rankin (Stroke Patients Only)       Balance                                              Pertinent Vitals/Pain Pain Assessment: 0-10 Pain Score: 4  Pain Location: L hip Pain Descriptors / Indicators: Aching;Sore Pain Intervention(s): Limited activity within patient's tolerance;Monitored during session;Ice applied (Pt declined pre-med)    Home Living Family/patient expects to be discharged to:: Private residence Living Arrangements: Spouse/significant other Available Help at Discharge: Family Type of Home: House Home Access: Stairs to enter Entrance Stairs-Rails: Right Entrance Stairs-Number of Steps: 3 Home Layout: One [place]: [place] - 2 wheels Additional Comments: Pt is considering possible [facility].  Sig other is checking walker type    Prior Function Level of Independence: Independent;Independent with assistive device(s)         Comments: I used a stick to get around because of my hip     Hand Dominance        Extremity/Trunk Assessment   Upper Extremity Assessment: Overall WFL for tasks assessed           Lower Extremity Assessment: LLE deficits/detail   LLE Deficits / Details: Strength at hip 2+/5 with AAROM at  hip to 80 flex and 20 abd  Cervical / Trunk Assessment: Normal  Communication   Communication: No difficulties  Cognition Arousal/Alertness: Awake/alert Behavior During Therapy: WFL for tasks assessed/performed Overall Cognitive Status: Within Functional Limits for tasks assessed  General Comments      Exercises Total Joint Exercises Ankle Circles/Pumps: AROM;Both;15 reps;Supine Quad Sets: AROM;Both;10 reps;Supine Heel Slides: AAROM;15 reps;Supine;Left Hip ABduction/ADduction: AAROM;15 reps;Supine;Left      Assessment/Plan    PT Assessment Patient needs continued PT services  PT Diagnosis Difficulty walking   PT Problem List Decreased strength;Decreased range of motion;Decreased activity tolerance;Decreased mobility;Decreased knowledge of  use of DME;Pain  PT Treatment Interventions DME instruction;Gait training;Stair training;Functional mobility training;Therapeutic activities;Therapeutic exercise;Patient/family education   PT Goals (Current goals can be found in the Care Plan section) Acute Rehab PT Goals Patient Stated Goal: Regain IND and walk without pain PT Goal Formulation: With patient Time For Goal Achievement: 03/18/16 Potential to Achieve Goals: Good    Frequency 7X/week   Barriers to discharge        Co-evaluation PT/OT/SLP Co-Evaluation/Treatment: Yes Reason for Co-Treatment: For patient/therapist safety   OT goals addressed during session: ADL's and self-care;Proper use of Adaptive equipment and DME       End of Session Equipment Utilized During Treatment: Gait belt Activity Tolerance: Patient tolerated treatment well Patient left: in chair;with call bell/phone within reach;with family/visitor present Nurse Communication: Mobility status         Time: [id number] PT Time Calculation (min) (ACUTE ONLY): 40 min   Charges:   PT Evaluation $PT Eval Low Complexity: 1 Procedure PT Treatments $Gait Training: 8-22 mins   PT G Codes:        Ermon Sagan 31-Mar-2016, 4:04 PM

## 2016-03-13 LAB — CBC
HEMATOCRIT: 27 % — AB (ref 39.0–52.0)
HEMOGLOBIN: 9.4 g/dL — AB (ref 13.0–17.0)
MCH: 33 pg (ref 26.0–34.0)
MCHC: 34.8 g/dL (ref 30.0–36.0)
MCV: 94.7 fL (ref 78.0–100.0)
Platelets: 311 10*3/uL (ref 150–400)
RBC: 2.85 MIL/uL — ABNORMAL LOW (ref 4.22–5.81)
RDW: 16.3 % — AB (ref 11.5–15.5)
WBC: 32.3 10*3/uL — ABNORMAL HIGH (ref 4.0–10.5)

## 2016-03-13 NOTE — Progress Notes (Signed)
   03/13/16 1400  PT Visit Information  Last PT Received On 03/13/16  Pt states he wants to go home, wife seems concerned about this--pt is progressing well; will need to practice 4 steps if D/Cs home tomorrow (vs SNF  Assistance Needed +1  History of Present Illness L THR - ant dir  Subjective Data  Subjective I want to go home  Patient Stated Goal increase independence   Precautions  Precautions Fall  Restrictions  Other Position/Activity Restrictions WBAT  Pain Assessment  Pain Assessment 0-10  Pain Score 2  Pain Location L hip  Pain Descriptors / Indicators Sore  Pain Intervention(s) Limited activity within patient's tolerance;Monitored during session;Premedicated before session;Repositioned  Cognition  Arousal/Alertness Awake/alert  Behavior During Therapy WFL for tasks assessed/performed  Overall Cognitive Status Within Functional Limits for tasks assessed  Bed Mobility  General bed mobility comments ( in chair)  Transfers  Overall transfer level Needs assistance  Equipment used Rolling walker (2 wheeled)  Transfers Sit to/from Stand  Sit to Stand Min guard;Supervision  General transfer comment cues for LE management UE placement, safety and control of descent  Ambulation/Gait  Ambulation/Gait assistance Min guard  Ambulation Distance (Feet) 140 Feet  Assistive device Rolling walker (2 wheeled)  Gait Pattern/deviations Step-through pattern  General Gait Details cues for sequence, posture and position from RW  Gait velocity decr  Total Joint Exercises  Ankle Circles/Pumps AROM;Both;15 reps;Supine  Heel Slides AAROM;15 reps;Supine;Left  Hip ABduction/ADduction AAROM;15 reps;Supine;Left  PT - End of Session  Equipment Utilized During Treatment Gait belt  Activity Tolerance Patient tolerated treatment well  Patient left in chair;with call bell/phone within reach;with chair alarm set  Nurse Communication Mobility status  PT - Assessment/Plan  PT Plan Current plan  remains appropriate  PT Frequency (ACUTE ONLY) 7X/week  Follow Up Recommendations Home health PT;SNF  PT equipment None recommended by PT (family checking on DME at home)  PT Goal Progression  Progress towards PT goals Progressing toward goals  Acute Rehab PT Goals  PT Goal Formulation With patient  Time For Goal Achievement 03/18/16  Potential to Achieve Goals Good  PT Time Calculation  PT Start Time (ACUTE ONLY) 1440  PT Stop Time (ACUTE ONLY) 1457  PT Time Calculation (min) (ACUTE ONLY) 17 min  PT General Charges  $$ ACUTE PT VISIT 1 Procedure  PT Treatments  $Gait Training 8-22 mins

## 2016-03-13 NOTE — Progress Notes (Signed)
Physical Therapy Treatment Patient Details Name: Adam Tapia MRN: [id number] DOB: 1923/08/02 Today's Date: 2016/03/23    History of Present Illness L THR - ant dir    PT Comments    Pt progressing well; pt states to PT  That he wants to go home rather than SNF; feel pt should  Be able to go home based on today's progress  Follow Up Recommendations  Home health PT;SNF     Equipment Recommendations  None recommended by PT    Recommendations for Other Services       Precautions / Restrictions Precautions Precautions: Fall Restrictions Weight Bearing Restrictions: No Other Position/Activity Restrictions: WBAT    Mobility  Bed Mobility Overal bed mobility: Needs Assistance;+2 for physical assistance Bed Mobility: Supine to Sit     Supine to sit: Min assist     General bed mobility comments: cues for  sequence, incr time  Transfers Overall transfer level: Needs assistance Equipment used: Rolling walker (2 wheeled) Transfers: Sit to/from Stand Sit to Stand: Min assist Stand pivot transfers: Min assist       General transfer comment: cues for LE management and use of UEs to self assist  Ambulation/Gait Ambulation/Gait assistance: Min assist;Min guard Ambulation Distance (Feet): 120 Feet Assistive device: Rolling walker (2 wheeled) Gait Pattern/deviations: Step-to pattern;Step-through pattern;Decreased stride length     General Gait Details: cues for sequence, posture and position from [employer]            Wheelchair Mobility    Modified Rankin (Stroke Patients Only)       Balance                                    Cognition Arousal/Alertness: Awake/alert Behavior During Therapy: WFL for tasks assessed/performed Overall Cognitive Status: Within Functional Limits for tasks assessed                      Exercises Total Joint Exercises Ankle Circles/Pumps: AROM;Both;15 reps;Supine Quad Sets: AROM;Both;10  reps;Supine    General Comments        Pertinent Vitals/Pain Pain Assessment: 0-10 Pain Score: 4  Pain Location: L hip Pain Descriptors / Indicators: Aching Pain Intervention(s): Limited activity within patient's tolerance;Monitored during session;Ice applied    Home Living                      Prior Function            PT Goals (current goals can now be found in the care plan section) Acute Rehab PT Goals Patient Stated Goal: increase independence  Time For Goal Achievement: 03/18/16 Potential to Achieve Goals: Good Progress towards PT goals: Progressing toward goals    Frequency  7X/week    PT Plan Current plan remains appropriate    Co-evaluation             End of Session Equipment Utilized During Treatment: Gait belt Activity Tolerance: Patient tolerated treatment well Patient left: in chair;with call bell/phone within reach;with chair alarm set     Time: 1015-1030 PT Time Calculation (min) (ACUTE ONLY): 15 min  Charges:  $Gait Training: 8-22 mins                    G Codes:      Adam Tapia 23-Mar-2016, 1:43 PM

## 2016-03-13 NOTE — Progress Notes (Signed)
Subjective: Patient stable states his hip is doing well but he is concerned about some hallucinations he was having last night   Objective: Vital signs in last 24 hours: Temp:  [98 F (36.7 C)-99.2 F (37.3 C)] 99 F (37.2 C) (04/23 0555) Pulse Rate:  [65-89] 74 (04/23 1018) Resp:  [16-20] 18 (04/23 0555) BP: (108-161)/(46-67) 146/67 mmHg (04/23 0555) SpO2:  [95 %-100 %] 95 % (04/23 0555)  Intake/Output from previous day: 04/22 0701 - 04/23 0700 In: 3094.3 [P.O.:960; I.V.:1586.3; Blood:548] Out: 1775 [Urine:1775] Intake/Output this shift:    Exam:  Neurovascular intact Sensation intact distally Intact pulses distally  Labs:  Recent Labs  03/12/16 0412 03/13/16 0423  HGB 8.1* 9.4*    Recent Labs  03/12/16 0412 03/13/16 0423  WBC 25.4* 32.3*  RBC 2.43* 2.85*  HCT 23.6* 27.0*  PLT 319 311    Recent Labs  03/12/16 0412  NA 135  K 4.5  CL 104  CO2 25  BUN 20  CREATININE 0.94  GLUCOSE 113*  CALCIUM 9.1   No results for input(s): LABPT, INR in the last 72 hours.  Assessment/Plan: Impression this patient's doing well following hip replacement no pain with range of motion of the hip some of these hallucinations may be related to pain medicine or potentially early sundowning we'll see how he does tonight plan for discharge to skilled nursing tomorrow   Peck 03/13/2016, 11:03 AM

## 2016-03-13 NOTE — Progress Notes (Signed)
Occupational Therapy Treatment Patient Details Name: Adam Tapia MRN: [id number] DOB: Mar 23, 1923 Today's Date: 03/13/2016    History of present illness L THR - ant dir   OT comments  Pt progressing well with OT.  He requires min - mod A for ADLs and wife able to assist as needed at discharge.  He prefers to discharge home and feel this is a realistic option.   Follow Up Recommendations  Home health OT;SNF;Supervision/Assistance - 24 hour    Equipment Recommendations  3 in 1 bedside comode;Tub/shower bench    Recommendations for Other Services      Precautions / Restrictions Precautions Precautions: Fall Restrictions Weight Bearing Restrictions: No       Mobility Bed Mobility                  Transfers Overall transfer level: Needs assistance Equipment used: Rolling walker (2 wheeled) Transfers: Sit to/from [facility] Sit to Stand: Min assist Stand pivot transfers: Min assist            Balance                                   ADL Overall ADL's : Needs assistance/impaired             Lower Body Bathing: Minimal assistance;Sit to/from stand       Lower Body Dressing: Moderate assistance;Sit to/from stand Lower Body Dressing Details (indicate cue type and reason): Not yet able to access Lt foot, but utilizes many adapted techniques at home.  He feels confident in his ability to progress back to previous methods and reports wife will assist as needed.  Discussed AE with him, but he is not interested  Toilet Transfer: Minimal assistance;Ambulation;Comfort height toilet;Regular Toilet;Grab bars;RW   Toileting- Clothing Manipulation and Hygiene: Minimal assistance;Sit to/from Nurse, children's Details (indicate cue type and reason): simulated stepping over tub with min A           Vision                     Perception     Praxis      Cognition   Behavior During Therapy: WFL for tasks  assessed/performed Overall Cognitive Status: Within Functional Limits for tasks assessed                       Extremity/Trunk Assessment               Exercises     Shoulder Instructions       General Comments      Pertinent Vitals/ Pain       Pain Assessment: 0-10 Pain Score: 4  Pain Location: Lt hip Pain Descriptors / Indicators: Aching Pain Intervention(s): Repositioned  Home Living                                          Prior Functioning/Environment              Frequency Min 2X/week     Progress Toward Goals  OT Goals(current goals can now be found in the care plan section)     ADL Goals Pt Will Perform Lower Body Dressing: with min assist;with adaptive equipment;sit to/from stand Pt Will Transfer to Toilet:  with min assist;ambulating;bedside commode Pt Will Perform Toileting - Clothing Manipulation and hygiene: with min assist;sit to/from stand Pt Will Perform Tub/Shower Transfer: with min assist;Tub transfer;tub bench  Plan Discharge plan remains appropriate    Co-evaluation                 End of Session Equipment Utilized During Treatment: Rolling walker;Gait belt   Activity Tolerance Patient tolerated treatment well   Patient Left in chair;with call bell/phone within reach;with chair alarm set   Nurse Communication Mobility status        Time: [id number] OT Time Calculation (min): 19 min  Charges: OT General Charges $OT Visit: 1 Procedure OT Treatments $Self Care/Home Management : 8-22 mins  Adam Tapia M 03/13/2016, 12:20 PM

## 2016-03-14 LAB — URINALYSIS, ROUTINE W REFLEX MICROSCOPIC
Bilirubin Urine: NEGATIVE
GLUCOSE, UA: NEGATIVE mg/dL
KETONES UR: NEGATIVE mg/dL
LEUKOCYTES UA: NEGATIVE
NITRITE: NEGATIVE
PH: 6.5 (ref 5.0–8.0)
Protein, ur: NEGATIVE mg/dL
SPECIFIC GRAVITY, URINE: 1.013 (ref 1.005–1.030)

## 2016-03-14 LAB — CBC
HCT: 28.7 % — ABNORMAL LOW (ref 39.0–52.0)
HEMOGLOBIN: 9.9 g/dL — AB (ref 13.0–17.0)
MCH: 33.1 pg (ref 26.0–34.0)
MCHC: 34.5 g/dL (ref 30.0–36.0)
MCV: 96 fL (ref 78.0–100.0)
Platelets: 363 10*3/uL (ref 150–400)
RBC: 2.99 MIL/uL — ABNORMAL LOW (ref 4.22–5.81)
RDW: 16.1 % — AB (ref 11.5–15.5)
WBC: 32.5 10*3/uL — AB (ref 4.0–10.5)

## 2016-03-14 LAB — TYPE AND SCREEN
ABO/RH(D): O POS
Antibody Screen: POSITIVE
DAT, IgG: NEGATIVE
UNIT DIVISION: 0
UNIT DIVISION: 0
Unit division: 0
Unit division: 0

## 2016-03-14 LAB — URINE MICROSCOPIC-ADD ON: BACTERIA UA: NONE SEEN

## 2016-03-14 LAB — POCT I-STAT 4, (NA,K, GLUC, HGB,HCT)
Glucose, Bld: 92 mg/dL (ref 65–99)
HEMATOCRIT: 25 % — AB (ref 39.0–52.0)
Hemoglobin: 8.5 g/dL — ABNORMAL LOW (ref 13.0–17.0)
Potassium: 4.5 mmol/L (ref 3.5–5.1)
SODIUM: 137 mmol/L (ref 135–145)

## 2016-03-14 LAB — CBC AND DIFFERENTIAL: WBC: 32.5 10^3/mL

## 2016-03-14 MED ORDER — OXYCODONE HCL 5 MG PO TABS: 5.0000 mg | ORAL_TABLET | ORAL | Status: DC | PRN

## 2016-03-14 MED ORDER — ASPIRIN 325 MG PO TBEC: 325.0000 mg | DELAYED_RELEASE_TABLET | Freq: Every day | ORAL | Status: DC

## 2016-03-14 NOTE — NC FL2 (Signed)
[place] MEDICAID FL2 LEVEL OF CARE SCREENING TOOL     IDENTIFICATION  Patient Name: Adam Tapia Birthdate: September 16, 1923 Sex: male Admission Date (Current Location): 03/11/2016  [facility] and [place] Number:  Herbalist and Address:  [facility],  [address] [place], [place]      Provider Number: [id number]  Attending Physician Name and Address:  Mcarthur Rossetti, *  Relative Name and Phone Number:       Current Level of Care: Hospital Recommended Level of Care: [place] Prior Approval Number:    Date Approved/Denied:   PASRR Number: [id number]  Discharge Plan: SNF    Current Diagnoses: Patient Active Problem List   Diagnosis Date Noted  . Osteoarthritis of left hip 03/11/2016  . Status post total replacement of right hip 03/11/2016  . Myeloproliferative disorder ([place]) 08/27/2014  . Leukocytosis 06/03/2013    Orientation RESPIRATION BLADDER Height & Weight     Self, Time, Situation, Place  Normal Continent Weight: 171 lb (77.565 kg) Height:  5\' 8"  (172.7 cm)  BEHAVIORAL SYMPTOMS/MOOD NEUROLOGICAL BOWEL NUTRITION STATUS   (no behaviors)  (NONE) Continent Diet (Diet Regular)  AMBULATORY STATUS COMMUNICATION OF NEEDS Skin   Limited Assist Verbally Surgical wounds                       Personal Care Assistance Level of Assistance  Bathing, Feeding, Dressing Bathing Assistance: Limited assistance Feeding assistance: Independent Dressing Assistance: Limited assistance     Functional Limitations Info  Sight, Hearing, Speech Sight Info: Adequate Hearing Info: Adequate Speech Info: Adequate    SPECIAL CARE FACTORS FREQUENCY  PT (By licensed PT), OT (By licensed OT)     PT Frequency: 5 x a week OT Frequency: 5 x a week            Contractures Contractures Info: Not present    Additional Factors Info  Code Status, Allergies Code Status Info: FULL code status Allergies Info: No Known  Allergies           Current Medications (03/14/2016):  This is the current hospital active medication list Current Facility-Administered Medications  Medication Dose Route Frequency Provider Last Rate Last Dose  . 0.9 %  sodium chloride infusion   Intravenous Once Cynda Familia, CRNA      . 0.9 %  sodium chloride infusion   Intravenous Continuous Mcarthur Rossetti, MD   Stopped at 03/12/16 1111  . acetaminophen (TYLENOL) tablet 650 mg  650 mg Oral Q6H PRN Mcarthur Rossetti, MD       Or  . acetaminophen (TYLENOL) suppository 650 mg  650 mg Rectal Q6H PRN Mcarthur Rossetti, MD      . alum & mag hydroxide-simeth (MAALOX/MYLANTA) 200-200-20 MG/5ML suspension 30 mL  30 mL Oral Q4H PRN Mcarthur Rossetti, MD   30 mL at 03/14/16 1010  . aspirin EC tablet 325 mg  325 mg Oral Q breakfast Mcarthur Rossetti, MD   325 mg at 03/14/16 0840  . atenolol (TENORMIN) tablet 25 mg  25 mg Oral Daily Mcarthur Rossetti, MD   25 mg at 03/14/16 1006  . digoxin (LANOXIN) tablet 0.25 mg  0.25 mg Oral Daily Mcarthur Rossetti, MD   0.25 mg at 03/14/16 1005  . diphenhydrAMINE (BENADRYL) 12.5 MG/5ML elixir 12.5-25 mg  12.5-25 mg Oral Q4H PRN Mcarthur Rossetti, MD      . docusate sodium (COLACE) capsule 100 mg  100 mg Oral BID Mcarthur Rossetti, MD   100 mg at 03/14/16 1005  . fluticasone (FLONASE) 50 MCG/ACT nasal spray 1 spray  1 spray Each Nare Daily PRN Mcarthur Rossetti, MD      . HYDROmorphone (DILAUDID) injection 0.5 mg  0.5 mg Intravenous Q2H PRN Mcarthur Rossetti, MD   0.5 mg at 03/11/16 1554  . hydroxyurea (HYDREA) capsule 500 mg  500 mg Oral Daily Mcarthur Rossetti, MD   500 mg at 03/14/16 1005  . ketotifen (ZADITOR) 0.025 % ophthalmic solution 2 drop  2 drop Both Eyes BID Mcarthur Rossetti, MD   2 drop at 03/12/16 2108  . losartan (COZAAR) tablet 50 mg  50 mg Oral q morning - 10a Mcarthur Rossetti, MD   50 mg at 03/14/16 1006  .  menthol-cetylpyridinium (CEPACOL) lozenge 3 mg  1 lozenge Oral PRN Mcarthur Rossetti, MD       Or  . phenol (CHLORASEPTIC) mouth spray 1 spray  1 spray Mouth/Throat PRN Mcarthur Rossetti, MD      . methocarbamol (ROBAXIN) tablet 500 mg  500 mg Oral Q6H PRN Mcarthur Rossetti, MD       Or  . methocarbamol (ROBAXIN) 500 mg in dextrose 5 % 50 mL IVPB  500 mg Intravenous Q6H PRN Mcarthur Rossetti, MD   500 mg at 03/11/16 1420  . metoCLOPramide (REGLAN) tablet 5-10 mg  5-10 mg Oral Q8H PRN Mcarthur Rossetti, MD       Or  . metoCLOPramide (REGLAN) injection 5-10 mg  5-10 mg Intravenous Q8H PRN Mcarthur Rossetti, MD      . ondansetron [facility]) tablet 4 mg  4 mg Oral Q6H PRN Mcarthur Rossetti, MD       Or  . ondansetron [facility]) injection 4 mg  4 mg Intravenous Q6H PRN Mcarthur Rossetti, MD      . oxyCODONE (Oxy IR/ROXICODONE) immediate release tablet 5-10 mg  5-10 mg Oral Q3H PRN Mcarthur Rossetti, MD   10 mg at 03/11/16 2137  . triamterene-hydrochlorothiazide (MAXZIDE-25) 37.5-25 MG per tablet 1 tablet  1 tablet Oral Daily Mcarthur Rossetti, MD   1 tablet at 03/14/16 1005     Discharge Medications: Please see discharge summary for a list of discharge medications.  Relevant Imaging Results:  Relevant Lab Results:   Additional Information SSN: [id number]  Marguita Venning, Hughes Better A, LCSW

## 2016-03-14 NOTE — Care Management Important Message (Signed)
Important Message  Patient Details  Name: ATLAS SHIHADEH MRN: [id number] Date of Birth: Dec 30, 1922   Medicare Important Message Given:  Yes    Camillo Flaming 03/14/2016, 11:04 AMImportant Message  Patient Details  Name: MICK ACHTER MRN: [id number] Date of Birth: 02-13-1923   Medicare Important Message Given:  Yes    Camillo Flaming 03/14/2016, 11:03 AM

## 2016-03-14 NOTE — Progress Notes (Signed)
Patient ID: Adam Tapia, male   DOB: 1922/12/23, 82 y.o.   MRN: [id number]  UA negative for UTI just a trace HgB most likely due to foley placement. Patient ready for discharge to SNF.

## 2016-03-14 NOTE — NC FL2 (Deleted)
[place] MEDICAID FL2 LEVEL OF CARE SCREENING TOOL     IDENTIFICATION  Patient Name: Adam Tapia Birthdate: 1923/11/17 Sex: male Admission Date (Current Location): 03/11/2016  [facility] and [place] Number:  Herbalist and Address:  [facility],  [place] [place], [place]      Provider Number: [id number]  Attending Physician Name and Address:  Mcarthur Rossetti, *  Relative Name and Phone Number:       Current Level of Care: Hospital Recommended Level of Care: [place] Prior Approval Number:    Date Approved/Denied:   PASRR Number:    Discharge Plan: SNF    Current Diagnoses: Patient Active Problem List   Diagnosis Date Noted  . Osteoarthritis of left hip 03/11/2016  . Status post total replacement of right hip 03/11/2016  . Myeloproliferative disorder ([place]) 08/27/2014  . Leukocytosis 06/03/2013    Orientation RESPIRATION BLADDER Height & Weight     Self, Time, Situation, Place  Normal Continent Weight: 171 lb (77.565 kg) Height:  5\' 8"  (172.7 cm)  BEHAVIORAL SYMPTOMS/MOOD NEUROLOGICAL BOWEL NUTRITION STATUS   (no behaviors)  (NONE) Continent Diet (Diet Regular)  AMBULATORY STATUS COMMUNICATION OF NEEDS Skin   Limited Assist Verbally Surgical wounds                       Personal Care Assistance Level of Assistance  Bathing, Feeding, Dressing Bathing Assistance: Limited assistance Feeding assistance: Independent Dressing Assistance: Limited assistance     Functional Limitations Info  Sight, Hearing, Speech Sight Info: Adequate Hearing Info: Adequate Speech Info: Adequate    SPECIAL CARE FACTORS FREQUENCY  PT (By licensed PT), OT (By licensed OT)     PT Frequency: 5 x a week OT Frequency: 5 x a week            Contractures Contractures Info: Not present    Additional Factors Info  Code Status, Allergies Code Status Info: FULL code status Allergies Info: No Known Allergies            Current Medications (03/14/2016):  This is the current hospital active medication list Current Facility-Administered Medications  Medication Dose Route Frequency Provider Last Rate Last Dose  . 0.9 %  sodium chloride infusion   Intravenous Once Cynda Familia, CRNA      . 0.9 %  sodium chloride infusion   Intravenous Continuous Mcarthur Rossetti, MD   Stopped at 03/12/16 1111  . acetaminophen (TYLENOL) tablet 650 mg  650 mg Oral Q6H PRN Mcarthur Rossetti, MD       Or  . acetaminophen (TYLENOL) suppository 650 mg  650 mg Rectal Q6H PRN Mcarthur Rossetti, MD      . alum & mag hydroxide-simeth (MAALOX/MYLANTA) 200-200-20 MG/5ML suspension 30 mL  30 mL Oral Q4H PRN Mcarthur Rossetti, MD   30 mL at 03/14/16 1010  . aspirin EC tablet 325 mg  325 mg Oral Q breakfast Mcarthur Rossetti, MD   325 mg at 03/14/16 0840  . atenolol (TENORMIN) tablet 25 mg  25 mg Oral Daily Mcarthur Rossetti, MD   25 mg at 03/14/16 1006  . digoxin (LANOXIN) tablet 0.25 mg  0.25 mg Oral Daily Mcarthur Rossetti, MD   0.25 mg at 03/14/16 1005  . diphenhydrAMINE (BENADRYL) 12.5 MG/5ML elixir 12.5-25 mg  12.5-25 mg Oral Q4H PRN Mcarthur Rossetti, MD      . docusate sodium (COLACE) capsule 100  mg  100 mg Oral BID Mcarthur Rossetti, MD   100 mg at 03/14/16 1005  . fluticasone (FLONASE) 50 MCG/ACT nasal spray 1 spray  1 spray Each Nare Daily PRN Mcarthur Rossetti, MD      . HYDROmorphone (DILAUDID) injection 0.5 mg  0.5 mg Intravenous Q2H PRN Mcarthur Rossetti, MD   0.5 mg at 03/11/16 1554  . hydroxyurea (HYDREA) capsule 500 mg  500 mg Oral Daily Mcarthur Rossetti, MD   500 mg at 03/14/16 1005  . ketotifen (ZADITOR) 0.025 % ophthalmic solution 2 drop  2 drop Both Eyes BID Mcarthur Rossetti, MD   2 drop at 03/12/16 2108  . losartan (COZAAR) tablet 50 mg  50 mg Oral q morning - 10a Mcarthur Rossetti, MD   50 mg at 03/14/16 1006  . menthol-cetylpyridinium  (CEPACOL) lozenge 3 mg  1 lozenge Oral PRN Mcarthur Rossetti, MD       Or  . phenol (CHLORASEPTIC) mouth spray 1 spray  1 spray Mouth/Throat PRN Mcarthur Rossetti, MD      . methocarbamol (ROBAXIN) tablet 500 mg  500 mg Oral Q6H PRN Mcarthur Rossetti, MD       Or  . methocarbamol (ROBAXIN) 500 mg in dextrose 5 % 50 mL IVPB  500 mg Intravenous Q6H PRN Mcarthur Rossetti, MD   500 mg at 03/11/16 1420  . metoCLOPramide (REGLAN) tablet 5-10 mg  5-10 mg Oral Q8H PRN Mcarthur Rossetti, MD       Or  . metoCLOPramide (REGLAN) injection 5-10 mg  5-10 mg Intravenous Q8H PRN Mcarthur Rossetti, MD      . ondansetron [facility]) tablet 4 mg  4 mg Oral Q6H PRN Mcarthur Rossetti, MD       Or  . ondansetron [facility]) injection 4 mg  4 mg Intravenous Q6H PRN Mcarthur Rossetti, MD      . oxyCODONE (Oxy IR/ROXICODONE) immediate release tablet 5-10 mg  5-10 mg Oral Q3H PRN Mcarthur Rossetti, MD   10 mg at 03/11/16 2137  . triamterene-hydrochlorothiazide (MAXZIDE-25) 37.5-25 MG per tablet 1 tablet  1 tablet Oral Daily Mcarthur Rossetti, MD   1 tablet at 03/14/16 1005     Discharge Medications: Please see discharge summary for a list of discharge medications.  Relevant Imaging Results:  Relevant Lab Results:   Additional Information SSN: [id number]  KIDD, Hughes Better A, LCSW

## 2016-03-14 NOTE — Clinical Social Work Note (Signed)
Clinical Social Work Assessment  Patient Details  Name: Adam Tapia MRN: [id number] Date of Birth: [birth year]  Date of referral:  03/14/16               Reason for consult:  Facility Placement                Permission sought to share information with:  Family Supports, Chartered certified accountant granted to share information::  Yes, Verbal Permission Granted  Name::     [place]::  [place]  Relationship::  Spouse  Contact Information:     Housing/Transportation Living arrangements for the past 2 months:  Single Family Home Source of Information:  Patient Patient Interpreter Needed:  None Criminal Activity/Legal Involvement Pertinent to Current Situation/Hospitalization:  No - Comment as needed Significant Relationships:  Spouse Lives with:  Spouse Do you feel safe going back to the place where you live?  Yes Need for family participation in patient care:  No (Coment)  Care giving concerns:  Pt admitted from home with spouse. PT recommending short-term rehab at a SNF.   Social Worker assessment / plan:  CSW received consult for SNF placement. Per RNCM pt prefers [facility]. Per MD, pt medically ready for dc today.   BSW Intern met with pt and pt spouse at bedside. Pt confirms he is from home with wife. Pt uses a cane at home. Pt and pt wife aware of short-term rehab recommendation. Pt agreeable to short-term rehab at a SNF. Pt and pt wife confirm first choice is [place] or [facility].   CSW completed FL2 and faxed pt information to [facility] via [employer] and submitted information for insurance auth.   CSW to await insurance auth and provide disposition needs when appropriate.  Employment status:  Retired Nurse, adult PT Recommendations:  [place] / Referral to community resources:  [place]  Patient/Family's Response to care:  Pt alert and  oriented x4. Pt agreeable to short-term rehab at SNF because he does not think his wife can care for him at home at this time. Pt eager to start rehab and return home.  Patient/Family's Understanding of and Emotional Response to Diagnosis, Current Treatment, and Prognosis:  Pt and pt wife report no further questions at this time.  Emotional Assessment Appearance:  Appears younger than stated age Attitude/Demeanor/Rapport:  Other (Appropriate) Affect (typically observed):  Accepting, Adaptable, Calm Orientation:  Oriented to Self, Oriented to Place, Oriented to  Time, Oriented to Situation Alcohol / Substance use:  Not Applicable Psych involvement (Current and /or in the community):  No (Comment)  Discharge Needs  Concerns to be addressed:  No discharge needs identified Readmission within the last 30 days:  No Current discharge risk:  None Barriers to Discharge:  Continued Medical Work up   [employer], Student-SW 03/14/2016, 12:09 PM

## 2016-03-14 NOTE — Clinical Social Work Placement (Signed)
   CLINICAL SOCIAL WORK PLACEMENT  NOTE  Date:  03/14/2016  Patient Details  Name: Adam Tapia MRN: [id number] Date of Birth: Apr 04, 1923  Clinical Social Work is seeking post-discharge placement for this patient at the [place] level of care (*CSW will initial, date and re-position this form in  chart as items are completed):  Yes   Patient/family provided with [place] Work Department's list of facilities offering this level of care within the geographic area requested by the patient (or if unable, by the patient's family).  Yes   Patient/family informed of their freedom to choose among providers that offer the needed level of care, that participate in Medicare, Medicaid or managed care program needed by the patient, have an available bed and are willing to accept the patient.  Yes   Patient/family informed of [place]'s ownership interest in [facility] and [facility], as well as of the fact that they are under no obligation to receive care at these facilities.  PASRR submitted to EDS on 03/13/16     PASRR number received on 03/13/16     Existing PASRR number confirmed on       FL2 transmitted to all facilities in geographic area requested by pt/family on 03/13/16     FL2 transmitted to all facilities within larger geographic area on       Patient informed that his/her managed care company has contracts with or will negotiate with certain facilities, including the following:        Yes   Patient/family informed of bed offers received.  Patient chooses bed at [facility]     Physician recommends and patient chooses bed at      Patient to be transferred to [facility] on  .  Patient to be transferred to facility by       Patient family notified on   of transfer.  Name of family member notified:        PHYSICIAN Please sign FL2     Additional Comment:    _______________________________________________ Ladell Pier, LCSW 03/14/2016, 12:06 PM

## 2016-03-14 NOTE — Progress Notes (Signed)
Pt for discharge to [facility] and Rehab.  CSW facilitated pt discharge needs including contacting facility, faxing pt discharge information via epic hub, discussing with pt at bedside and pt wife via telephone, providing RN phone number to call report, received insurance auth through [employer] [phone] #: [id number]) and providing discharge packet at bedside for pt to provide to [facility] upon arrival. Pt wife plans to transport via private vehicle.  No further social work needs identified at this time.  CSW signing off.   Alison Murray, MSW, [place] Work [phone]

## 2016-03-14 NOTE — Clinical Social Work Placement (Signed)
   CLINICAL SOCIAL WORK PLACEMENT  NOTE  Date:  03/14/2016  Patient Details  Name: Adam Tapia MRN: [id number] Date of Birth: January 25, 1923  Clinical Social Work is seeking post-discharge placement for this patient at the [place] level of care (*CSW will initial, date and re-position this form in  chart as items are completed):  Yes   Patient/family provided with [place] Work Department's list of facilities offering this level of care within the geographic area requested by the patient (or if unable, by the patient's family).  Yes   Patient/family informed of their freedom to choose among providers that offer the needed level of care, that participate in Medicare, Medicaid or managed care program needed by the patient, have an available bed and are willing to accept the patient.  Yes   Patient/family informed of [place]'s ownership interest in [facility] and [facility], as well as of the fact that they are under no obligation to receive care at these facilities.  PASRR submitted to EDS on 03/13/16     PASRR number received on 03/13/16     Existing PASRR number confirmed on       FL2 transmitted to all facilities in geographic area requested by pt/family on 03/13/16     FL2 transmitted to all facilities within larger geographic area on       Patient informed that his/her managed care company has contracts with or will negotiate with certain facilities, including the following:        Yes   Patient/family informed of bed offers received.  Patient chooses bed at [facility]     Physician recommends and patient chooses bed at      Patient to be transferred to [facility] on 03/14/16.  Patient to be transferred to facility by pt wife via private vehicle     Patient family notified on 03/14/16 of transfer.  Name of family member notified:  pt and pt wife notified     PHYSICIAN Please sign FL2     Additional Comment:     _______________________________________________ Ladell Pier, LCSW 03/14/2016, 4:15 PM

## 2016-03-14 NOTE — Discharge Instructions (Signed)

## 2016-03-14 NOTE — Progress Notes (Signed)
Subjective: 3 Days Post-Op Procedure(s) (LRB): LEFT TOTAL HIP ARTHROPLASTY ANTERIOR APPROACH (Left) Patient reports pain as mild.  Complaining of dysuria but making urine.  Objective: Vital signs in last 24 hours: Temp:  [97.6 F (36.4 C)-98.8 F (37.1 C)] 98 F (36.7 C) (04/24 0415) Pulse Rate:  [68-74] 72 (04/24 0415) Resp:  [16-18] 18 (04/24 0415) BP: (120-156)/(50-76) 156/76 mmHg (04/24 0415) SpO2:  [98 %-100 %] 98 % (04/24 0415)  Intake/Output from previous day: 04/23 0701 - 04/24 0700 In: 900 [P.O.:900] Out: -  Intake/Output this shift:     Recent Labs  03/11/16 1252 03/12/16 0412 03/13/16 0423 03/14/16 0421  HGB 8.5* 8.1* 9.4* 9.9*    Recent Labs  03/13/16 0423 03/14/16 0421  WBC 32.3* 32.5*  RBC 2.85* 2.99*  HCT 27.0* 28.7*  PLT 311 363    Recent Labs  03/11/16 1252 03/12/16 0412  NA 137 135  K 4.5 4.5  CL  --  104  CO2  --  25  BUN  --  20  CREATININE  --  0.94  GLUCOSE 92 113*  CALCIUM  --  9.1   No results for input(s): LABPT, INR in the last 72 hours. Left Lower extremity: Sensation intact distally Dorsiflexion/Plantar flexion intact Incision: dressing C/D/I Compartment soft  Assessment/Plan: 3 Days Post-Op Procedure(s) (LRB): LEFT TOTAL HIP ARTHROPLASTY ANTERIOR APPROACH (Left) Up with therapy  Check UA due to dysuria  Discharge to SNF when bed available  CLARK, Allisonia 03/14/2016, 8:06 AM

## 2016-03-14 NOTE — Discharge Summary (Signed)
Patient ID: Adam Tapia MRN: [id number] DOB/AGE: [birth year] 82 y.o.  Admit date: 03/11/2016 Discharge date: 03/14/2016  Admission Diagnoses:  Principal Problem:   Osteoarthritis of left hip Active Problems:   Status post total replacement of right hip   Discharge Diagnoses:  Same  Past Medical History  Diagnosis Date  . Hypertension   . Dysrhythmia     PT STATES HAS HX A FIB - FOLLOWED BY DR.KEVIN LITTLE  . Arthritis   . Psoriasis   . GERD (gastroesophageal reflux disease)     OCCASIONAL - HAS NEXIUM IF NEEDED - NOT CURRENTLY TAKING  . BPH (benign prostatic hyperplasia)   . Nocturia   . Anemia   . Myeloproliferative disorder ([place])     [facility]    Surgeries: Procedure(s): LEFT TOTAL HIP ARTHROPLASTY ANTERIOR APPROACH on 03/11/2016   Consultants:  PT  Discharged Condition: Improved  Hospital Course: Adam Tapia is an 82 y.o. male who was admitted 03/11/2016 for operative treatment ofOsteoarthritis of left hip. Patient has severe unremitting pain that affects sleep, daily activities, and work/hobbies. After pre-op clearance the patient was taken to the operating room on 03/11/2016 and underwent  Procedure(s): LEFT TOTAL HIP ARTHROPLASTY ANTERIOR APPROACH.    Patient was given perioperative antibiotics: Anti-infectives    Start     Dose/Rate Route Frequency Ordered Stop   03/11/16 1800  ceFAZolin (ANCEF) IVPB 1 g/50 mL premix     1 g 100 mL/hr over 30 Minutes Intravenous Every 6 hours 03/11/16 1512 03/11/16 2207   03/11/16 0944  ceFAZolin (ANCEF) IVPB 2g/100 mL premix     2 g 200 mL/hr over 30 Minutes Intravenous On call to O.R. 03/11/16 0944 03/11/16 1124       Patient was given sequential compression devices, early ambulation, and chemoprophylaxis to prevent DVT.  Patient benefited maximally from hospital stay and there were no complications.    Recent vital signs: Patient Vitals for the past 24 hrs:  BP Temp Temp src  Pulse Resp SpO2  03/14/16 1000 115/64 mmHg - - 80 - 100 %  03/14/16 0415 (!) 156/76 mmHg 98 F (36.7 C) Oral 72 18 98 %  03/13/16 1946 (!) 120/50 mmHg 98.8 F (37.1 C) Oral 68 17 100 %  03/13/16 1300 (!) 128/56 mmHg 97.6 F (36.4 C) Oral 70 16 100 %     Recent laboratory studies:  Recent Labs  03/11/16 1252  03/12/16 0412 03/13/16 0423 03/14/16 0421  WBC  --   < > 25.4* 32.3* 32.5*  HGB 8.5*  --  8.1* 9.4* 9.9*  HCT 25.0*  --  23.6* 27.0* 28.7*  PLT  --   < > 319 311 363  NA 137  --  135  --   --   K 4.5  --  4.5  --   --   CL  --   --  104  --   --   CO2  --   --  25  --   --   BUN  --   --  20  --   --   CREATININE  --   --  0.94  --   --   GLUCOSE 92  --  113*  --   --   CALCIUM  --   --  9.1  --   --   < > = values in this interval not displayed.   Discharge Medications:  Medication List    TAKE these medications        acetaminophen 325 MG tablet  Commonly known as:  TYLENOL  Take 650 mg by mouth every 6 (six) hours as needed for moderate pain.     amLODipine 5 MG tablet  Commonly known as:  NORVASC  Take 5 mg by mouth 2 (two) times daily.     aspirin 325 MG EC tablet  Take 1 tablet (325 mg total) by mouth daily with breakfast.     atenolol 25 MG tablet  Commonly known as:  TENORMIN  Take 25 mg by mouth daily.     digoxin 0.25 MG tablet  Commonly known as:  LANOXIN  Take 0.25 mg by mouth daily.     fexofenadine 180 MG tablet  Commonly known as:  ALLEGRA  Take 180 mg by mouth daily.     fluticasone 50 MCG/ACT nasal spray  Commonly known as:  FLONASE  Place 1 spray into both nostrils daily as needed for allergies.     hydroxyurea 500 MG capsule  Commonly known as:  HYDREA  TAKE 1 CAPSULE (500 MG TOTAL) BY MOUTH DAILY. MAY TAKE WITH FOOD TO MINIMIZE GI SIDE EFFECTS.     ketotifen 0.025 % ophthalmic solution  Commonly known as:  ZADITOR  1 drop 2 (two) times daily.     losartan 50 MG tablet  Commonly known as:  COZAAR  Take 50 mg by mouth  every morning.     oxyCODONE 5 MG immediate release tablet  Commonly known as:  Oxy IR/ROXICODONE  Take 1-2 tablets (5-10 mg total) by mouth every 3 (three) hours as needed for breakthrough pain.     triamterene-hydrochlorothiazide 37.5-25 MG tablet  Commonly known as:  MAXZIDE-25  Take 1 tablet by mouth daily. Pt states he takes 1/2 tablet daily     TURMERIC PO  Take 1 tablet by mouth daily.        Diagnostic Studies: Dg C-arm 1-60 Min-no Report  03/11/2016  CLINICAL DATA: surgery C-ARM 1-60 MINUTES Fluoroscopy was utilized by the requesting physician.  No radiographic interpretation.   Dg Hip Port Unilat With Pelvis 1v Left  03/11/2016  CLINICAL DATA:  82 year old male with a history of left hip replacement. EXAM: DG HIP (WITH OR WITHOUT PELVIS) 1V PORT LEFT COMPARISON:  03/11/2016 FINDINGS: Early postoperative changes of left total hip arthroplasty. Surgical clips project over the soft tissues of the left hip. Gas within the surgical bed. Urinary catheter in place. No complicating features/perihardware fracture. IMPRESSION: Early postoperative changes of left hip arthroplasty, with no complicating features. Signed, Dulcy Fanny. Earleen Newport, DO [facility] Electronically Signed   By: Corrie Mckusick D.O.   On: 03/11/2016 13:42   Dg Hip Operative Unilat With Pelvis Left  03/11/2016  CLINICAL DATA:  LEFT total hip replacement EXAM: OPERATIVE LEFT HIP (WITH PELVIS IF PERFORMED) 4 VIEWS TECHNIQUE: Fluoroscopic spot image(s) were submitted for interpretation post-operatively. COMPARISON:  None FLUOROSCOPY TIME:  0 minutes 24 seconds Images obtained:  4 FINDINGS: Initial image demonstrates advanced osteoarthritic changes of the LEFT hip joint with joint space narrowing and spur formation. Bone on bones appearance superiorly with subchondral sclerosis. Subsequent images demonstrate placement of a LEFT hip prosthesis without fracture or dislocation.  Visualized pelvis intact. Bones demineralized. IMPRESSION: Osteoarthritic changes of the LEFT with subsequent placement of a LEFT hip prosthesis without acute complication. Electronically Signed   By: Lavonia Dana M.D.   On: 03/11/2016  12:59    Disposition: [place]        Follow-up Information    Follow up with Mcarthur Rossetti, MD. Schedule an appointment as soon as possible for a visit in 2 weeks.   Specialty:  Orthopedic Surgery   Contact information:   [place] [address] [phone]        Signed: KELLEY, LEAMING 03/14/2016, 12:26 PM

## 2016-03-14 NOTE — Progress Notes (Signed)
Physical Therapy Treatment Patient Details Name: Adam Tapia MRN: [id number] DOB: August 03, 1923 Today's Date: 03/14/2016    History of Present Illness L THR - ant dir    PT Comments    POD # 1 Assisted OOB to amb to bathrom and back then performed THr TE's followed by ICE.  Follow Up Recommendations  SNF     Equipment Recommendations       Recommendations for Other Services       Precautions / Restrictions Precautions Precautions: Fall Restrictions Weight Bearing Restrictions: No Other Position/Activity Restrictions: WBAT    Mobility  Bed Mobility Overal bed mobility: Needs Assistance;+2 for physical assistance Bed Mobility: Supine to Sit     Supine to sit: Min assist     General bed mobility comments: assist L LE and increased time  Transfers Overall transfer level: Needs assistance Equipment used: Rolling walker (2 wheeled) Transfers: Sit to/from Stand Sit to Stand: Min assist;Min guard         General transfer comment: 50% VC's on proper hand placement and safety with turns.  Assisted in bethroom as well.  Ambulation/Gait Ambulation/Gait assistance: Min guard;Min assist Ambulation Distance (Feet): 20 Feet (10 feet to and from bathrrom)         General Gait Details: cues for sequence, posture and position from [employer]            Wheelchair Mobility    Modified Rankin (Stroke Patients Only)       Balance                                    Cognition Arousal/Alertness: Awake/alert Behavior During Therapy: WFL for tasks assessed/performed Overall Cognitive Status: Within Functional Limits for tasks assessed                      Exercises   Total Hip Replacement TE's 10 reps ankle pumps 10 reps knee presses 10 reps heel slides 10 reps SAQ's 10 reps ABD Followed by ICE     General Comments        Pertinent Vitals/Pain      Home Living                      Prior Function           PT Goals (current goals can now be found in the care plan section) Progress towards PT goals: Progressing toward goals    Frequency  7X/week    PT Plan Current plan remains appropriate    Co-evaluation             End of Session Equipment Utilized During Treatment: Gait belt Activity Tolerance: Patient tolerated treatment well Patient left: in chair;with call bell/phone within reach;with chair alarm set     Time: 1040-1110 PT Time Calculation (min) (ACUTE ONLY): 30 min  Charges:  $Gait Training: 8-22 mins $Therapeutic Activity: 8-22 mins                    G Codes:      Adam Tapia  PTA [facility]  Acute  Rehab Pager      [phone]

## 2016-03-14 NOTE — Progress Notes (Signed)
Nurse called report to [facility] at [place]. Shontae has no questions at this time. Thresa Ross has nurses telephone number if any questions arise.

## 2016-03-15 ENCOUNTER — Non-Acute Institutional Stay: Payer: Medicare Other | Admitting: Internal Medicine

## 2016-03-15 ENCOUNTER — Encounter: Payer: Self-pay | Admitting: Internal Medicine

## 2016-03-15 DIAGNOSIS — I4891 Unspecified atrial fibrillation: Secondary | ICD-10-CM | POA: Diagnosis not present

## 2016-03-15 DIAGNOSIS — D471 Chronic myeloproliferative disease: Secondary | ICD-10-CM | POA: Diagnosis not present

## 2016-03-15 DIAGNOSIS — R2681 Unsteadiness on feet: Secondary | ICD-10-CM

## 2016-03-15 DIAGNOSIS — K219 Gastro-esophageal reflux disease without esophagitis: Secondary | ICD-10-CM

## 2016-03-15 DIAGNOSIS — I10 Essential (primary) hypertension: Secondary | ICD-10-CM

## 2016-03-15 DIAGNOSIS — D72829 Elevated white blood cell count, unspecified: Secondary | ICD-10-CM

## 2016-03-15 DIAGNOSIS — K5900 Constipation, unspecified: Secondary | ICD-10-CM | POA: Diagnosis not present

## 2016-03-15 DIAGNOSIS — D62 Acute posthemorrhagic anemia: Secondary | ICD-10-CM | POA: Diagnosis not present

## 2016-03-15 DIAGNOSIS — J309 Allergic rhinitis, unspecified: Secondary | ICD-10-CM

## 2016-03-15 DIAGNOSIS — M1612 Unilateral primary osteoarthritis, left hip: Secondary | ICD-10-CM

## 2016-03-15 NOTE — Progress Notes (Signed)
LOCATION: [facility]  PCP: Gennette Pac, MD   Code Status: Full Code  Goals of care: Advanced Directive information Advanced Directives 03/11/2016  Does patient have an advance directive? No  Would patient like information on creating an advanced directive? Yes Higher education careers adviser given       Extended Emergency Contact Information Primary Emergency Contact: [facility] Address: [address]          [place], [place] [address] Johnnette Litter of [place] Phone: [phone] Mobile Phone: [phone] Relation: Spouse   No Known Allergies  Chief Complaint  Patient presents with  . New Admit To SNF    New Admission     HPI:  Patient is a 82 y.o. male seen today for short term rehabilitation post hospital admission from 03/11/16-03/14/16 with left hip OA. He underwent left total hip arthroplasty. He is seen in his room with his friend present. His pain medication has been helpful. He complaints of heartburn and would like his home regimen nexium resumed. He has PMH of HTN, myeloproliferative disorder, BPH, GERD among others.   Review of Systems:  Constitutional: Negative for fever, chills, diaphoresis. Energy level slowly coming back.  HENT: Negative for headache, congestion,hearing loss, sore throat, difficulty swallowing. Positive for occasional runny nose.  Eyes: Negative for blurred vision, double vision and discharge. Wears glasses.  Respiratory: Negative for cough, shortness of breath and wheezing.   Cardiovascular: Negative for chest pain, palpitations, leg swelling.  Gastrointestinal: Negative for heartburn, nausea, vomiting, abdominal pain, loss of appetite, melena, diarrhea and constipation. Last bowel movement was today.  Genitourinary: Negative for dysuria and flank pain.  Musculoskeletal: Negative for back pain, fall in the facility.  Skin: Negative for itching, rash.  Neurological: Negative for weakness. Positive for dizziness with change  of position. Psychiatric/Behavioral: Negative for depression   Past Medical History  Diagnosis Date  . Hypertension   . Dysrhythmia     PT STATES HAS HX A FIB - FOLLOWED BY DR.KEVIN LITTLE  . Arthritis   . Psoriasis   . GERD (gastroesophageal reflux disease)     OCCASIONAL - HAS NEXIUM IF NEEDED - NOT CURRENTLY TAKING  . BPH (benign prostatic hyperplasia)   . Nocturia   . Anemia   . Myeloproliferative disorder ([place])     FOLLOWED BY DR. Julien Nordmann  AT [facility]   Past Surgical History  Procedure Laterality Date  . Hernia repair  1998  . Total hip arthroplasty Left 03/11/2016    Procedure: LEFT TOTAL HIP ARTHROPLASTY ANTERIOR APPROACH;  Surgeon: Mcarthur Rossetti, MD;  Location: [facility];  Service: Orthopedics;  Laterality: Left;  Went from Spinal to an LMA   Social History:   reports that he has never smoked. He does not have any smokeless tobacco history on file. He reports that he does not drink alcohol or use illicit drugs.  No family history on file.  Medications:   Medication List       This list is accurate as of: 03/15/16 11:25 AM.  Always use your most recent med list.               acetaminophen 325 MG tablet  Commonly known as:  TYLENOL  Take 650 mg by mouth every 6 (six) hours as needed for moderate pain.     amLODipine 5 MG tablet  Commonly known as:  NORVASC  Take 5 mg by mouth 2 (two) times daily.     aspirin 325 MG EC tablet  Take 1 tablet (325 mg total) by mouth daily with breakfast.     atenolol 25 MG tablet  Commonly known as:  TENORMIN  Take 25 mg by mouth daily.     digoxin 0.25 MG tablet  Commonly known as:  LANOXIN  Take 0.25 mg by mouth daily.     fexofenadine 180 MG tablet  Commonly known as:  ALLEGRA  Take 180 mg by mouth daily.     fluticasone 50 MCG/ACT nasal spray  Commonly known as:  FLONASE  Place 1 spray into both nostrils daily as needed for allergies.     hydroxyurea 500 MG capsule  Commonly known as:  HYDREA    TAKE 1 CAPSULE (500 MG TOTAL) BY MOUTH DAILY. MAY TAKE WITH FOOD TO MINIMIZE GI SIDE EFFECTS.     ketotifen 0.025 % ophthalmic solution  Commonly known as:  ZADITOR  1 drop 2 (two) times daily.     losartan 50 MG tablet  Commonly known as:  COZAAR  Take 50 mg by mouth every morning.     oxyCODONE 5 MG immediate release tablet  Commonly known as:  Oxy IR/ROXICODONE  Take 1-2 tablets (5-10 mg total) by mouth every 3 (three) hours as needed for breakthrough pain.     triamterene-hydrochlorothiazide 37.5-25 MG tablet  Commonly known as:  MAXZIDE-25  Take 1 tablet by mouth daily. Pt states he takes 1/2 tablet daily     TURMERIC PO  Take 1 tablet by mouth daily.        Immunizations: Immunization History  Administered Date(s) Administered  . PPD Test 03/14/2016     Physical Exam: Filed Vitals:   03/15/16 1103  BP: 145/75  Pulse: 69  Temp: 97.6 F (36.4 C)  TempSrc: Oral  Resp: 18  Height: 5\' 8"  (1.727 m)  Weight: 171 lb (77.565 kg)  SpO2: 99%   Body mass index is 26.01 kg/(m^2).  General- elderly male, well built, in no acute distress Head- normocephalic, atraumatic Nose- no maxillary or frontal sinus tenderness, no nasal discharge Throat- moist mucus membrane  Eyes- PERRLA, EOMI, no pallor, no icterus, no discharge, normal conjunctiva, normal sclera Neck- no cervical lymphadenopathy Cardiovascular- normal s1,s2, no murmur, trace leg edema Respiratory- bilateral clear to auscultation, no wheeze, no rhonchi, no crackles, no use of accessory muscles Abdomen- bowel sounds present, soft, non tender Musculoskeletal- able to move all 4 extremities, limited left hip range of motion Neurological- alert and oriented to person, place and time Skin- warm and dry, left hip surgical incision with aquacel dressing in place Psychiatry- normal mood and affect    Labs reviewed: Basic Metabolic Panel:  Recent Labs  02/02/16 0844  03/01/16 0955 03/11/16 1252 03/12/16  03/12/16 0412  NA 137  < > 140 137 135* 135  K 4.0  --  4.3 4.5  --  4.5  CL  --   --  108  --   --  104  CO2 26  --  26  --   --  25  GLUCOSE 101  --  87 92  --  113*  BUN 19.6  --  18  --  20 20  CREATININE 1.2  --  1.15  --  0.9 0.94  CALCIUM 10.0  --  9.9  --   --  9.1  < > = values in this interval not displayed. Liver Function Tests:  Recent Labs  07/30/15 0814 10/29/15 0830 02/02/16 0844  AST 17 16 22   ALT 12  10 10  ALKPHOS 104 106 91  BILITOT 0.62 0.39 0.36  PROT 8.0 8.1 9.4*  ALBUMIN 3.6 3.6 2.8*   No results for input(s): LIPASE, AMYLASE in the last 8760 hours. No results for input(s): AMMONIA in the last 8760 hours. CBC:  Recent Labs  10/29/15 0830 02/02/16 0844  03/12/16 0412 03/13/16 0423 03/14/16 03/14/16 0421  WBC 20.4* 24.0*  < > 25.4* 32.3* 32.5 32.5*  NEUTROABS 15.8* 19.5*  --  20.9*  --   --   --   HGB 14.2 11.5*  < > 8.1* 9.4*  --  9.9*  HCT 42.3 34.4*  < > 23.6* 27.0*  --  28.7*  MCV 107.9* 106.3*  < > 97.1 94.7  --  96.0  PLT 425* 516*  < > 319 311  --  363  < > = values in this interval not displayed.   Radiological Exams: Dg C-arm 1-60 Min-no Report  03/11/2016  CLINICAL DATA: surgery C-ARM 1-60 MINUTES Fluoroscopy was utilized by the requesting physician.  No radiographic interpretation.   Dg Hip Port Unilat With Pelvis 1v Left  03/11/2016  CLINICAL DATA:  82 year old male with a history of left hip replacement. EXAM: DG HIP (WITH OR WITHOUT PELVIS) 1V PORT LEFT COMPARISON:  03/11/2016 FINDINGS: Early postoperative changes of left total hip arthroplasty. Surgical clips project over the soft tissues of the left hip. Gas within the surgical bed. Urinary catheter in place. No complicating features/perihardware fracture. IMPRESSION: Early postoperative changes of left hip arthroplasty, with no complicating features. Signed, Dulcy Fanny. Earleen Newport, DO [facility] Electronically Signed   By: Corrie Mckusick D.O.   On: 03/11/2016 13:42   Dg Hip Operative Unilat With Pelvis Left  03/11/2016  CLINICAL DATA:  LEFT total hip replacement EXAM: OPERATIVE LEFT HIP (WITH PELVIS IF PERFORMED) 4 VIEWS TECHNIQUE: Fluoroscopic spot image(s) were submitted for interpretation post-operatively. COMPARISON:  None FLUOROSCOPY TIME:  0 minutes 24 seconds Images obtained:  4 FINDINGS: Initial image demonstrates advanced osteoarthritic changes of the LEFT hip joint with joint space narrowing and spur formation. Bone on bones appearance superiorly with subchondral sclerosis. Subsequent images demonstrate placement of a LEFT hip prosthesis without fracture or dislocation. Visualized pelvis intact. Bones demineralized. IMPRESSION: Osteoarthritic changes of the LEFT with subsequent placement of a LEFT hip prosthesis without acute complication. Electronically Signed   By: Lavonia Dana M.D.   On: 03/11/2016 12:59    Assessment/Plan  Unsteady gait Will have patient work with PT/OT as tolerated to regain strength and restore function.  Fall precautions are in place.  Left hip OA S/p left total hip arthroplasty. Will have him work with physical therapy and occupational therapy team to help with gait training and muscle strengthening exercises.fall precautions. Skin care. Encourage to be out of bed. Has follow up with orthopedics. Continue aspirin 325 mg daily for dvt prophylaxis. Continue oxyIR 5 mg 1-2 tab q3h prn pain  Leukocytosis Monitor clinically as he is afebrile. He has history of myeloproliferative disorder, monitor clinically  Blood loss anemia Post op, monitor cbc  gerd Start nexium 10 mg bid home regimen and monitor  HTN Stable BP, monitor bp readings, continue tenormin 25 mg daily, cozaar 50 mg daily, maxzide 37.5- 25 half a tablet daily and norvasc 5 mg bid. Check bmp  afib Rate controlled. Continue digoxin and atenolol.   Allergic rhinitis Stable, continue flonase and allegra  Myeloproliferative  disorder Continue hydroxurea  Constipation prophylaxis Add colace 100  mg bid and miralax 17 g daily as needed and monitor   Goals of care: short term rehabilitation   Labs/tests ordered: cbc, bmp 03/17/16  Family/ staff Communication: reviewed care plan with patient and nursing supervisor    Blanchie Serve, MD Internal Medicine [place], [place] [address] Cell Phone (Monday-Friday 8 am - 5 pm): [phone] On Call: [phone] and follow prompts after 5 pm and on weekends Office Phone: [phone] Office Fax: [phone]

## 2016-03-16 ENCOUNTER — Encounter (HOSPITAL_COMMUNITY): Payer: Self-pay | Admitting: Orthopaedic Surgery

## 2016-03-17 LAB — CBC AND DIFFERENTIAL
HEMATOCRIT: 30 % — AB (ref 41–53)
HEMOGLOBIN: 9.8 g/dL — AB (ref 13.5–17.5)
Neutrophils Absolute: 26 /uL
PLATELETS: 474 10*3/uL — AB (ref 150–399)
WBC: 32.2 10*3/mL

## 2016-03-17 LAB — BASIC METABOLIC PANEL
BUN: 35 mg/dL — AB (ref 4–21)
CREATININE: 1.3 mg/dL (ref 0.6–1.3)
Glucose: 88 mg/dL
Potassium: 3.9 mmol/L (ref 3.4–5.3)
Sodium: 132 mmol/L — AB (ref 137–147)

## 2016-03-17 LAB — HEPATIC FUNCTION PANEL
ALK PHOS: 82 U/L (ref 25–125)
ALT: 8 U/L — AB (ref 10–40)
AST: 14 U/L (ref 14–40)
Bilirubin, Total: 0.5 mg/dL

## 2016-03-25 ENCOUNTER — Non-Acute Institutional Stay: Payer: Medicare Other | Admitting: Adult Health

## 2016-03-25 ENCOUNTER — Encounter: Payer: Self-pay | Admitting: Adult Health

## 2016-03-25 DIAGNOSIS — D72829 Elevated white blood cell count, unspecified: Secondary | ICD-10-CM | POA: Diagnosis not present

## 2016-03-25 DIAGNOSIS — J309 Allergic rhinitis, unspecified: Secondary | ICD-10-CM

## 2016-03-25 DIAGNOSIS — D471 Chronic myeloproliferative disease: Secondary | ICD-10-CM | POA: Diagnosis not present

## 2016-03-25 DIAGNOSIS — K5900 Constipation, unspecified: Secondary | ICD-10-CM

## 2016-03-25 DIAGNOSIS — I4891 Unspecified atrial fibrillation: Secondary | ICD-10-CM | POA: Diagnosis not present

## 2016-03-25 DIAGNOSIS — M1612 Unilateral primary osteoarthritis, left hip: Secondary | ICD-10-CM | POA: Diagnosis not present

## 2016-03-25 DIAGNOSIS — K219 Gastro-esophageal reflux disease without esophagitis: Secondary | ICD-10-CM

## 2016-03-25 DIAGNOSIS — I10 Essential (primary) hypertension: Secondary | ICD-10-CM

## 2016-03-25 DIAGNOSIS — D62 Acute posthemorrhagic anemia: Secondary | ICD-10-CM

## 2016-03-25 NOTE — Progress Notes (Signed)
Patient ID: Adam Tapia, male   DOB: 02/25/1923, 82 y.o.   MRN: [id number]    DATE:  03/25/2016   MRN:  [id number]  BIRTHDAY: 10/24/1923  Facility:  Nursing Home Location:  [place] Room Number: [id number]  LEVEL OF CARE:  SNF [id number])  Contact Information    Name [place] Spouse [phone]  [phone]       Code Status History    This patient does not have a recorded code status. Please follow your organizational policy for patients in this situation.       Chief Complaint  Patient presents with  . Discharge Note    HISTORY OF PRESENT ILLNESS:  This is a 82 year old male who is for discharge home with Home health PT, OT, CNA and Nursing. DME:  Bedside commode and rolling walker. He has been admitted to [facility] on 03/14/16 from [facility]. He has PMH of hypertension, myeloproliferative disorder, BPH and GERD. He has left hip osteoarthritis for which he had left total hip arthroplasty.  Patient was admitted to this facility for short-term rehabilitation after the patient's recent hospitalization.  Patient has completed SNF rehabilitation and therapy has cleared the patient for discharge.   PAST MEDICAL HISTORY:  Past Medical History  Diagnosis Date  . Hypertension   . Dysrhythmia     PT STATES HAS HX A FIB - FOLLOWED BY DR.KEVIN LITTLE  . Arthritis   . Psoriasis   . GERD (gastroesophageal reflux disease)     OCCASIONAL - HAS NEXIUM IF NEEDED - NOT CURRENTLY TAKING  . BPH (benign prostatic hyperplasia)   . Nocturia   . Anemia   . Myeloproliferative disorder ([place])     FOLLOWED BY DR. Julien Nordmann  AT [place]  . Unsteady gait   . Primary osteoarthritis of left hip   . Acute blood loss anemia   . Constipation   . Allergic rhinitis   . AF (atrial fibrillation) (HCC)      CURRENT MEDICATIONS: Reviewed  Patient's Medications  New Prescriptions   No medications on file  Previous  Medications   ACETAMINOPHEN (TYLENOL) 325 MG TABLET    Take 650 mg by mouth every 6 (six) hours as needed for moderate pain.    AMLODIPINE (NORVASC) 5 MG TABLET    Take 5 mg by mouth 2 (two) times daily.    ASPIRIN EC 325 MG EC TABLET    Take 1 tablet (325 mg total) by mouth daily with breakfast.   ATENOLOL (TENORMIN) 25 MG TABLET    Take 25 mg by mouth daily.   DIGOXIN (LANOXIN) 0.25 MG TABLET    Take 0.25 mg by mouth daily.   DOCUSATE SODIUM (COLACE) 100 MG CAPSULE    Take 100 mg by mouth 2 (two) times daily.   ESOMEPRAZOLE (NEXIUM) 10 MG PACKET    Take 10 mg by mouth 2 (two) times daily.   FEXOFENADINE (ALLEGRA) 180 MG TABLET    Take 180 mg by mouth daily.   FLUTICASONE (FLONASE) 50 MCG/ACT NASAL SPRAY    Place 1 spray into both nostrils daily as needed for allergies.    HYDROXYUREA (HYDREA) 500 MG CAPSULE    TAKE 1 CAPSULE (500 MG TOTAL) BY MOUTH DAILY. MAY TAKE WITH FOOD TO MINIMIZE GI SIDE EFFECTS.   KETOTIFEN (ZADITOR) 0.025 % OPHTHALMIC SOLUTION    1 drop 2 (two) times daily.   LOSARTAN (COZAAR) 50 MG TABLET  Take 50 mg by mouth every morning.   OXYCODONE (OXY IR/ROXICODONE) 5 MG IMMEDIATE RELEASE TABLET    Take 1-2 tablets (5-10 mg total) by mouth every 3 (three) hours as needed for breakthrough pain.   POLYETHYLENE GLYCOL (MIRALAX / GLYCOLAX) PACKET    Take 17 g by mouth daily as needed for mild constipation.   PROTEIN (PROCEL 100) POWD    Take 2 scoop by mouth 2 (two) times daily.   TRIAMTERENE-HYDROCHLOROTHIAZIDE (MAXZIDE-25) 37.5-25 MG PER TABLET    Take 0.5 tablets by mouth daily.    TURMERIC PO    Take 1 tablet by mouth daily.  Modified Medications   No medications on file  Discontinued Medications   No medications on file     No Known Allergies   REVIEW OF SYSTEMS:  GENERAL: no change in appetite, no fatigue, no weight changes, no fever, chills or weakness EYES: Denies change in vision, dry eyes, eye pain, itching or discharge EARS: Denies change in hearing, ringing  in ears, or earache NOSE: Denies nasal congestion or epistaxis MOUTH and THROAT: Denies oral discomfort, gingival pain or bleeding, pain from teeth or hoarseness   RESPIRATORY: no cough, SOB, DOE, wheezing, hemoptysis CARDIAC: no chest pain, edema or palpitations GI: no abdominal pain, diarrhea, constipation, heart burn, nausea or vomiting GU: Denies dysuria, frequency, hematuria, incontinence, or discharge PSYCHIATRIC: Denies feeling of depression or anxiety. No report of hallucinations, insomnia, paranoia, or agitation   PHYSICAL EXAMINATION  GENERAL APPEARANCE: Well nourished. In no acute distress. Normal body habitus SKIN:  Left hip surgical incision is covered with Aquacel dressing, dry, no erythema HEAD: Normal in size and contour. No evidence of trauma EYES: Lids open and close normally. No blepharitis, entropion or ectropion. PERRL. Conjunctivae are clear and sclerae are white. Lenses are without opacity EARS: Pinnae are normal. Patient hears normal voice tunes of the examiner MOUTH and THROAT: Lips are without lesions. Oral mucosa is moist and without lesions. Tongue is normal in shape, size, and color and without lesions NECK: supple, trachea midline, no neck masses, no thyroid tenderness, no thyromegaly LYMPHATICS: no LAN in the neck, no supraclavicular LAN RESPIRATORY: breathing is even & unlabored, BS CTAB CARDIAC: RRR, no murmur,no extra heart sounds, BLE edema 1+ GI: abdomen soft, normal BS, no masses, no tenderness, no hepatomegaly, no splenomegaly EXTREMITIES:  Able to move X 4 extremities PSYCHIATRIC: Alert and oriented X 3. Affect and behavior are appropriate  LABS/RADIOLOGY: Labs reviewed: Basic Metabolic Panel:  Recent Labs  02/02/16 0844  03/01/16 0955 03/11/16 1252 03/12/16 03/12/16 0412 03/17/16  NA 137  < > 140 137 135* 135 132*  K 4.0  < > 4.3 4.5  --  4.5 3.9  CL  --   --  108  --   --  104  --   CO2 26  --  26  --   --  25  --   GLUCOSE 101  --  87  92  --  113*  --   BUN 19.6  --  18  --  20 20 35*  CREATININE 1.2  --  1.15  --  0.9 0.94 1.3  CALCIUM 10.0  --  9.9  --   --  9.1  --   < > = values in this interval not displayed. Liver Function Tests:  Recent Labs  07/30/15 0814 10/29/15 0830 02/02/16 0844 03/17/16  AST 17 16 22 14   ALT 12 10 10  8*  ALKPHOS 104 106  91 82  BILITOT 0.62 0.39 0.36  --   PROT 8.0 8.1 9.4*  --   ALBUMIN 3.6 3.6 2.8*  --    CBC:  Recent Labs  02/02/16 0844  03/12/16 0412 03/13/16 0423 03/14/16 03/14/16 0421 03/17/16  WBC 24.0*  < > 25.4* 32.3* 32.5 32.5* 32.2  NEUTROABS 19.5*  --  20.9*  --   --   --  26  HGB 11.5*  < > 8.1* 9.4*  --  9.9* 9.8*  HCT 34.4*  < > 23.6* 27.0*  --  28.7* 30*  MCV 106.3*  < > 97.1 94.7  --  96.0  --   PLT 516*  < > 319 311  --  363 474*  < > = values in this interval not displayed.    Dg C-arm 1-60 Min-no Report  03/11/2016  CLINICAL DATA: surgery C-ARM 1-60 MINUTES Fluoroscopy was utilized by the requesting physician.  No radiographic interpretation.   Dg Hip Port Unilat With Pelvis 1v Left  03/11/2016  CLINICAL DATA:  81 year old male with a history of left hip replacement. EXAM: DG HIP (WITH OR WITHOUT PELVIS) 1V PORT LEFT COMPARISON:  03/11/2016 FINDINGS: Early postoperative changes of left total hip arthroplasty. Surgical clips project over the soft tissues of the left hip. Gas within the surgical bed. Urinary catheter in place. No complicating features/perihardware fracture. IMPRESSION: Early postoperative changes of left hip arthroplasty, with no complicating features. Signed, Dulcy Fanny. Earleen Newport, DO [facility] Electronically Signed   By: Corrie Mckusick D.O.   On: 03/11/2016 13:42   Dg Hip Operative Unilat With Pelvis Left  03/11/2016  CLINICAL DATA:  LEFT total hip replacement EXAM: OPERATIVE LEFT HIP (WITH PELVIS IF PERFORMED) 4 VIEWS TECHNIQUE: Fluoroscopic spot image(s) were submitted for  interpretation post-operatively. COMPARISON:  None FLUOROSCOPY TIME:  0 minutes 24 seconds Images obtained:  4 FINDINGS: Initial image demonstrates advanced osteoarthritic changes of the LEFT hip joint with joint space narrowing and spur formation. Bone on bones appearance superiorly with subchondral sclerosis. Subsequent images demonstrate placement of a LEFT hip prosthesis without fracture or dislocation. Visualized pelvis intact. Bones demineralized. IMPRESSION: Osteoarthritic changes of the LEFT with subsequent placement of a LEFT hip prosthesis without acute complication. Electronically Signed   By: Lavonia Dana M.D.   On: 03/11/2016 12:59    ASSESSMENT/PLAN:  Left hip OA S/P left total hip arthroplasty -  for Home health PT, OT, CNA,  And Nursing ; follow up with orthopedics; continue aspirin 325 mg daily for dvt prophylaxis; continue oxyIR 5 mg 1-2 tab q3h prn pain  Leukocytosis - wbc 32.2; afebrile; has history of myeloproliferative disorder  Blood loss anemia - hgb 9.8; stable  GERD - continue Nexium 10 mg bid   HTN - continue tenormin 25 mg daily, cozaar 50 mg daily, maxzide 37.5- 25 half a tablet daily and norvasc 5 mg bid.   afib - rate controlled. Continue digoxin and atenolol.   Allergic rhinitis - stable, continue flonase and allegra  Myeloproliferative disorder -continue hydroxurea  Constipation - continue colace 100 mg bid and miralax 17 g daily as needed      I have filled out patient's discharge paperwork and written prescriptions.  Patient will receive home health PT, OT, Nursing and CNA.  DME provided: Bedside commode and rolling walker  Total discharge time: Greater/less than 30 minutes  Discharge time involved coordination of the discharge process with social worker, nursing staff and therapy department. Medical  justification for home health services/DME verified.     Durenda Age, NP [employer] [phone]

## 2016-05-04 ENCOUNTER — Ambulatory Visit (HOSPITAL_BASED_OUTPATIENT_CLINIC_OR_DEPARTMENT_OTHER): Payer: Medicare Other | Admitting: Internal Medicine

## 2016-05-04 ENCOUNTER — Telehealth: Payer: Self-pay | Admitting: Internal Medicine

## 2016-05-04 ENCOUNTER — Other Ambulatory Visit (HOSPITAL_BASED_OUTPATIENT_CLINIC_OR_DEPARTMENT_OTHER): Payer: Medicare Other

## 2016-05-04 ENCOUNTER — Encounter: Payer: Self-pay | Admitting: Internal Medicine

## 2016-05-04 VITALS — BP 120/55 | HR 58 | Temp 97.9°F | Resp 17 | Wt 161.1 lb

## 2016-05-04 DIAGNOSIS — D72829 Elevated white blood cell count, unspecified: Secondary | ICD-10-CM

## 2016-05-04 DIAGNOSIS — D471 Chronic myeloproliferative disease: Secondary | ICD-10-CM | POA: Diagnosis not present

## 2016-05-04 LAB — CBC WITH DIFFERENTIAL/PLATELET
BASO%: 0.3 % (ref 0.0–2.0)
Basophils Absolute: 0.1 10*3/uL (ref 0.0–0.1)
EOS ABS: 0.1 10*3/uL (ref 0.0–0.5)
EOS%: 0.4 % (ref 0.0–7.0)
HEMATOCRIT: 28.1 % — AB (ref 38.4–49.9)
HEMOGLOBIN: 8.8 g/dL — AB (ref 13.0–17.1)
LYMPH#: 2.2 10*3/uL (ref 0.9–3.3)
LYMPH%: 10.5 % — AB (ref 14.0–49.0)
MCH: 31 pg (ref 27.2–33.4)
MCHC: 31.3 g/dL — ABNORMAL LOW (ref 32.0–36.0)
MCV: 98.9 fL — AB (ref 79.3–98.0)
MONO#: 1.7 10*3/uL — ABNORMAL HIGH (ref 0.1–0.9)
MONO%: 8 % (ref 0.0–14.0)
NEUT%: 80.8 % — ABNORMAL HIGH (ref 39.0–75.0)
NEUTROS ABS: 16.8 10*3/uL — AB (ref 1.5–6.5)
PLATELETS: 466 10*3/uL — AB (ref 140–400)
RBC: 2.84 10*6/uL — AB (ref 4.20–5.82)
RDW: 14.4 % (ref 11.0–14.6)
WBC: 20.8 10*3/uL — AB (ref 4.0–10.3)

## 2016-05-04 LAB — COMPREHENSIVE METABOLIC PANEL
AST: 25 U/L (ref 5–34)
Albumin: 2.3 g/dL — ABNORMAL LOW (ref 3.5–5.0)
Alkaline Phosphatase: 75 U/L (ref 40–150)
Anion Gap: 8 mEq/L (ref 3–11)
BUN: 17.8 mg/dL (ref 7.0–26.0)
CHLORIDE: 99 meq/L (ref 98–109)
CO2: 27 meq/L (ref 22–29)
Calcium: 10.1 mg/dL (ref 8.4–10.4)
Creatinine: 1.2 mg/dL (ref 0.7–1.3)
EGFR: 51 mL/min/{1.73_m2} — ABNORMAL LOW (ref >90)
GLUCOSE: 96 mg/dL (ref 70–140)
POTASSIUM: 4.2 meq/L (ref 3.5–5.1)
Sodium: 133 mEq/L — ABNORMAL LOW (ref 136–145)
Total Bilirubin: 0.3 mg/dL (ref 0.20–1.20)
Total Protein: 9.4 g/dL — ABNORMAL HIGH (ref 6.4–8.3)

## 2016-05-04 LAB — LACTATE DEHYDROGENASE: LDH: 148 U/L (ref 125–245)

## 2016-05-04 NOTE — Progress Notes (Signed)
[place] [phone]   Fax:(336) [place], [place] [address]  DIAGNOSIS: Myeloproliferative disorder with positive JAK-2 mutation.  PRIOR THERAPY: None.  CURRENT THERAPY: Hydrea 500 mg by mouth daily. Status post approximately 14 months of treatment.   INTERVAL HISTORY: Adam Tapia 82 y.o. male returns to the clinic today for three-month followup visit accompanied by his wife. The patient is feeling fine today with no significant complaints except for mild fatigue. He is currently on treatment with hydroxyurea 500 mg by mouth daily and tolerating it fairly well with no significant adverse effects. He underwent left hip replacement in April 2017. He is recovering well from his surgery. He has intermittent hiccups. He denied having any significant chest pain, shortness of breath, cough or hemoptysis. He has no significant weight loss or night sweats. He has no nausea or vomiting. He has repeat CBC performed earlier today and he is here for evaluation and discussion of his lab results.  ALLERGIES:  is allergic to nsaids.  MEDICATIONS:  Current Outpatient Prescriptions  Medication Sig Dispense Refill  . acetaminophen (TYLENOL) 325 MG tablet Take 650 mg by mouth every 6 (six) hours as needed for moderate pain.     Marland Kitchen amLODipine (NORVASC) 5 MG tablet Take 5 mg by mouth 2 (two) times daily.     Marland Kitchen aspirin 81 MG tablet Take 81 mg by mouth daily.    Marland Kitchen atenolol (TENORMIN) 25 MG tablet Take 25 mg by mouth daily.  5  . digoxin (LANOXIN) 0.25 MG tablet Take 0.25 mg by mouth daily.    Marland Kitchen docusate sodium (COLACE) 100 MG capsule Take 100 mg by mouth 2 (two) times daily.    . fexofenadine (ALLEGRA) 180 MG tablet Take 180 mg by mouth daily.    . fluticasone (FLONASE) 50 MCG/ACT nasal spray Place 1 spray into both nostrils daily as needed for allergies.     . Homeopathic Products (ARNICARE ARNICA)  CREA     . hydroxyurea (HYDREA) 500 MG capsule TAKE 1 CAPSULE (500 MG TOTAL) BY MOUTH DAILY. MAY TAKE WITH FOOD TO MINIMIZE GI SIDE EFFECTS. 90 capsule 1  . ketotifen (ZADITOR) 0.025 % ophthalmic solution 1 drop 2 (two) times daily.    Marland Kitchen losartan (COZAAR) 50 MG tablet Take 50 mg by mouth every morning.  1  . oxyCODONE (OXY IR/ROXICODONE) 5 MG immediate release tablet Take 1-2 tablets (5-10 mg total) by mouth every 3 (three) hours as needed for breakthrough pain. 90 tablet 0  . Protein (PROCEL 100) POWD Take 2 scoop by mouth 2 (two) times daily.    Marland Kitchen triamterene-hydrochlorothiazide (MAXZIDE-25) 37.5-25 MG per tablet Take 0.5 tablets by mouth daily.     . TURMERIC PO Take 1 tablet by mouth daily.     No current facility-administered medications for this visit.    REVIEW OF SYSTEMS:  A comprehensive review of systems was negative except for: Constitutional: positive for fatigue   PHYSICAL EXAMINATION: General appearance: alert, cooperative, fatigued and no distress Head: Normocephalic, without obvious abnormality, atraumatic Neck: no adenopathy Lymph nodes: Cervical, supraclavicular, and axillary nodes normal. Resp: clear to auscultation bilaterally Cardio: regular rate and rhythm, S1, S2 normal, no murmur, click, rub or gallop GI: soft, non-tender; bowel sounds normal; no masses,  no organomegaly Extremities: extremities normal, atraumatic, no cyanosis or edema  ECOG PERFORMANCE STATUS: 1 - Symptomatic but completely ambulatory  Blood pressure 120/55,  pulse 58, temperature 97.9 F (36.6 C), temperature source Oral, resp. rate 17, weight 161 lb 1.6 oz (73.074 kg), SpO2 99 %.  LABORATORY DATA: Lab Results  Component Value Date   WBC 20.8* 05/04/2016   HGB 8.8* 05/04/2016   HCT 28.1* 05/04/2016   MCV 98.9* 05/04/2016   PLT 466* 05/04/2016      Chemistry      Component Value Date/Time   NA 132* 03/17/2016   NA 135 03/12/2016 0412   NA 137 02/02/2016 0844   K 3.9 03/17/2016   K  4.0 02/02/2016 0844   CL 104 03/12/2016 0412   CO2 25 03/12/2016 0412   CO2 26 02/02/2016 0844   BUN 35* 03/17/2016   BUN 20 03/12/2016 0412   BUN 19.6 02/02/2016 0844   CREATININE 1.3 03/17/2016   CREATININE 0.94 03/12/2016 0412   CREATININE 1.2 02/02/2016 0844   GLU 88 03/17/2016      Component Value Date/Time   CALCIUM 9.1 03/12/2016 0412   CALCIUM 10.0 02/02/2016 0844   ALKPHOS 82 03/17/2016   ALKPHOS 91 02/02/2016 0844   AST 14 03/17/2016   AST 22 02/02/2016 0844   ALT 8* 03/17/2016   ALT 10 02/02/2016 0844   BILITOT 0.36 02/02/2016 0844       RADIOGRAPHIC STUDIES: No results found.  ASSESSMENT AND PLAN: Very pleasant 82 years old [place] American male with persistent leukocytosis secondary to myeloproliferative disorder with positive JAK-2 mutation. He is currently undergoing treatment with hydroxyurea 500 mg by mouth daily and tolerating his treatment fairly well. His CBC showed persistent leukocytosis But better compared to 3 months ago and platelets countiIs still elevated but within the acceptable range. He also has worsening anemia after his hip replacement most likely secondary to blood loss. I recommended for the patient to start taking oral iron tablets over the counter at regular basis with a stool softener. I recommended for him to continue on hydroxyurea 500 mg by mouth daily. I would see him back for followup visit in 3 months with repeat CBC, comprehensive metabolic pane and LDH. He was advised to call immediately if she has any concerning symptoms in the interval. The patient voices understanding of current disease status and treatment options and is in agreement with the current care plan.  All questions were answered. The patient knows to call the clinic with any problems, questions or concerns. We can certainly see the patient much sooner if necessary.  Disclaimer: This note was dictated with voice recognition software. Similar sounding words can  inadvertently be transcribed and may not be corrected upon review.

## 2016-05-04 NOTE — Telephone Encounter (Signed)
per pofto sch pt app-gave pt copy of avs

## 2016-06-04 ENCOUNTER — Other Ambulatory Visit: Payer: Self-pay | Admitting: Adult Health

## 2016-06-08 ENCOUNTER — Other Ambulatory Visit: Payer: Self-pay | Admitting: Adult Health

## 2016-06-09 ENCOUNTER — Other Ambulatory Visit: Payer: Self-pay | Admitting: Adult Health

## 2016-06-17 ENCOUNTER — Other Ambulatory Visit: Payer: Self-pay | Admitting: Adult Health

## 2016-06-20 ENCOUNTER — Other Ambulatory Visit: Payer: Self-pay | Admitting: Internal Medicine

## 2016-06-20 DIAGNOSIS — D471 Chronic myeloproliferative disease: Secondary | ICD-10-CM

## 2016-08-04 ENCOUNTER — Ambulatory Visit (HOSPITAL_BASED_OUTPATIENT_CLINIC_OR_DEPARTMENT_OTHER): Payer: Medicare Other | Admitting: Internal Medicine

## 2016-08-04 ENCOUNTER — Encounter: Payer: Self-pay | Admitting: Internal Medicine

## 2016-08-04 ENCOUNTER — Telehealth: Payer: Self-pay | Admitting: Internal Medicine

## 2016-08-04 ENCOUNTER — Other Ambulatory Visit (HOSPITAL_BASED_OUTPATIENT_CLINIC_OR_DEPARTMENT_OTHER): Payer: Medicare Other

## 2016-08-04 VITALS — BP 142/67 | HR 57 | Temp 98.4°F | Resp 16 | Ht 68.0 in | Wt 172.0 lb

## 2016-08-04 DIAGNOSIS — D72829 Elevated white blood cell count, unspecified: Secondary | ICD-10-CM

## 2016-08-04 DIAGNOSIS — D471 Chronic myeloproliferative disease: Secondary | ICD-10-CM

## 2016-08-04 LAB — COMPREHENSIVE METABOLIC PANEL
ALBUMIN: 2.7 g/dL — AB (ref 3.5–5.0)
ALK PHOS: 122 U/L (ref 40–150)
ALT: 10 U/L (ref 0–55)
ANION GAP: 7 meq/L (ref 3–11)
AST: 18 U/L (ref 5–34)
BILIRUBIN TOTAL: 0.42 mg/dL (ref 0.20–1.20)
BUN: 19.7 mg/dL (ref 7.0–26.0)
CALCIUM: 10.2 mg/dL (ref 8.4–10.4)
CO2: 25 mEq/L (ref 22–29)
Chloride: 103 mEq/L (ref 98–109)
Creatinine: 1.1 mg/dL (ref 0.7–1.3)
EGFR: 58 mL/min/{1.73_m2} — AB (ref >90)
GLUCOSE: 85 mg/dL (ref 70–140)
Potassium: 4.3 mEq/L (ref 3.5–5.1)
Sodium: 135 mEq/L — ABNORMAL LOW (ref 136–145)
TOTAL PROTEIN: 9.5 g/dL — AB (ref 6.4–8.3)

## 2016-08-04 LAB — CBC WITH DIFFERENTIAL/PLATELET
BASO%: 0.2 % (ref 0.0–2.0)
BASOS ABS: 0.1 10*3/uL (ref 0.0–0.1)
EOS ABS: 0.1 10*3/uL (ref 0.0–0.5)
EOS%: 0.4 % (ref 0.0–7.0)
HCT: 35 % — ABNORMAL LOW (ref 38.4–49.9)
HEMOGLOBIN: 11.8 g/dL — AB (ref 13.0–17.1)
LYMPH%: 10.6 % — ABNORMAL LOW (ref 14.0–49.0)
MCH: 34 pg — ABNORMAL HIGH (ref 27.2–33.4)
MCHC: 33.7 g/dL (ref 32.0–36.0)
MCV: 100.9 fL — AB (ref 79.3–98.0)
MONO#: 1.6 10*3/uL — AB (ref 0.1–0.9)
MONO%: 5.7 % (ref 0.0–14.0)
NEUT%: 83.1 % — ABNORMAL HIGH (ref 39.0–75.0)
NEUTROS ABS: 23.8 10*3/uL — AB (ref 1.5–6.5)
PLATELETS: 465 10*3/uL — AB (ref 140–400)
RBC: 3.47 10*6/uL — ABNORMAL LOW (ref 4.20–5.82)
RDW: 15.1 % — AB (ref 11.0–14.6)
WBC: 28.7 10*3/uL — AB (ref 4.0–10.3)
lymph#: 3 10*3/uL (ref 0.9–3.3)

## 2016-08-04 LAB — LACTATE DEHYDROGENASE: LDH: 198 U/L (ref 125–245)

## 2016-08-04 NOTE — Progress Notes (Signed)
[place] Telephone:([phone]   Fax:(336) [place], [place] [address]  DIAGNOSIS: Myeloproliferative disorder with positive JAK-2 mutation.  PRIOR THERAPY: None.  CURRENT THERAPY: Hydrea 500 mg by mouth daily. Status post approximately 17 months of treatment.   INTERVAL HISTORY: Adam Tapia 82 y.o. male returns to the clinic today for three-month followup visit accompanied by his wife. The patient is feeling fine today with no significant complaints except for mild fatigue. He is currently on treatment with hydroxyurea 500 mg by mouth daily and tolerating it fairly well with no significant adverse effects. He underwent left hip replacement in April 2017. He is recovering well from his surgery. He has intermittent hiccups. He denied having any significant chest pain, shortness of breath, cough or hemoptysis. He has no significant weight loss or night sweats. He has no nausea or vomiting. He has repeat CBC performed earlier today and he is here for evaluation and discussion of his lab results.  ALLERGIES:  is allergic to nsaids.  MEDICATIONS:  Current Outpatient Prescriptions  Medication Sig Dispense Refill  . amLODipine (NORVASC) 5 MG tablet Take 5 mg by mouth 2 (two) times daily.     Marland Kitchen aspirin 81 MG tablet Take 81 mg by mouth daily.    Marland Kitchen atenolol (TENORMIN) 25 MG tablet Take 25 mg by mouth daily.  5  . docusate sodium (COLACE) 100 MG capsule Take 100 mg by mouth 2 (two) times daily.    . fexofenadine (ALLEGRA) 180 MG tablet Take 180 mg by mouth daily.    . fluticasone (FLONASE) 50 MCG/ACT nasal spray Place 1 spray into both nostrils daily as needed for allergies.     . Homeopathic Products (ARNICARE ARNICA) CREA     . hydroxyurea (HYDREA) 500 MG capsule TAKE 1 CAPSULE (500 MG TOTAL) BY MOUTH DAILY. MAY TAKE WITH FOOD TO MINIMIZE GI SIDE EFFECTS. 90 capsule 0  . ketotifen (ZADITOR) 0.025  % ophthalmic solution 1 drop 2 (two) times daily.    Marland Kitchen losartan (COZAAR) 50 MG tablet Take 50 mg by mouth every morning.  1  . oxyCODONE (OXY IR/ROXICODONE) 5 MG immediate release tablet Take 1-2 tablets (5-10 mg total) by mouth every 3 (three) hours as needed for breakthrough pain. 90 tablet 0  . acetaminophen (TYLENOL) 325 MG tablet Take 650 mg by mouth every 6 (six) hours as needed for moderate pain.     Marland Kitchen esomeprazole (NEXIUM) 20 MG capsule     . Protein (PROCEL 100) POWD Take 2 scoop by mouth 2 (two) times daily.    Marland Kitchen triamterene-hydrochlorothiazide (MAXZIDE-25) 37.5-25 MG per tablet Take 0.5 tablets by mouth daily.     . TURMERIC PO Take 1 tablet by mouth daily.     No current facility-administered medications for this visit.     REVIEW OF SYSTEMS:  A comprehensive review of systems was negative except for: Constitutional: positive for fatigue   PHYSICAL EXAMINATION: General appearance: alert, cooperative, fatigued and no distress Head: Normocephalic, without obvious abnormality, atraumatic Neck: no adenopathy Lymph nodes: Cervical, supraclavicular, and axillary nodes normal. Resp: clear to auscultation bilaterally Cardio: regular rate and rhythm, S1, S2 normal, no murmur, click, rub or gallop GI: soft, non-tender; bowel sounds normal; no masses,  no organomegaly Extremities: extremities normal, atraumatic, no cyanosis or edema  ECOG PERFORMANCE STATUS: 1 - Symptomatic but completely ambulatory  Blood pressure (!) 142/67, pulse (!) 57,  temperature 98.4 F (36.9 C), temperature source Oral, resp. rate 16, height 5\' 8"  (1.727 m), weight 172 lb (78 kg), SpO2 100 %.  LABORATORY DATA: Lab Results  Component Value Date   WBC 28.7 (H) 08/04/2016   HGB 11.8 (L) 08/04/2016   HCT 35.0 (L) 08/04/2016   MCV 100.9 (H) 08/04/2016   PLT 465 (H) 08/04/2016      Chemistry      Component Value Date/Time   NA 133 (L) 05/04/2016 0916   K 4.2 05/04/2016 0916   CL 104 03/12/2016 0412    CO2 27 05/04/2016 0916   BUN 17.8 05/04/2016 0916   CREATININE 1.2 05/04/2016 0916   GLU 88 03/17/2016      Component Value Date/Time   CALCIUM 10.1 05/04/2016 0916   ALKPHOS 75 05/04/2016 0916   AST 25 05/04/2016 0916   ALT <9 05/04/2016 0916   BILITOT 0.30 05/04/2016 0916       RADIOGRAPHIC STUDIES: No results found.  ASSESSMENT AND PLAN: Very pleasant 82 years old [place] American male with persistent leukocytosis secondary to myeloproliferative disorder with positive JAK-2 mutation. He is currently undergoing treatment with hydroxyurea 500 mg by mouth daily and tolerating his treatment fairly well. CBC today showed stable platelets count with improvement of his hemoglobin and hematocrit as well as a slight elevation of the leukocyte count. I discussed the lab result with the patient and his wife. I recommended for him to continue on hydroxyurea 500 mg by mouth daily. I would see him back for followup visit in 3 months with repeat CBC, comprehensive metabolic pane and LDH. He was advised to call immediately if she has any concerning symptoms in the interval. The patient voices understanding of current disease status and treatment options and is in agreement with the current care plan.  All questions were answered. The patient knows to call the clinic with any problems, questions or concerns. We can certainly see the patient much sooner if necessary.  Disclaimer: This note was dictated with voice recognition software. Similar sounding words can inadvertently be transcribed and may not be corrected upon review.

## 2016-08-04 NOTE — Telephone Encounter (Signed)
Avs report and schedule given to patient, per 08/04/16 los. °

## 2016-09-17 ENCOUNTER — Other Ambulatory Visit: Payer: Self-pay | Admitting: Internal Medicine

## 2016-09-17 DIAGNOSIS — D471 Chronic myeloproliferative disease: Secondary | ICD-10-CM

## 2016-11-03 ENCOUNTER — Other Ambulatory Visit: Payer: Medicare Other

## 2016-11-03 ENCOUNTER — Ambulatory Visit: Payer: Medicare Other | Admitting: Internal Medicine

## 2016-11-28 ENCOUNTER — Ambulatory Visit (INDEPENDENT_AMBULATORY_CARE_PROVIDER_SITE_OTHER): Payer: Medicare Other

## 2016-11-28 ENCOUNTER — Ambulatory Visit (INDEPENDENT_AMBULATORY_CARE_PROVIDER_SITE_OTHER): Payer: Medicare Other | Admitting: Physician Assistant

## 2016-11-28 DIAGNOSIS — Z96642 Presence of left artificial hip joint: Secondary | ICD-10-CM | POA: Diagnosis not present

## 2016-11-28 NOTE — Progress Notes (Signed)
   Office Visit Note   Patient: Adam Tapia           Date of Birth: 1923/10/15           MRN: [id number] Visit Date: 11/28/2016              Requested by: [name], MD [place], [place] [address] PCP: Gennette Pac, MD   Assessment & Plan: Visit Diagnoses:  1. History of total left hip arthroplasty     Plan: He will follow up with [place] on an as-needed basis. Iliotibial band stretching exercises shown .  Follow-Up Instructions: Return if symptoms worsen or fail to improve.   Orders:  Orders Placed This Encounter  Procedures  . XR Pelvis 1-2 Views   No orders of the defined types were placed in this encounter.     Procedures: No procedures performed   Clinical Data: No additional findings.   Subjective: Chief Complaint  Patient presents with  . Left Hip - Follow-up    Patient states he is doing well. Ambulates with a cane. States ROM and strength are doing well    HPI Adam Tapia returns today status post left total hip arthroplasty 9 months postop. He overall is doing very well. Some slight soreness at the lateral aspect of hip at times. Review of Systems   Objective: Vital Signs: There were no vitals taken for this visit.  Physical Exam  Constitutional: He appears well-developed and well-nourished. No distress.    Ortho Exam Excellent range of motion of the left hip without pain. Specialty Comments:  No specialty comments available.  Imaging: No results found.   PMFS History: Patient Active Problem List   Diagnosis Date Noted  . Osteoarthritis of left hip 03/11/2016  . Status post total replacement of right hip 03/11/2016  . Myeloproliferative disorder ([place]) 08/27/2014  . Leukocytosis 06/03/2013   Past Medical History:  Diagnosis Date  . Acute blood loss anemia   . AF (atrial fibrillation) ([place])   . Allergic rhinitis   . Anemia   . Arthritis   . BPH (benign prostatic hyperplasia)   . Constipation   . Dysrhythmia      PT STATES HAS HX A FIB - FOLLOWED BY DR.KEVIN LITTLE  . GERD (gastroesophageal reflux disease)    OCCASIONAL - HAS NEXIUM IF NEEDED - NOT CURRENTLY TAKING  . Hypertension   . Myeloproliferative disorder ([place])    FOLLOWED BY DR. Julien Nordmann  AT [place]  . Nocturia   . Primary osteoarthritis of left hip   . Psoriasis   . Unsteady gait     No family history on file.  Past Surgical History:  Procedure Laterality Date  . HERNIA REPAIR  1998  . TOTAL HIP ARTHROPLASTY Left 03/11/2016   Procedure: LEFT TOTAL HIP ARTHROPLASTY ANTERIOR APPROACH;  Surgeon: Mcarthur Rossetti, MD;  Location: [facility]S;  Service: Orthopedics;  Laterality: Left;  Went from [facility] to an [place] History  . Not on file.   Social History Main Topics  . Smoking status: Never Smoker  . Smokeless tobacco: Never Used  . Alcohol use No  . Drug use: No  . Sexual activity: Not on file

## 2016-12-02 ENCOUNTER — Telehealth: Payer: Self-pay | Admitting: Internal Medicine

## 2016-12-02 NOTE — Telephone Encounter (Signed)
Patient called to have December appointments rescheduled prefers appointments to start in am after 9. Rescheduled to next available.

## 2016-12-06 ENCOUNTER — Telehealth: Payer: Self-pay | Admitting: Internal Medicine

## 2016-12-06 NOTE — Telephone Encounter (Signed)
Mailed pt records to [facility] risk adjustment

## 2016-12-15 ENCOUNTER — Other Ambulatory Visit: Payer: Self-pay | Admitting: Internal Medicine

## 2016-12-15 DIAGNOSIS — D471 Chronic myeloproliferative disease: Secondary | ICD-10-CM

## 2017-01-02 ENCOUNTER — Telehealth: Payer: Self-pay | Admitting: Internal Medicine

## 2017-01-02 ENCOUNTER — Ambulatory Visit (HOSPITAL_BASED_OUTPATIENT_CLINIC_OR_DEPARTMENT_OTHER): Payer: Medicare Other | Admitting: Internal Medicine

## 2017-01-02 ENCOUNTER — Encounter: Payer: Self-pay | Admitting: Internal Medicine

## 2017-01-02 ENCOUNTER — Other Ambulatory Visit (HOSPITAL_BASED_OUTPATIENT_CLINIC_OR_DEPARTMENT_OTHER): Payer: Medicare Other

## 2017-01-02 VITALS — BP 145/74 | HR 59 | Temp 98.7°F | Resp 19 | Wt 185.5 lb

## 2017-01-02 DIAGNOSIS — D72829 Elevated white blood cell count, unspecified: Secondary | ICD-10-CM | POA: Diagnosis not present

## 2017-01-02 DIAGNOSIS — D471 Chronic myeloproliferative disease: Secondary | ICD-10-CM

## 2017-01-02 DIAGNOSIS — D473 Essential (hemorrhagic) thrombocythemia: Secondary | ICD-10-CM | POA: Diagnosis not present

## 2017-01-02 DIAGNOSIS — N400 Enlarged prostate without lower urinary tract symptoms: Secondary | ICD-10-CM | POA: Diagnosis not present

## 2017-01-02 LAB — CBC WITH DIFFERENTIAL/PLATELET
BASO%: 0.3 % (ref 0.0–2.0)
BASOS ABS: 0.1 10*3/uL (ref 0.0–0.1)
EOS ABS: 0.2 10*3/uL (ref 0.0–0.5)
EOS%: 0.5 % (ref 0.0–7.0)
HCT: 43.1 % (ref 38.4–49.9)
HGB: 14.4 g/dL (ref 13.0–17.1)
LYMPH%: 7.1 % — AB (ref 14.0–49.0)
MCH: 35 pg — ABNORMAL HIGH (ref 27.2–33.4)
MCHC: 33.3 g/dL (ref 32.0–36.0)
MCV: 105 fL — AB (ref 79.3–98.0)
MONO#: 2 10*3/uL — ABNORMAL HIGH (ref 0.1–0.9)
MONO%: 5.8 % (ref 0.0–14.0)
NEUT%: 86.3 % — AB (ref 39.0–75.0)
NEUTROS ABS: 29.6 10*3/uL — AB (ref 1.5–6.5)
Platelets: 468 10*3/uL — ABNORMAL HIGH (ref 140–400)
RBC: 4.1 10*6/uL — ABNORMAL LOW (ref 4.20–5.82)
RDW: 15 % — ABNORMAL HIGH (ref 11.0–14.6)
WBC: 34.3 10*3/uL — AB (ref 4.0–10.3)
lymph#: 2.4 10*3/uL (ref 0.9–3.3)

## 2017-01-02 LAB — COMPREHENSIVE METABOLIC PANEL
ALT: 10 U/L (ref 0–55)
AST: 19 U/L (ref 5–34)
Albumin: 3.4 g/dL — ABNORMAL LOW (ref 3.5–5.0)
Alkaline Phosphatase: 167 U/L — ABNORMAL HIGH (ref 40–150)
Anion Gap: 6 mEq/L (ref 3–11)
BUN: 28.2 mg/dL — ABNORMAL HIGH (ref 7.0–26.0)
CO2: 25 meq/L (ref 22–29)
Calcium: 10.3 mg/dL (ref 8.4–10.4)
Chloride: 107 mEq/L (ref 98–109)
Creatinine: 1.3 mg/dL (ref 0.7–1.3)
EGFR: 48 mL/min/{1.73_m2} — AB (ref >90)
GLUCOSE: 96 mg/dL (ref 70–140)
POTASSIUM: 4.5 meq/L (ref 3.5–5.1)
SODIUM: 137 meq/L (ref 136–145)
Total Bilirubin: 0.66 mg/dL (ref 0.20–1.20)
Total Protein: 9 g/dL — ABNORMAL HIGH (ref 6.4–8.3)

## 2017-01-02 LAB — LACTATE DEHYDROGENASE: LDH: 288 U/L — ABNORMAL HIGH (ref 125–245)

## 2017-01-02 NOTE — Progress Notes (Signed)
[facility] Telephone:([phone]   Fax:(336) [place], [place] [address]  DIAGNOSIS: Myeloproliferative disorder with positive JAK-2 mutation.  PRIOR THERAPY: None.  CURRENT THERAPY: Hydrea 500 mg by mouth daily. Status post approximately 23 months months of treatment.   INTERVAL HISTORY: Adam Tapia 83 y.o. male came to the clinic today for six-month follow-up visit. The patient is feeling fine today was no specific complaints except for some increased urine frequency. He has not seen his urologist for a while. He is interested in the evaluation for his benign prostatic hypertrophy. He denied having any chest pain, shortness of breath, cough or hemoptysis. He has no fever or chills. He denied having any significant weight loss or night sweats. He continues to have arthralgia from the osteoarthritis. He is tolerating his treatment with hydroxyurea fairly well. She had repeat CBC and comprehensive metabolic panel performed earlier today and he is here for evaluation and discussion of his lab results.   ALLERGIES:  is allergic to nsaids.  MEDICATIONS:  Current Outpatient Prescriptions  Medication Sig Dispense Refill  . acetaminophen (TYLENOL) 325 MG tablet Take 650 mg by mouth every 6 (six) hours as needed for moderate pain.     Marland Kitchen amLODipine (NORVASC) 5 MG tablet Take 5 mg by mouth 2 (two) times daily.     Marland Kitchen aspirin 81 MG tablet Take 81 mg by mouth daily.    Marland Kitchen atenolol (TENORMIN) 25 MG tablet Take 25 mg by mouth daily.  5  . docusate sodium (COLACE) 100 MG capsule Take 100 mg by mouth 2 (two) times daily.    Marland Kitchen esomeprazole (NEXIUM) 20 MG capsule     . fexofenadine (ALLEGRA) 180 MG tablet Take 180 mg by mouth daily.    . fluticasone (FLONASE) 50 MCG/ACT nasal spray Place 1 spray into both nostrils daily as needed for allergies.     . Homeopathic Products (ARNICARE [employer]NICA) CREA     .  hydroxyurea (HYDREA) 500 MG capsule TAKE 1 CAPSULE (500 MG TOTAL) BY MOUTH DAILY. MAY TAKE WITH FOOD TO MINIMIZE GI SIDE EFFECTS. 90 capsule 0  . ketotifen (ZADITOR) 0.025 % ophthalmic solution 1 drop 2 (two) times daily.    Marland Kitchen losartan (COZAAR) 50 MG tablet Take 50 mg by mouth every morning.  1  . oxyCODONE (OXY IR/ROXICODONE) 5 MG immediate release tablet Take 1-2 tablets (5-10 mg total) by mouth every 3 (three) hours as needed for breakthrough pain. 90 tablet 0  . Protein (PROCEL 100) POWD Take 2 scoop by mouth 2 (two) times daily.    Marland Kitchen triamterene-hydrochlorothiazide (MAXZIDE-25) 37.5-25 MG per tablet Take 0.5 tablets by mouth daily.     . TURMERIC PO Take 1 tablet by mouth daily.     No current facility-administered medications for this visit.     REVIEW OF SYSTEMS:  A comprehensive review of systems was negative except for: Constitutional: positive for fatigue Genitourinary: positive for frequency Musculoskeletal: positive for arthralgias   PHYSICAL EXAMINATION: General appearance: alert, cooperative, fatigued and no distress Head: Normocephalic, without obvious abnormality, atraumatic Neck: no adenopathy Lymph nodes: Cervical, supraclavicular, and axillary nodes normal. Resp: clear to auscultation bilaterally Back: symmetric, no curvature. ROM normal. No CVA tenderness. Cardio: regular rate and rhythm, S1, S2 normal, no murmur, click, rub or gallop GI: soft, non-tender; bowel sounds normal; no masses,  no organomegaly Extremities: extremities normal, atraumatic, no cyanosis or edema  ECOG PERFORMANCE STATUS: 1 - Symptomatic but completely ambulatory  Blood pressure (!) 145/74, pulse (!) 59, temperature 98.7 F (37.1 C), temperature source Oral, resp. rate 19, weight 185 lb 8 oz (84.1 kg), SpO2 94 %.  LABORATORY DATA: Lab Results  Component Value Date   WBC 34.3 (H) 01/02/2017   HGB 14.4 01/02/2017   HCT 43.1 01/02/2017   MCV 105.0 (H) 01/02/2017   PLT 468 (H) 01/02/2017       Chemistry      Component Value Date/Time   NA 137 01/02/2017 0914   K 4.5 01/02/2017 0914   CL 104 03/12/2016 0412   CO2 25 01/02/2017 0914   BUN 28.2 (H) 01/02/2017 0914   CREATININE 1.3 01/02/2017 0914   GLU 88 03/17/2016      Component Value Date/Time   CALCIUM 10.3 01/02/2017 0914   ALKPHOS 167 (H) 01/02/2017 0914   AST 19 01/02/2017 0914   ALT 10 01/02/2017 0914   BILITOT 0.66 01/02/2017 0914       RADIOGRAPHIC STUDIES: No results found.  ASSESSMENT AND PLAN:  This is a very pleasant 83 years old African-American male with myeloproliferative disorder and persistent leukocytosis and thrombocytosis with positive JAK-2 mutation. He is currently undergoing treatment with hydroxyurea 500 mg by mouth daily and tolerating his treatment well. Her CBC today showed further increase in the total white blood count. I discussed the lab result with the patient today. I recommended for him to continue with the current treatment with hydroxyurea at 500 mg by mouth daily. I will see him back for follow-up visit in 3 months for evaluation and repeat CBC, comprehensive metabolic panel and LDH. For the benign prostatic hypertrophy, I advised the patient to discuss with his primary care physician or see his urologist for reevaluation. He was advised to call immediately if he has any concerning symptoms in the interval. The patient voices understanding of current disease status and treatment options and is in agreement with the current care plan.  All questions were answered. The patient knows to call the clinic with any problems, questions or concerns. We can certainly see the patient much sooner if necessary. I spent 10 minutes counseling the patient face to face. The total time spent in the appointment was 15 minutes.  Disclaimer: This note was dictated with voice recognition software. Similar sounding words can inadvertently be transcribed and may not be corrected upon review.

## 2017-01-02 NOTE — Telephone Encounter (Signed)
Appointments scheduled per 2/12 LOS. Patient given AVS report and calendars with future scheduled appointments. °

## 2017-03-13 ENCOUNTER — Other Ambulatory Visit: Payer: Self-pay | Admitting: Medical Oncology

## 2017-03-13 DIAGNOSIS — D471 Chronic myeloproliferative disease: Secondary | ICD-10-CM

## 2017-03-13 MED ORDER — HYDROXYUREA 500 MG PO CAPS: ORAL_CAPSULE | ORAL | 0 refills | Status: DC

## 2017-04-03 ENCOUNTER — Ambulatory Visit (HOSPITAL_BASED_OUTPATIENT_CLINIC_OR_DEPARTMENT_OTHER): Payer: Medicare Other | Admitting: Internal Medicine

## 2017-04-03 ENCOUNTER — Telehealth: Payer: Self-pay | Admitting: Internal Medicine

## 2017-04-03 ENCOUNTER — Other Ambulatory Visit (HOSPITAL_BASED_OUTPATIENT_CLINIC_OR_DEPARTMENT_OTHER): Payer: Medicare Other

## 2017-04-03 ENCOUNTER — Encounter: Payer: Self-pay | Admitting: Internal Medicine

## 2017-04-03 VITALS — BP 129/57 | HR 58 | Temp 98.5°F | Resp 17 | Ht 68.0 in | Wt 189.5 lb

## 2017-04-03 DIAGNOSIS — D471 Chronic myeloproliferative disease: Secondary | ICD-10-CM

## 2017-04-03 DIAGNOSIS — D72829 Elevated white blood cell count, unspecified: Secondary | ICD-10-CM | POA: Diagnosis not present

## 2017-04-03 LAB — COMPREHENSIVE METABOLIC PANEL
ALBUMIN: 3.3 g/dL — AB (ref 3.5–5.0)
ALK PHOS: 165 U/L — AB (ref 40–150)
ALT: 15 U/L (ref 0–55)
ANION GAP: 6 meq/L (ref 3–11)
AST: 19 U/L (ref 5–34)
BUN: 19.6 mg/dL (ref 7.0–26.0)
CO2: 25 meq/L (ref 22–29)
CREATININE: 1.2 mg/dL (ref 0.7–1.3)
Calcium: 9.6 mg/dL (ref 8.4–10.4)
Chloride: 107 mEq/L (ref 98–109)
EGFR: 50 mL/min/{1.73_m2} — ABNORMAL LOW (ref >90)
Glucose: 98 mg/dl (ref 70–140)
Potassium: 4.2 mEq/L (ref 3.5–5.1)
Sodium: 138 mEq/L (ref 136–145)
Total Bilirubin: 0.56 mg/dL (ref 0.20–1.20)
Total Protein: 8.2 g/dL (ref 6.4–8.3)

## 2017-04-03 LAB — CBC WITH DIFFERENTIAL/PLATELET
BASO%: 0.5 % (ref 0.0–2.0)
Basophils Absolute: 0.2 10*3/uL — ABNORMAL HIGH (ref 0.0–0.1)
EOS ABS: 0.1 10*3/uL (ref 0.0–0.5)
EOS%: 0.4 % (ref 0.0–7.0)
HEMATOCRIT: 42.5 % (ref 38.4–49.9)
HGB: 14.2 g/dL (ref 13.0–17.1)
LYMPH#: 2.4 10*3/uL (ref 0.9–3.3)
LYMPH%: 6.9 % — AB (ref 14.0–49.0)
MCH: 35.9 pg — ABNORMAL HIGH (ref 27.2–33.4)
MCHC: 33.5 g/dL (ref 32.0–36.0)
MCV: 107.2 fL — AB (ref 79.3–98.0)
MONO#: 1.7 10*3/uL — AB (ref 0.1–0.9)
MONO%: 4.8 % (ref 0.0–14.0)
NEUT%: 87.4 % — ABNORMAL HIGH (ref 39.0–75.0)
NEUTROS ABS: 31.1 10*3/uL — AB (ref 1.5–6.5)
PLATELETS: 434 10*3/uL — AB (ref 140–400)
RBC: 3.97 10*6/uL — AB (ref 4.20–5.82)
RDW: 15.7 % — AB (ref 11.0–14.6)
WBC: 35.6 10*3/uL — AB (ref 4.0–10.3)

## 2017-04-03 LAB — LACTATE DEHYDROGENASE: LDH: 256 U/L — ABNORMAL HIGH (ref 125–245)

## 2017-04-03 NOTE — Telephone Encounter (Signed)
Appointments scheduled per 04/03/17 los. Patient was given a copy of the AVS report and appointment schedule per 04/03/17 los. °

## 2017-04-03 NOTE — Progress Notes (Signed)
[facility] [phone]   Fax:(336) [place], [place] [address]  DIAGNOSIS: Myeloproliferative disorder with positive JAK-2 mutation.  PRIOR THERAPY: None.  CURRENT THERAPY: Hydrea 500 mg by mouth daily. Status post approximately 26 months months of treatment.   INTERVAL HISTORY: Adam Tapia 83 y.o. male returns to the clinic today for three-month follow-up visit. The patient is feeling fine today with no specific complaints. He is tolerating his current treatment with hydroxyurea fairly well. He denied having any nausea or vomiting. He has no fever or chills. He denied having any recent weight loss or night sweats. The patient denied having any chest pain, shortness of breath, cough or hemoptysis. He is here today for evaluation with repeat CBC and comprehensive metabolic panel.  ALLERGIES:  is allergic to nsaids.  MEDICATIONS:  Current Outpatient Prescriptions  Medication Sig Dispense Refill  . acetaminophen (TYLENOL) 325 MG tablet Take 650 mg by mouth every 6 (six) hours as needed for moderate pain.     Marland Kitchen amLODipine (NORVASC) 5 MG tablet Take 5 mg by mouth 2 (two) times daily.     Marland Kitchen aspirin 81 MG tablet Take 81 mg by mouth daily.    Marland Kitchen atenolol (TENORMIN) 25 MG tablet Take 25 mg by mouth daily.  5  . docusate sodium (COLACE) 100 MG capsule Take 100 mg by mouth 2 (two) times daily.    Marland Kitchen esomeprazole (NEXIUM) 20 MG capsule     . fexofenadine (ALLEGRA) 180 MG tablet Take 180 mg by mouth daily.    . fluticasone (FLONASE) 50 MCG/ACT nasal spray Place 1 spray into both nostrils daily as needed for allergies.     . Homeopathic Products ([employer]) CREA     . hydroxyurea (HYDREA) 500 MG capsule TAKE 1 CAPSULE (500 MG TOTAL) BY MOUTH DAILY. MAY TAKE WITH FOOD TO MINIMIZE GI SIDE EFFECTS. 90 capsule 0  . losartan (COZAAR) 50 MG tablet Take 50 mg by mouth every morning.  1  . Protein  (PROCEL 100) POWD Take 2 scoop by mouth 2 (two) times daily.    Marland Kitchen triamterene-hydrochlorothiazide (MAXZIDE-25) 37.5-25 MG per tablet Take 0.5 tablets by mouth daily.     . TURMERIC PO Take 1 tablet by mouth daily.    Marland Kitchen ketotifen (ZADITOR) 0.025 % ophthalmic solution 1 drop 2 (two) times daily.    Marland Kitchen oxyCODONE (OXY IR/ROXICODONE) 5 MG immediate release tablet Take 1-2 tablets (5-10 mg total) by mouth every 3 (three) hours as needed for breakthrough pain. (Patient not taking: Reported on 04/03/2017) 90 tablet 0   No current facility-administered medications for this visit.     REVIEW OF SYSTEMS:  A comprehensive review of systems was negative except for: Constitutional: positive for fatigue   PHYSICAL EXAMINATION: General appearance: alert, cooperative, fatigued and no distress Head: Normocephalic, without obvious abnormality, atraumatic Neck: no adenopathy Lymph nodes: Cervical, supraclavicular, and axillary nodes normal. Resp: clear to auscultation bilaterally Back: symmetric, no curvature. ROM normal. No CVA tenderness. Cardio: regular rate and rhythm, S1, S2 normal, no murmur, click, rub or gallop GI: soft, non-tender; bowel sounds normal; no masses,  no organomegaly Extremities: extremities normal, atraumatic, no cyanosis or edema  ECOG PERFORMANCE STATUS: 1 - Symptomatic but completely ambulatory  Blood pressure (!) 129/57, pulse (!) 58, temperature 98.5 F (36.9 C), temperature source Oral, resp. rate 17, height 5\' 8"  (1.727 m), weight 189 lb 8 oz (  86 kg), SpO2 97 %.  LABORATORY DATA: Lab Results  Component Value Date   WBC 35.6 (H) 04/03/2017   HGB 14.2 04/03/2017   HCT 42.5 04/03/2017   MCV 107.2 (H) 04/03/2017   PLT 434 (H) 04/03/2017      Chemistry      Component Value Date/Time   NA 137 01/02/2017 0914   K 4.5 01/02/2017 0914   CL 104 03/12/2016 0412   CO2 25 01/02/2017 0914   BUN 28.2 (H) 01/02/2017 0914   CREATININE 1.3 01/02/2017 0914   GLU 88 03/17/2016        Component Value Date/Time   CALCIUM 10.3 01/02/2017 0914   ALKPHOS 167 (H) 01/02/2017 0914   AST 19 01/02/2017 0914   ALT 10 01/02/2017 0914   BILITOT 0.66 01/02/2017 0914       RADIOGRAPHIC STUDIES: No results found.  ASSESSMENT AND PLAN:  This is a very pleasant 83 years old African-American male with myeloproliferative disorder and persistent leukocytosis and thrombocytosis with positive JAK 2 mutation. The patient is currently on treatment with hydroxyurea and has been tolerating his treatment fairly well. His CBC today showed stable white blood count as well as platelets count compared to 3 months ago. His hemoglobin and hematocrit are within the normal range. I recommended for the patient to continue on hydroxyurea with the same dose. I will see him back for follow-up visit in 3 months for reevaluation with repeat blood work. He was advised to call immediately if he has any concerning symptoms in the interval. The patient voices understanding of current disease status and treatment options and is in agreement with the current care plan. All questions were answered. The patient knows to call the clinic with any problems, questions or concerns. We can certainly see the patient much sooner if necessary. I spent 10 minutes counseling the patient face to face. The total time spent in the appointment was 15 minutes.  Disclaimer: This note was dictated with voice recognition software. Similar sounding words can inadvertently be transcribed and may not be corrected upon review.

## 2017-07-05 ENCOUNTER — Telehealth: Payer: Self-pay | Admitting: Internal Medicine

## 2017-07-05 ENCOUNTER — Encounter: Payer: Self-pay | Admitting: Internal Medicine

## 2017-07-05 ENCOUNTER — Other Ambulatory Visit (HOSPITAL_BASED_OUTPATIENT_CLINIC_OR_DEPARTMENT_OTHER): Payer: Medicare Other

## 2017-07-05 ENCOUNTER — Ambulatory Visit (HOSPITAL_BASED_OUTPATIENT_CLINIC_OR_DEPARTMENT_OTHER): Payer: Medicare Other | Admitting: Internal Medicine

## 2017-07-05 VITALS — BP 132/64 | HR 54 | Temp 97.8°F | Resp 18 | Ht 68.0 in | Wt 188.7 lb

## 2017-07-05 DIAGNOSIS — D72829 Elevated white blood cell count, unspecified: Secondary | ICD-10-CM | POA: Diagnosis not present

## 2017-07-05 DIAGNOSIS — D471 Chronic myeloproliferative disease: Secondary | ICD-10-CM

## 2017-07-05 DIAGNOSIS — D473 Essential (hemorrhagic) thrombocythemia: Secondary | ICD-10-CM

## 2017-07-05 LAB — CBC WITH DIFFERENTIAL/PLATELET
BASO%: 0.3 % (ref 0.0–2.0)
BASOS ABS: 0.1 10*3/uL (ref 0.0–0.1)
EOS%: 0.6 % (ref 0.0–7.0)
Eosinophils Absolute: 0.1 10*3/uL (ref 0.0–0.5)
HEMATOCRIT: 43.4 % (ref 38.4–49.9)
HGB: 14.6 g/dL (ref 13.0–17.1)
LYMPH#: 2.9 10*3/uL (ref 0.9–3.3)
LYMPH%: 11.6 % — AB (ref 14.0–49.0)
MCH: 36.5 pg — AB (ref 27.2–33.4)
MCHC: 33.6 g/dL (ref 32.0–36.0)
MCV: 108.5 fL — ABNORMAL HIGH (ref 79.3–98.0)
MONO#: 2 10*3/uL — ABNORMAL HIGH (ref 0.1–0.9)
MONO%: 8 % (ref 0.0–14.0)
NEUT#: 20.2 10*3/uL — ABNORMAL HIGH (ref 1.5–6.5)
NEUT%: 79.5 % — AB (ref 39.0–75.0)
PLATELETS: 438 10*3/uL — AB (ref 140–400)
RBC: 4 10*6/uL — AB (ref 4.20–5.82)
RDW: 14.9 % — ABNORMAL HIGH (ref 11.0–14.6)
WBC: 25.4 10*3/uL — AB (ref 4.0–10.3)

## 2017-07-05 LAB — COMPREHENSIVE METABOLIC PANEL
ALT: 13 U/L (ref 0–55)
ANION GAP: 6 meq/L (ref 3–11)
AST: 19 U/L (ref 5–34)
Albumin: 3.3 g/dL — ABNORMAL LOW (ref 3.5–5.0)
Alkaline Phosphatase: 131 U/L (ref 40–150)
BUN: 21 mg/dL (ref 7.0–26.0)
CALCIUM: 10 mg/dL (ref 8.4–10.4)
CHLORIDE: 106 meq/L (ref 98–109)
CO2: 26 meq/L (ref 22–29)
Creatinine: 1.2 mg/dL (ref 0.7–1.3)
EGFR: 50 mL/min/{1.73_m2} — ABNORMAL LOW (ref >90)
Glucose: 96 mg/dl (ref 70–140)
POTASSIUM: 4.5 meq/L (ref 3.5–5.1)
Sodium: 137 mEq/L (ref 136–145)
Total Bilirubin: 0.39 mg/dL (ref 0.20–1.20)
Total Protein: 8.5 g/dL — ABNORMAL HIGH (ref 6.4–8.3)

## 2017-07-05 LAB — LACTATE DEHYDROGENASE: LDH: 192 U/L (ref 125–245)

## 2017-07-05 NOTE — Telephone Encounter (Signed)
Gave pt avs and calendar for appts.  °

## 2017-07-05 NOTE — Progress Notes (Signed)
[facility] Telephone:([phone]   Fax:(336) [place], [place] [address]  DIAGNOSIS: Myeloproliferative disorder with positive JAK-2 mutation.  PRIOR THERAPY: None.  CURRENT THERAPY: Hydrea 500 mg by mouth daily. Status post approximately 29 months months of treatment.   INTERVAL HISTORY: Adam Tapia 83 y.o. male returns to the clinic today for three-month follow-up visit. The patient is feeling fine today was no specific complaints except for mild fatigue. He has been tolerating his treatment with hydroxyurea fairly well. He denied having any chest pain but has shortness of breath with exertion was no cough or hemoptysis. He denied having any fever or chills. He has no nausea, vomiting, diarrhea or constipation. He denied having any bleeding issues. He is here today for evaluation and repeat blood work.  ALLERGIES:  is allergic to nsaids.  MEDICATIONS:  Current Outpatient Prescriptions  Medication Sig Dispense Refill  . acetaminophen (TYLENOL) 325 MG tablet Take 650 mg by mouth every 6 (six) hours as needed for moderate pain.     Marland Kitchen amLODipine (NORVASC) 5 MG tablet Take 5 mg by mouth 2 (two) times daily.     Marland Kitchen aspirin 81 MG tablet Take 81 mg by mouth daily.    Marland Kitchen atenolol (TENORMIN) 25 MG tablet Take 25 mg by mouth daily.  5  . docusate sodium (COLACE) 100 MG capsule Take 100 mg by mouth 2 (two) times daily.    Marland Kitchen esomeprazole (NEXIUM) 20 MG capsule     . fexofenadine (ALLEGRA) 180 MG tablet Take 180 mg by mouth daily.    . fluticasone (FLONASE) 50 MCG/ACT nasal spray Place 1 spray into both nostrils daily as needed for allergies.     . Homeopathic Products (ARNIC[employer]) CREA     . hydroxyurea (HYDREA) 500 MG capsule TAKE 1 CAPSULE (500 MG TOTAL) BY MOUTH DAILY. MAY TAKE WITH FOOD TO MINIMIZE GI SIDE EFFECTS. 90 capsule 0  . ketotifen (ZADITOR) 0.025 % ophthalmic solution 1 drop 2 (two)  times daily.    Marland Kitchen losartan (COZAAR) 50 MG tablet Take 50 mg by mouth every morning.  1  . oxyCODONE (OXY IR/ROXICODONE) 5 MG immediate release tablet Take 1-2 tablets (5-10 mg total) by mouth every 3 (three) hours as needed for breakthrough pain. (Patient not taking: Reported on 04/03/2017) 90 tablet 0  . Protein (PROCEL 100) POWD Take 2 scoop by mouth 2 (two) times daily.    Marland Kitchen triamterene-hydrochlorothiazide (MAXZIDE-25) 37.5-25 MG per tablet Take 0.5 tablets by mouth daily.     . TURMERIC PO Take 1 tablet by mouth daily.     No current facility-administered medications for this visit.     REVIEW OF SYSTEMS:  A comprehensive review of systems was negative except for: Constitutional: positive for fatigue   PHYSICAL EXAMINATION: General appearance: alert, cooperative, fatigued and no distress Head: Normocephalic, without obvious abnormality, atraumatic Neck: no adenopathy Lymph nodes: Cervical, supraclavicular, and axillary nodes normal. Resp: clear to auscultation bilaterally Back: symmetric, no curvature. ROM normal. No CVA tenderness. Cardio: regular rate and rhythm, S1, S2 normal, no murmur, click, rub or gallop GI: soft, non-tender; bowel sounds normal; no masses,  no organomegaly Extremities: extremities normal, atraumatic, no cyanosis or edema  ECOG PERFORMANCE STATUS: 1 - Symptomatic but completely ambulatory  Blood pressure 132/64, pulse (!) 54, temperature 97.8 F (36.6 C), temperature source Oral, resp. rate 18, height 5\' 8"  (1.727 m), weight 188  lb 11.2 oz (85.6 kg), SpO2 96 %.  LABORATORY DATA: Lab Results  Component Value Date   WBC 25.4 (H) 07/05/2017   HGB 14.6 07/05/2017   HCT 43.4 07/05/2017   MCV 108.5 (H) 07/05/2017   PLT 438 (H) 07/05/2017      Chemistry      Component Value Date/Time   NA 138 04/03/2017 0907   K 4.2 04/03/2017 0907   CL 104 03/12/2016 0412   CO2 25 04/03/2017 0907   BUN 19.6 04/03/2017 0907   CREATININE 1.2 04/03/2017 0907   GLU 88  03/17/2016      Component Value Date/Time   CALCIUM 9.6 04/03/2017 0907   ALKPHOS 165 (H) 04/03/2017 0907   AST 19 04/03/2017 0907   ALT 15 04/03/2017 0907   BILITOT 0.56 04/03/2017 0907       RADIOGRAPHIC STUDIES: No results found.  ASSESSMENT AND PLAN:  This is a very pleasant 83 years old African-American male with myeloproliferative disorder and persistent leukocytosis and thrombocytosis with positive JAK 2 mutation. The patient is currently on treatment with hydroxyurea and has been tolerating his treatment fairly well.  CBC today showed decrease in the total white blood count with normal hemoglobin and hematocrit and slightly elevated platelets count. I discussed the lab results with the patient today. I recommended for him to continue his current treatment with hydroxyurea 500 mg by mouth daily. I will see him back for follow-up visit in 3 months for reevaluation with repeat blood work. The patient voices understanding of current disease status and treatment options and is in agreement with the current care plan. All questions were answered. The patient knows to call the clinic with any problems, questions or concerns. We can certainly see the patient much sooner if necessary. I spent 10 minutes counseling the patient face to face. The total time spent in the appointment was 15 minutes.  Disclaimer: This note was dictated with voice recognition software. Similar sounding words can inadvertently be transcribed and may not be corrected upon review.

## 2017-10-05 ENCOUNTER — Other Ambulatory Visit (HOSPITAL_BASED_OUTPATIENT_CLINIC_OR_DEPARTMENT_OTHER): Payer: Medicare Other

## 2017-10-05 ENCOUNTER — Encounter: Payer: Self-pay | Admitting: Internal Medicine

## 2017-10-05 ENCOUNTER — Ambulatory Visit (HOSPITAL_BASED_OUTPATIENT_CLINIC_OR_DEPARTMENT_OTHER): Payer: Medicare Other | Admitting: Internal Medicine

## 2017-10-05 ENCOUNTER — Telehealth: Payer: Self-pay

## 2017-10-05 VITALS — BP 137/68 | HR 51 | Temp 98.0°F | Resp 18 | Ht 68.0 in | Wt 189.7 lb

## 2017-10-05 DIAGNOSIS — D72829 Elevated white blood cell count, unspecified: Secondary | ICD-10-CM

## 2017-10-05 DIAGNOSIS — D471 Chronic myeloproliferative disease: Secondary | ICD-10-CM | POA: Diagnosis not present

## 2017-10-05 DIAGNOSIS — D473 Essential (hemorrhagic) thrombocythemia: Secondary | ICD-10-CM

## 2017-10-05 LAB — CBC WITH DIFFERENTIAL/PLATELET
BASO%: 0.3 % (ref 0.0–2.0)
Basophils Absolute: 0.1 10*3/uL (ref 0.0–0.1)
EOS%: 0.5 % (ref 0.0–7.0)
Eosinophils Absolute: 0.1 10*3/uL (ref 0.0–0.5)
HCT: 46.4 % (ref 38.4–49.9)
HGB: 15.4 g/dL (ref 13.0–17.1)
LYMPH%: 8.7 % — ABNORMAL LOW (ref 14.0–49.0)
MCH: 36.3 pg — ABNORMAL HIGH (ref 27.2–33.4)
MCHC: 33.2 g/dL (ref 32.0–36.0)
MCV: 109.4 fL — ABNORMAL HIGH (ref 79.3–98.0)
MONO#: 1.9 10*3/uL — ABNORMAL HIGH (ref 0.1–0.9)
MONO%: 6.2 % (ref 0.0–14.0)
NEUT%: 84.3 % — ABNORMAL HIGH (ref 39.0–75.0)
NEUTROS ABS: 25.3 10*3/uL — AB (ref 1.5–6.5)
NRBC: 0 % (ref 0–0)
Platelets: 448 10*3/uL — ABNORMAL HIGH (ref 140–400)
RBC: 4.24 10*6/uL (ref 4.20–5.82)
RDW: 15.5 % — AB (ref 11.0–14.6)
WBC: 30 10*3/uL — AB (ref 4.0–10.3)
lymph#: 2.6 10*3/uL (ref 0.9–3.3)

## 2017-10-05 LAB — COMPREHENSIVE METABOLIC PANEL
ALT: 13 U/L (ref 0–55)
ANION GAP: 7 meq/L (ref 3–11)
AST: 18 U/L (ref 5–34)
Albumin: 3.5 g/dL (ref 3.5–5.0)
Alkaline Phosphatase: 154 U/L — ABNORMAL HIGH (ref 40–150)
BILIRUBIN TOTAL: 0.45 mg/dL (ref 0.20–1.20)
BUN: 21.8 mg/dL (ref 7.0–26.0)
CALCIUM: 9.9 mg/dL (ref 8.4–10.4)
CHLORIDE: 106 meq/L (ref 98–109)
CO2: 25 mEq/L (ref 22–29)
CREATININE: 1.3 mg/dL (ref 0.7–1.3)
EGFR: 50 mL/min/{1.73_m2} — ABNORMAL LOW (ref >60)
Glucose: 94 mg/dl (ref 70–140)
Potassium: 4.4 mEq/L (ref 3.5–5.1)
Sodium: 138 mEq/L (ref 136–145)
Total Protein: 8.9 g/dL — ABNORMAL HIGH (ref 6.4–8.3)

## 2017-10-05 LAB — LACTATE DEHYDROGENASE: LDH: 237 U/L (ref 125–245)

## 2017-10-05 NOTE — Progress Notes (Signed)
[facility] Telephone:(336) [phone]   Fax:(336) [place], [place] [address]  DIAGNOSIS: Myeloproliferative disorder with positive JAK-2 mutation.  PRIOR THERAPY: None.  CURRENT THERAPY: Hydrea 500 mg by mouth daily. Status post approximately 32 months months of treatment.  INTERVAL HISTORY: Adam Tapia 83 y.o. male returns to the clinic today for follow-up visit.  The patient is feeling fine with no specific complaints.  He denied having any significant weight loss or night sweats.  He has no bleeding issues.  He has no nausea, vomiting, diarrhea or constipation.  He denied having any chest pain, shortness of breath, cough or hemoptysis.  He continues to tolerate his treatment with hydroxyurea fairly well.  He is here today for evaluation with repeat blood work.  ALLERGIES:  is allergic to nsaids.  MEDICATIONS:  Current Outpatient Medications  Medication Sig Dispense Refill  . acetaminophen (TYLENOL) 325 MG tablet Take 650 mg by mouth every 6 (six) hours as needed for moderate pain.     Marland Kitchen amLODipine (NORVASC) 5 MG tablet Take 5 mg by mouth 2 (two) times daily.     Marland Kitchen aspirin 81 MG tablet Take 81 mg by mouth daily.    Marland Kitchen atenolol (TENORMIN) 25 MG tablet Take 25 mg by mouth daily.  5  . docusate sodium (COLACE) 100 MG capsule Take 100 mg by mouth 2 (two) times daily.    Marland Kitchen esomeprazole (NEXIUM) 20 MG capsule     . fexofenadine (ALLEGRA) 180 MG tablet Take 180 mg by mouth daily.    . fluticasone (FLONASE) 50 MCG/ACT nasal spray Place 1 spray into both nostrils daily as needed for allergies.     . Homeopathic Products (ARNIC[employer]) CREA     . hydroxyurea (HYDREA) 500 MG capsule TAKE 1 CAPSULE (500 MG TOTAL) BY MOUTH DAILY. MAY TAKE WITH FOOD TO MINIMIZE GI SIDE EFFECTS. 90 capsule 0  . ketotifen (ZADITOR) 0.025 % ophthalmic solution 1 drop 2 (two) times daily.    Marland Kitchen losartan (COZAAR) 50 MG tablet  Take 50 mg by mouth every morning.  1  . Protein (PROCEL 100) POWD Take 2 scoop by mouth 2 (two) times daily.    Marland Kitchen triamterene-hydrochlorothiazide (MAXZIDE-25) 37.5-25 MG per tablet Take 0.5 tablets by mouth daily.     . TURMERIC PO Take 1 tablet by mouth daily.    Marland Kitchen oxyCODONE (OXY IR/ROXICODONE) 5 MG immediate release tablet Take 1-2 tablets (5-10 mg total) by mouth every 3 (three) hours as needed for breakthrough pain. (Patient not taking: Reported on 04/03/2017) 90 tablet 0   No current facility-administered medications for this visit.     REVIEW OF SYSTEMS:  A comprehensive review of systems was negative except for: Constitutional: positive for fatigue   PHYSICAL EXAMINATION: General appearance: alert, cooperative, fatigued and no distress Head: Normocephalic, without obvious abnormality, atraumatic Neck: no adenopathy Lymph nodes: Cervical, supraclavicular, and axillary nodes normal. Resp: clear to auscultation bilaterally Back: symmetric, no curvature. ROM normal. No CVA tenderness. Cardio: regular rate and rhythm, S1, S2 normal, no murmur, click, rub or gallop GI: soft, non-tender; bowel sounds normal; no masses,  no organomegaly Extremities: extremities normal, atraumatic, no cyanosis or edema  ECOG PERFORMANCE STATUS: 1 - Symptomatic but completely ambulatory  Blood pressure 137/68, pulse (!) 51, temperature 98 F (36.7 C), temperature source Oral, resp. rate 18, height 5\' 8"  (1.727 m), weight 189 lb 11.2 oz (86  kg), SpO2 99 %.  LABORATORY DATA: Lab Results  Component Value Date   WBC 30.0 (H) 10/05/2017   HGB 15.4 10/05/2017   HCT 46.4 10/05/2017   MCV 109.4 (H) 10/05/2017   PLT 448 (H) 10/05/2017      Chemistry      Component Value Date/Time   NA 137 07/05/2017 0843   K 4.5 07/05/2017 0843   CL 104 03/12/2016 0412   CO2 26 07/05/2017 0843   BUN 21.0 07/05/2017 0843   CREATININE 1.2 07/05/2017 0843   GLU 88 03/17/2016      Component Value Date/Time   CALCIUM  10.0 07/05/2017 0843   ALKPHOS 131 07/05/2017 0843   AST 19 07/05/2017 0843   ALT 13 07/05/2017 0843   BILITOT 0.39 07/05/2017 0843       RADIOGRAPHIC STUDIES: No results found.  ASSESSMENT AND PLAN:  This is a very pleasant 83 years old African-American male with myeloproliferative disorder and persistent leukocytosis and thrombocytosis with positive JAK 2 mutation. The patient is currently on treatment with hydroxyurea 500 mg p.o. daily status post 32 months and has been tolerating this treatment fairly well. CBC today showed a stable white blood count, hemoglobin and hematocrit as well as platelets count. I recommended for the patient to continue with the current treatment for now. I will see him back for follow-up visit in 3 months for evaluation with repeat CBC, comprehensive metabolic panel and LDH. He was advised to call immediately if he has any concerning symptoms in the interval. The patient voices understanding of current disease status and treatment options and is in agreement with the current care plan. All questions were answered. The patient knows to call the clinic with any problems, questions or concerns. We can certainly see the patient much sooner if necessary. I spent 10 minutes counseling the patient face to face. The total time spent in the appointment was 15 minutes.  Disclaimer: This note was dictated with voice recognition software. Similar sounding words can inadvertently be transcribed and may not be corrected upon review.

## 2017-10-05 NOTE — Telephone Encounter (Signed)
Printed avs and calender for upcoming appointment. Per 11/15 los 

## 2017-12-18 ENCOUNTER — Other Ambulatory Visit: Payer: Self-pay | Admitting: Medical Oncology

## 2017-12-18 DIAGNOSIS — D471 Chronic myeloproliferative disease: Secondary | ICD-10-CM

## 2017-12-18 MED ORDER — HYDROXYUREA 500 MG PO CAPS: ORAL_CAPSULE | ORAL | 0 refills | Status: DC

## 2018-01-04 ENCOUNTER — Encounter: Payer: Self-pay | Admitting: Internal Medicine

## 2018-01-04 ENCOUNTER — Telehealth: Payer: Self-pay | Admitting: Internal Medicine

## 2018-01-04 ENCOUNTER — Inpatient Hospital Stay: Payer: Medicare Other | Attending: Internal Medicine | Admitting: Internal Medicine

## 2018-01-04 ENCOUNTER — Inpatient Hospital Stay: Payer: Medicare Other

## 2018-01-04 VITALS — BP 140/65 | HR 60 | Temp 98.0°F | Resp 17 | Ht 68.0 in | Wt 195.2 lb

## 2018-01-04 DIAGNOSIS — D471 Chronic myeloproliferative disease: Secondary | ICD-10-CM

## 2018-01-04 DIAGNOSIS — D72829 Elevated white blood cell count, unspecified: Secondary | ICD-10-CM | POA: Insufficient documentation

## 2018-01-04 DIAGNOSIS — R05 Cough: Secondary | ICD-10-CM | POA: Insufficient documentation

## 2018-01-04 DIAGNOSIS — D473 Essential (hemorrhagic) thrombocythemia: Secondary | ICD-10-CM | POA: Diagnosis not present

## 2018-01-04 LAB — COMPREHENSIVE METABOLIC PANEL
ALBUMIN: 3.4 g/dL — AB (ref 3.5–5.0)
ALT: 11 U/L (ref 0–55)
ANION GAP: 8 (ref 3–11)
AST: 17 U/L (ref 5–34)
Alkaline Phosphatase: 173 U/L — ABNORMAL HIGH (ref 40–150)
BUN: 23 mg/dL (ref 7–26)
CO2: 24 mmol/L (ref 22–29)
Calcium: 9.6 mg/dL (ref 8.4–10.4)
Chloride: 106 mmol/L (ref 98–109)
Creatinine, Ser: 1.22 mg/dL (ref 0.70–1.30)
GFR calc Af Amer: 57 mL/min — ABNORMAL LOW (ref >60)
GFR calc non Af Amer: 50 mL/min — ABNORMAL LOW (ref >60)
GLUCOSE: 91 mg/dL (ref 70–140)
POTASSIUM: 4.1 mmol/L (ref 3.5–5.1)
Sodium: 138 mmol/L (ref 136–145)
Total Bilirubin: 0.5 mg/dL (ref 0.2–1.2)
Total Protein: 8.3 g/dL (ref 6.4–8.3)

## 2018-01-04 LAB — CBC WITH DIFFERENTIAL/PLATELET
BASOS ABS: 0.1 10*3/uL (ref 0.0–0.1)
BASOS PCT: 0 %
EOS ABS: 0.2 10*3/uL (ref 0.0–0.5)
EOS PCT: 1 %
HCT: 43.5 % (ref 38.4–49.9)
Hemoglobin: 14.5 g/dL (ref 13.0–17.1)
Lymphocytes Relative: 8 %
Lymphs Abs: 3.3 10*3/uL (ref 0.9–3.3)
MCH: 36.4 pg — ABNORMAL HIGH (ref 27.2–33.4)
MCHC: 33.3 g/dL (ref 32.0–36.0)
MCV: 109.3 fL — ABNORMAL HIGH (ref 79.3–98.0)
Monocytes Absolute: 2.1 10*3/uL — ABNORMAL HIGH (ref 0.1–0.9)
Monocytes Relative: 5 %
Neutro Abs: 33.2 10*3/uL — ABNORMAL HIGH (ref 1.5–6.5)
Neutrophils Relative %: 86 %
PLATELETS: 454 10*3/uL — AB (ref 140–400)
RBC: 3.98 MIL/uL — AB (ref 4.20–5.82)
RDW: 15 % — AB (ref 11.0–14.6)
WBC: 38.9 10*3/uL — ABNORMAL HIGH (ref 4.0–10.3)
nRBC: 0 /100 WBC

## 2018-01-04 LAB — LACTATE DEHYDROGENASE: LDH: 235 U/L (ref 125–245)

## 2018-01-04 NOTE — Progress Notes (Signed)
[place] [phone]   Fax:(336) [place], [place] [address]  DIAGNOSIS: Myeloproliferative disorder with positive JAK-2 mutation.  PRIOR THERAPY: None.  CURRENT THERAPY: Hydrea 500 mg by mouth daily. Status post approximately 35 months months of treatment.  INTERVAL HISTORY: Adam Tapia 84 y.o. male returns to the clinic today for follow-up visit.  The patient is feeling fine today with no specific complaints.  He denied having any chest pain, shortness of breath but has cough and wheezing started recently.  He is taking over-the-counter cough medication as well as Allegra.  He denied having any fever or chills.  He has no nausea, vomiting, diarrhea or constipation.  He continues to tolerate his treatment with hydroxyurea fairly well.  The patient is here today for evaluation with repeat blood work.  ALLERGIES:  is allergic to nsaids.  MEDICATIONS:  Current Outpatient Medications  Medication Sig Dispense Refill  . acetaminophen (TYLENOL) 325 MG tablet Take 650 mg by mouth every 6 (six) hours as needed for moderate pain.     Marland Kitchen amLODipine (NORVASC) 5 MG tablet Take 5 mg by mouth 2 (two) times daily.     Marland Kitchen aspirin 81 MG tablet Take 81 mg by mouth daily.    Marland Kitchen atenolol (TENORMIN) 25 MG tablet Take 25 mg by mouth daily.  5  . docusate sodium (COLACE) 100 MG capsule Take 100 mg by mouth 2 (two) times daily.    Marland Kitchen esomeprazole (NEXIUM) 20 MG capsule     . fexofenadine (ALLEGRA) 180 MG tablet Take 180 mg by mouth daily.    . fluticasone (FLONASE) 50 MCG/ACT nasal spray Place 1 spray into both nostrils daily as needed for allergies.     . Homeopathic Products (ARNICARE ARNICA) CREA     . hydroxyurea (HYDREA) 500 MG capsule TAKE 1 CAPSULE (500 MG TOTAL) BY MOUTH DAILY. MAY TAKE WITH FOOD TO MINIMIZE GI SIDE EFFECTS. 90 capsule 0  . ketotifen (ZADITOR) 0.025 % ophthalmic solution 1 drop 2  (two) times daily.    Marland Kitchen losartan (COZAAR) 50 MG tablet Take 50 mg by mouth every morning.  1  . oxyCODONE (OXY IR/ROXICODONE) 5 MG immediate release tablet Take 1-2 tablets (5-10 mg total) by mouth every 3 (three) hours as needed for breakthrough pain. (Patient not taking: Reported on 04/03/2017) 90 tablet 0  . Protein (PROCEL 100) POWD Take 2 scoop by mouth 2 (two) times daily.    Marland Kitchen triamterene-hydrochlorothiazide (MAXZIDE-25) 37.5-25 MG per tablet Take 0.5 tablets by mouth daily.     . TURMERIC PO Take 1 tablet by mouth daily.     No current facility-administered medications for this visit.     REVIEW OF SYSTEMS:  A comprehensive review of systems was negative except for: Constitutional: positive for fatigue Respiratory: positive for cough and wheezing   PHYSICAL EXAMINATION: General appearance: alert, cooperative, fatigued and no distress Head: Normocephalic, without obvious abnormality, atraumatic Neck: no adenopathy Lymph nodes: Cervical, supraclavicular, and axillary nodes normal. Resp: clear to auscultation bilaterally Back: symmetric, no curvature. ROM normal. No CVA tenderness. Cardio: regular rate and rhythm, S1, S2 normal, no murmur, click, rub or gallop GI: soft, non-tender; bowel sounds normal; no masses,  no organomegaly Extremities: extremities normal, atraumatic, no cyanosis or edema  ECOG PERFORMANCE STATUS: 1 - Symptomatic but completely ambulatory  Blood pressure 140/65, pulse 60, temperature 98 F (36.7 C), temperature source Oral, resp.  rate 17, height 5\' 8"  (1.727 m), weight 195 lb 4 oz (88.6 kg), SpO2 99 %.  LABORATORY DATA: Lab Results  Component Value Date   WBC 38.9 (H) 01/04/2018   HGB 14.5 01/04/2018   HCT 43.5 01/04/2018   MCV 109.3 (H) 01/04/2018   PLT 454 (H) 01/04/2018      Chemistry      Component Value Date/Time   NA 138 10/05/2017 1018   K 4.4 10/05/2017 1018   CL 104 03/12/2016 0412   CO2 25 10/05/2017 1018   BUN 21.8 10/05/2017 1018    CREATININE 1.3 10/05/2017 1018   GLU 88 03/17/2016      Component Value Date/Time   CALCIUM 9.9 10/05/2017 1018   ALKPHOS 154 (H) 10/05/2017 1018   AST 18 10/05/2017 1018   ALT 13 10/05/2017 1018   BILITOT 0.45 10/05/2017 1018       RADIOGRAPHIC STUDIES: No results found.  ASSESSMENT AND PLAN:  This is a very pleasant 84 years old African-American male with myeloproliferative disorder and persistent leukocytosis and thrombocytosis with positive JAK 2 mutation. The patient is currently on treatment with hydroxyurea 500 mg p.o. daily status post 35 months. The patient continues to tolerate this treatment fairly well.  CBC today showed mild further increase in the total white blood count but his hemoglobin and hematocrit are stable. I recommended for the patient to continue his current treatment with hydroxyurea 500 mg p.o. Daily. I will see him back for follow-up visit in 3 months for evaluation with repeat CBC, comprehensive metabolic panel and LDH. I would consider increasing his dose of hydroxyurea if he continued to have further increase in his total white blood count. For the cough, this is likely secondary to cold symptoms and bronchitis.  He will continue with over-the-counter cough medication and would discuss with his primary care physician for additional management if needed. The patient was advised to call immediately if he has any concerning symptoms in the interval. The patient voices understanding of current disease status and treatment options and is in agreement with the current care plan. All questions were answered. The patient knows to call the clinic with any problems, questions or concerns. We can certainly see the patient much sooner if necessary. I spent 10 minutes counseling the patient face to face. The total time spent in the appointment was 15 minutes.  Disclaimer: This note was dictated with voice recognition software. Similar sounding words can inadvertently be  transcribed and may not be corrected upon review.

## 2018-01-04 NOTE — Telephone Encounter (Signed)
Scheduled appt per 2/14 los - Gave patient AVS and calender per los.  

## 2018-04-03 ENCOUNTER — Other Ambulatory Visit: Payer: Self-pay | Admitting: Medical Oncology

## 2018-04-03 ENCOUNTER — Inpatient Hospital Stay: Payer: Medicare Other

## 2018-04-03 ENCOUNTER — Inpatient Hospital Stay: Payer: Medicare Other | Attending: Internal Medicine | Admitting: Internal Medicine

## 2018-04-03 ENCOUNTER — Encounter: Payer: Self-pay | Admitting: Internal Medicine

## 2018-04-03 ENCOUNTER — Telehealth: Payer: Self-pay | Admitting: Internal Medicine

## 2018-04-03 VITALS — BP 148/74 | HR 51 | Temp 98.0°F | Resp 18 | Ht 68.0 in | Wt 194.1 lb

## 2018-04-03 DIAGNOSIS — D471 Chronic myeloproliferative disease: Secondary | ICD-10-CM | POA: Insufficient documentation

## 2018-04-03 LAB — CMP (CANCER CENTER ONLY)
ALBUMIN: 3.6 g/dL (ref 3.5–5.0)
ALK PHOS: 168 U/L — AB (ref 40–150)
ALT: 16 U/L (ref 0–55)
AST: 22 U/L (ref 5–34)
Anion gap: 7 (ref 3–11)
BILIRUBIN TOTAL: 0.3 mg/dL (ref 0.2–1.2)
BUN: 24 mg/dL (ref 7–26)
CALCIUM: 10 mg/dL (ref 8.4–10.4)
CO2: 25 mmol/L (ref 22–29)
Chloride: 106 mmol/L (ref 98–109)
Creatinine: 1.35 mg/dL — ABNORMAL HIGH (ref 0.70–1.30)
GFR, Est AFR Am: 51 mL/min — ABNORMAL LOW (ref >60)
GFR, Estimated: 44 mL/min — ABNORMAL LOW (ref >60)
GLUCOSE: 94 mg/dL (ref 70–140)
POTASSIUM: 4.4 mmol/L (ref 3.5–5.1)
Sodium: 138 mmol/L (ref 136–145)
TOTAL PROTEIN: 8.8 g/dL — AB (ref 6.4–8.3)

## 2018-04-03 LAB — CBC WITH DIFFERENTIAL (CANCER CENTER ONLY)
BASOS PCT: 0 %
Basophils Absolute: 0.1 10*3/uL (ref 0.0–0.1)
Eosinophils Absolute: 0.2 10*3/uL (ref 0.0–0.5)
Eosinophils Relative: 0 %
HEMATOCRIT: 44.1 % (ref 38.4–49.9)
Hemoglobin: 14.8 g/dL (ref 13.0–17.1)
Lymphocytes Relative: 7 %
Lymphs Abs: 2.9 10*3/uL (ref 0.9–3.3)
MCH: 36.8 pg — ABNORMAL HIGH (ref 27.2–33.4)
MCHC: 33.6 g/dL (ref 32.0–36.0)
MCV: 109.7 fL — ABNORMAL HIGH (ref 79.3–98.0)
MONO ABS: 2.1 10*3/uL — AB (ref 0.1–0.9)
MONOS PCT: 5 %
NEUTROS ABS: 35 10*3/uL — AB (ref 1.5–6.5)
Neutrophils Relative %: 88 %
Platelet Count: 479 10*3/uL — ABNORMAL HIGH (ref 140–400)
RBC: 4.02 MIL/uL — ABNORMAL LOW (ref 4.20–5.82)
RDW: 15.1 % — AB (ref 11.0–14.6)
WBC Count: 40.4 10*3/uL — ABNORMAL HIGH (ref 4.0–10.3)

## 2018-04-03 LAB — LACTATE DEHYDROGENASE: LDH: 298 U/L — ABNORMAL HIGH (ref 125–245)

## 2018-04-03 LAB — URIC ACID: Uric Acid, Serum: 10 mg/dL — ABNORMAL HIGH (ref 2.6–7.4)

## 2018-04-03 MED ORDER — INDOMETHACIN 50 MG PO CAPS: 50.0000 mg | ORAL_CAPSULE | Freq: Three times a day (TID) | ORAL | 0 refills | Status: DC | PRN

## 2018-04-03 MED ORDER — ALLOPURINOL 100 MG PO TABS: 100.0000 mg | ORAL_TABLET | Freq: Every day | ORAL | 1 refills | Status: DC

## 2018-04-03 NOTE — Progress Notes (Signed)
[facility] [phone]   Fax:(336) [place], MD [place] [address]  DIAGNOSIS: Myeloproliferative disorder with positive JAK-2 mutation.  PRIOR THERAPY: None.  CURRENT THERAPY: Hydrea 500 mg by mouth daily. Status post approximately 38 months months of treatment.  INTERVAL HISTORY: Adam Tapia 84 y.o. male returns to the clinic today for follow-up visit accompanied by his wife.  The patient is feeling fine today with no specific complaints except for mild fatigue as well as inflammation of the right big toe likely gout episode.  He denied having any current fever or chills.  He has no nausea, vomiting, diarrhea or constipation.  He denied having any chest pain, shortness of breath, cough or hemoptysis.  He has no recent weight loss or night sweats.  He continues to tolerate his treatment with Hydrea fairly well.  He is here today for evaluation and repeat blood work.  ALLERGIES:  is allergic to nsaids.  MEDICATIONS:  Current Outpatient Medications  Medication Sig Dispense Refill  . acetaminophen (TYLENOL) 325 MG tablet Take 650 mg by mouth every 6 (six) hours as needed for moderate pain.     Marland Kitchen amLODipine (NORVASC) 5 MG tablet Take 5 mg by mouth 2 (two) times daily.     Marland Kitchen aspirin 81 MG tablet Take 81 mg by mouth daily.    Marland Kitchen atenolol (TENORMIN) 25 MG tablet Take 25 mg by mouth daily.  5  . docusate sodium (COLACE) 100 MG capsule Take 100 mg by mouth 2 (two) times daily.    Marland Kitchen esomeprazole (NEXIUM) 20 MG capsule     . fexofenadine (ALLEGRA) 180 MG tablet Take 180 mg by mouth daily.    . fluticasone (FLONASE) 50 MCG/ACT nasal spray Place 1 spray into both nostrils daily as needed for allergies.     . Homeopathic Products (ARNICARE ARNICA) CREA     . hydroxyurea (HYDREA) 500 MG capsule TAKE 1 CAPSULE (500 MG TOTAL) BY MOUTH DAILY. MAY TAKE WITH FOOD TO MINIMIZE GI SIDE EFFECTS. 90 capsule 0  .  ketotifen (ZADITOR) 0.025 % ophthalmic solution 1 drop 2 (two) times daily.    Marland Kitchen losartan (COZAAR) 50 MG tablet Take 50 mg by mouth every morning.  1  . triamterene-hydrochlorothiazide (MAXZIDE-25) 37.5-25 MG per tablet Take 0.5 tablets by mouth daily.     . TURMERIC PO Take 1 tablet by mouth daily.     No current facility-administered medications for this visit.     REVIEW OF SYSTEMS:  Constitutional: positive for fatigue Eyes: negative Ears, nose, mouth, throat, and face: negative Respiratory: negative Cardiovascular: negative Gastrointestinal: negative Genitourinary:negative Integument/breast: negative Hematologic/lymphatic: negative Musculoskeletal:positive for arthralgias and Swelling and inflammation of the right big toe Neurological: negative Behavioral/Psych: negative Endocrine: negative Allergic/Immunologic: negative   PHYSICAL EXAMINATION: General appearance: alert, cooperative, fatigued and no distress Head: Normocephalic, without obvious abnormality, atraumatic Neck: no adenopathy Lymph nodes: Cervical, supraclavicular, and axillary nodes normal. Resp: clear to auscultation bilaterally Back: symmetric, no curvature. ROM normal. No CVA tenderness. Cardio: regular rate and rhythm, S1, S2 normal, no murmur, click, rub or gallop GI: soft, non-tender; bowel sounds normal; no masses,  no organomegaly Extremities: Swelling and inflammation of the right big toe Neurologic: Alert and oriented X 3, normal strength and tone. Normal symmetric reflexes. Normal coordination and gait  ECOG PERFORMANCE STATUS: 1 - Symptomatic but completely ambulatory  Blood pressure (!) 148/74, pulse (!) 51, temperature 98  F (36.7 C), temperature source Oral, resp. rate 18, height 5\' 8"  (1.727 m), weight 194 lb 1.6 oz (88 kg), SpO2 98 %.  LABORATORY DATA: Lab Results  Component Value Date   WBC 40.4 (H) 04/03/2018   HGB 14.8 04/03/2018   HCT 44.1 04/03/2018   MCV 109.7 (H) 04/03/2018   PLT  479 (H) 04/03/2018      Chemistry      Component Value Date/Time   NA 138 04/03/2018 1000   NA 138 10/05/2017 1018   K 4.4 04/03/2018 1000   K 4.4 10/05/2017 1018   CL 106 04/03/2018 1000   CO2 25 04/03/2018 1000   CO2 25 10/05/2017 1018   BUN 24 04/03/2018 1000   BUN 21.8 10/05/2017 1018   CREATININE 1.35 (H) 04/03/2018 1000   CREATININE 1.3 10/05/2017 1018   GLU 88 03/17/2016      Component Value Date/Time   CALCIUM 10.0 04/03/2018 1000   CALCIUM 9.9 10/05/2017 1018   ALKPHOS 168 (H) 04/03/2018 1000   ALKPHOS 154 (H) 10/05/2017 1018   AST 22 04/03/2018 1000   AST 18 10/05/2017 1018   ALT 16 04/03/2018 1000   ALT 13 10/05/2017 1018   BILITOT 0.3 04/03/2018 1000   BILITOT 0.45 10/05/2017 1018       RADIOGRAPHIC STUDIES: No results found.  ASSESSMENT AND PLAN:  This is a very pleasant 84 years old African-American male with myeloproliferative disorder and persistent leukocytosis and thrombocytosis with positive JAK 2 mutation. The patient is currently on treatment with hydroxyurea 500 mg p.o. daily status post 38 months. He continues to tolerate this treatment well with no concerning complaints.  His total white blood count is further elevated today.  I recommended for the patient to increase his dose of hydroxyurea to 500 mg p.o. twice daily. For the suspicious gout of the right big toe, I ordered uric acid today and it was elevated at 10.0.  I started the patient on allopurinol 100 mg p.o. twice daily.  I also started the patient on indomethacin 500 mg p.o. 3 times daily as needed for pain management. I will see the patient back for follow-up visit in 1 months for reevaluation with repeat CBC, conference metabolic panel, LDH and uric acid. The patient was advised to call immediately if he has any concerning symptoms in the interval. The patient voices understanding of current disease status and treatment options and is in agreement with the current care plan. All  questions were answered. The patient knows to call the clinic with any problems, questions or concerns. We can certainly see the patient much sooner if necessary.  Disclaimer: This note was dictated with voice recognition software. Similar sounding words can inadvertently be transcribed and may not be corrected upon review.

## 2018-04-03 NOTE — Telephone Encounter (Signed)
Appointments scheduled AVS/Calendar printed per 5/14 los °

## 2018-04-18 IMAGING — DX DG HIP (WITH OR WITHOUT PELVIS) 1V PORT*L*
2 series · 2 of 2 positions shown · non-contrast
Comparison: 03/11/2016

CLINICAL DATA: [AGE] male with a history of left hip
replacement.

EXAM:
DG HIP (WITH OR WITHOUT PELVIS) 1V PORT LEFT

[pelvis ap]
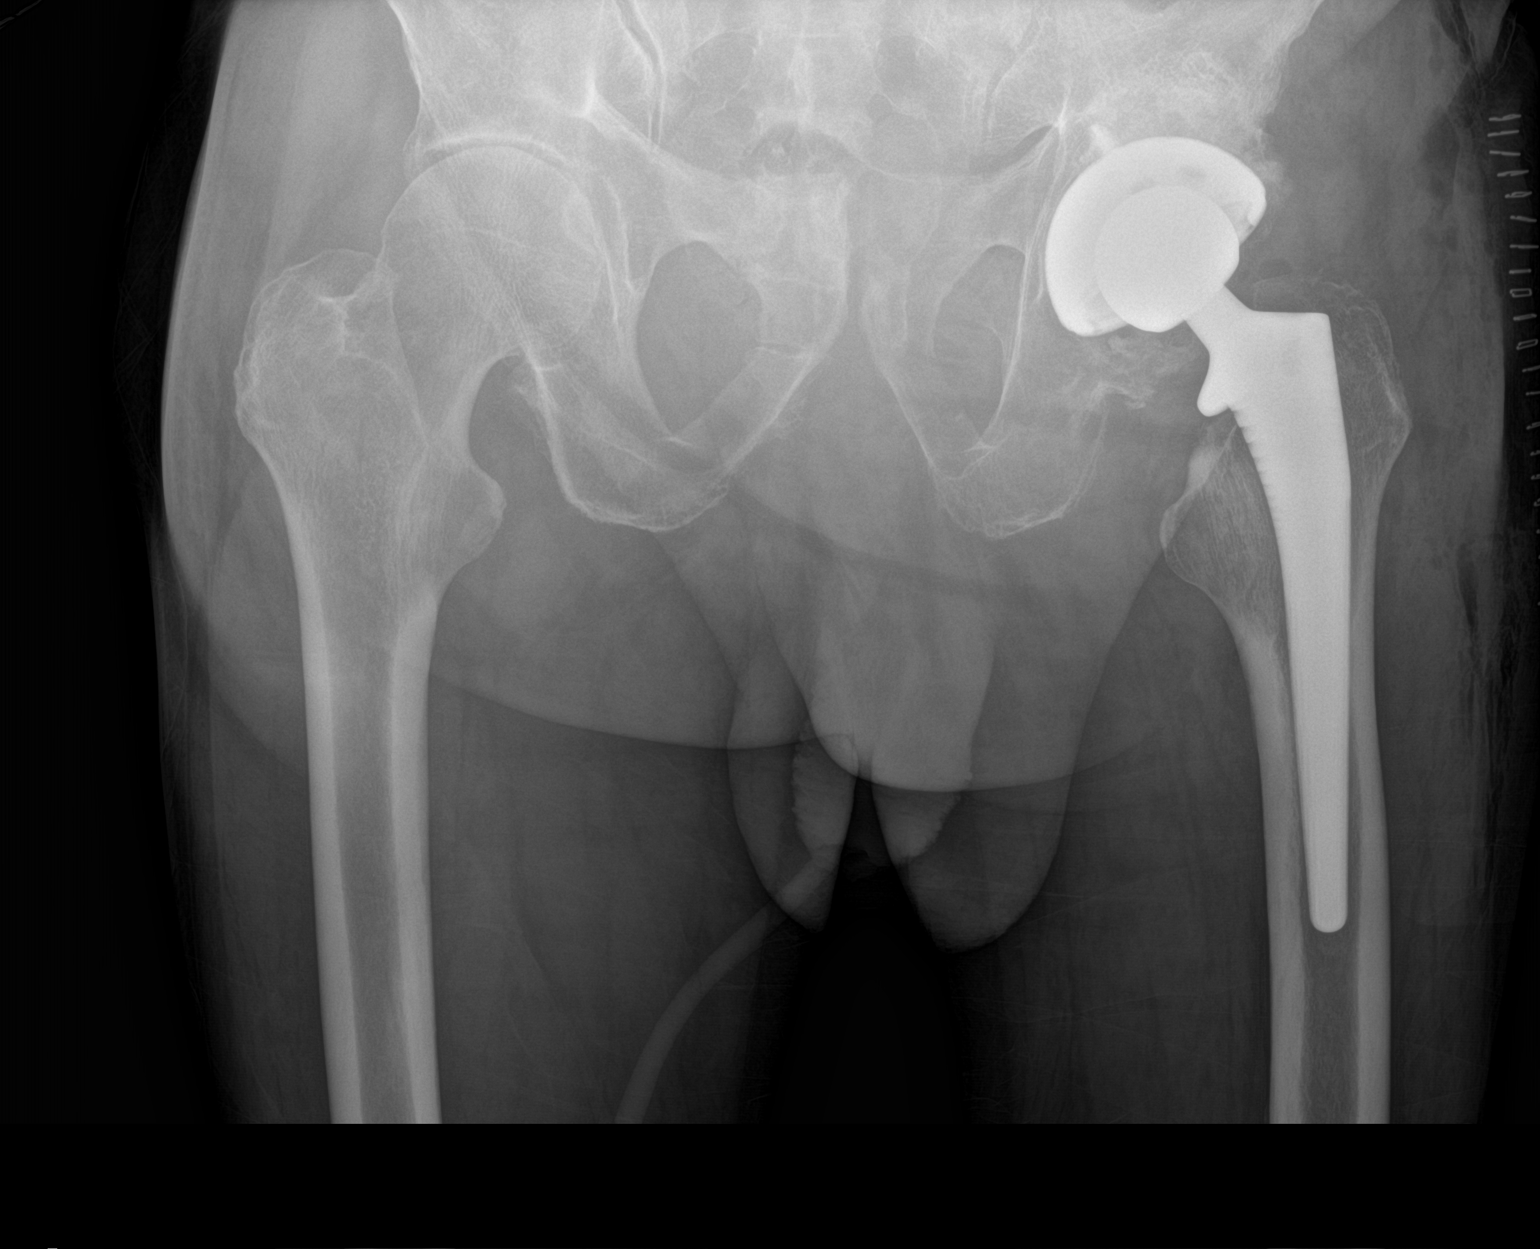

[hip ap]
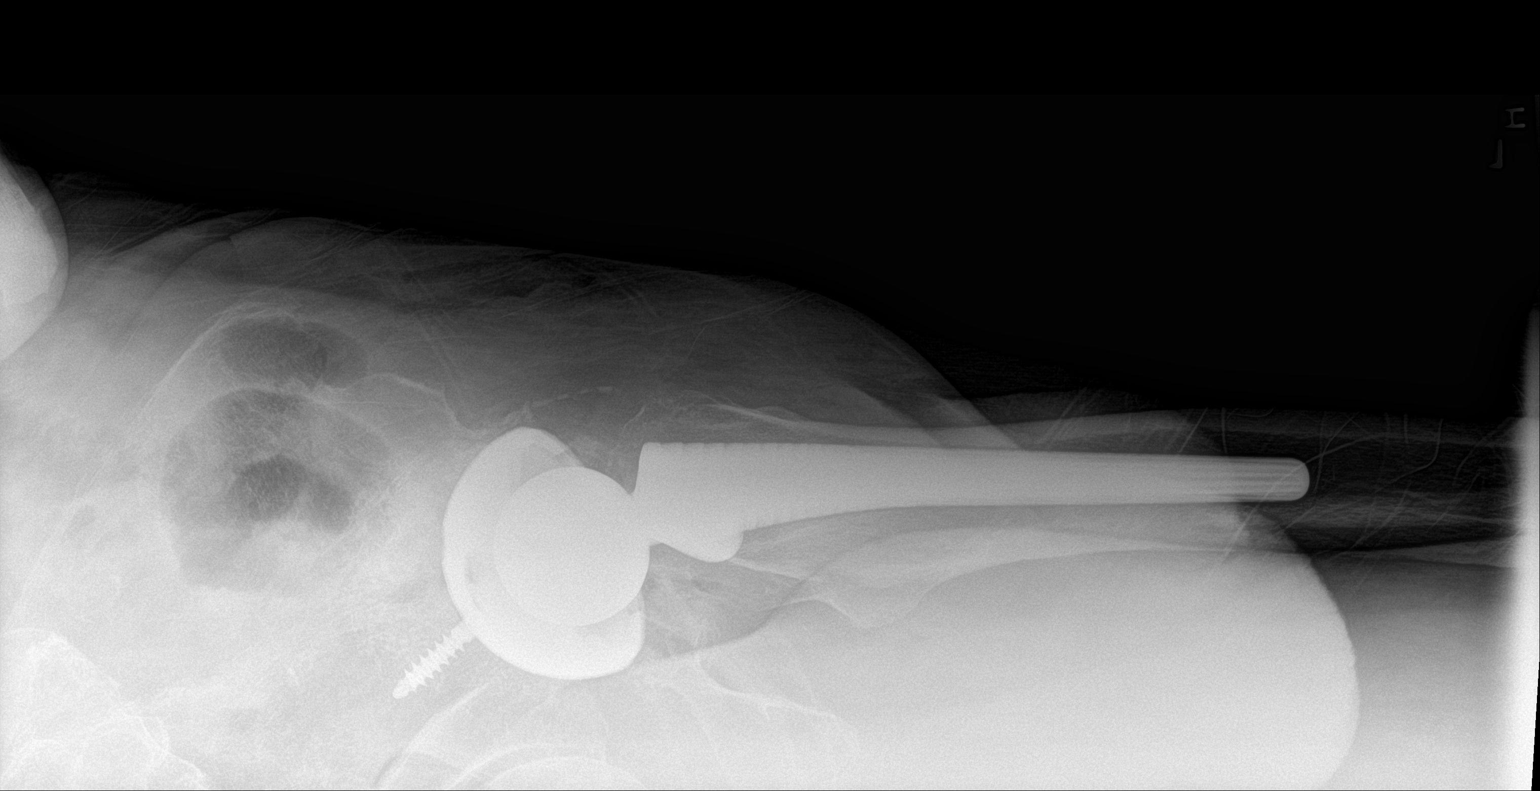

[2 of 2 positions shown; findings below may reference images not displayed]

FINDINGS: Early postoperative changes of left total hip arthroplasty. Surgical
clips project over the soft tissues of the left hip. Gas within the
surgical bed. Urinary catheter in place.

No complicating features/perihardware fracture.
IMPRESSION: Early postoperative changes of left hip arthroplasty, with no
complicating features.

## 2018-05-03 ENCOUNTER — Inpatient Hospital Stay (HOSPITAL_BASED_OUTPATIENT_CLINIC_OR_DEPARTMENT_OTHER): Payer: Medicare Other | Admitting: Oncology

## 2018-05-03 ENCOUNTER — Other Ambulatory Visit: Payer: Self-pay

## 2018-05-03 ENCOUNTER — Encounter: Payer: Self-pay | Admitting: Oncology

## 2018-05-03 ENCOUNTER — Inpatient Hospital Stay: Payer: Medicare Other | Attending: Internal Medicine

## 2018-05-03 ENCOUNTER — Telehealth: Payer: Self-pay

## 2018-05-03 VITALS — BP 128/67 | HR 60 | Temp 98.0°F | Resp 18 | Ht 68.0 in | Wt 188.5 lb

## 2018-05-03 DIAGNOSIS — D72829 Elevated white blood cell count, unspecified: Secondary | ICD-10-CM

## 2018-05-03 DIAGNOSIS — D471 Chronic myeloproliferative disease: Secondary | ICD-10-CM

## 2018-05-03 DIAGNOSIS — D473 Essential (hemorrhagic) thrombocythemia: Secondary | ICD-10-CM

## 2018-05-03 LAB — URIC ACID: URIC ACID, SERUM: 9.1 mg/dL — AB (ref 2.6–7.4)

## 2018-05-03 LAB — CBC WITH DIFFERENTIAL (CANCER CENTER ONLY)
BASOS ABS: 0.1 10*3/uL (ref 0.0–0.1)
BASOS PCT: 0 %
Eosinophils Absolute: 0.1 10*3/uL (ref 0.0–0.5)
Eosinophils Relative: 0 %
HEMATOCRIT: 42.8 % (ref 38.4–49.9)
HEMOGLOBIN: 14.6 g/dL (ref 13.0–17.1)
Lymphocytes Relative: 9 %
Lymphs Abs: 2.4 10*3/uL (ref 0.9–3.3)
MCH: 37.4 pg — ABNORMAL HIGH (ref 27.2–33.4)
MCHC: 34.1 g/dL (ref 32.0–36.0)
MCV: 109.7 fL — ABNORMAL HIGH (ref 79.3–98.0)
Monocytes Absolute: 1.7 10*3/uL — ABNORMAL HIGH (ref 0.1–0.9)
Monocytes Relative: 6 %
NEUTROS ABS: 22.8 10*3/uL — AB (ref 1.5–6.5)
NEUTROS PCT: 85 %
Platelet Count: 354 10*3/uL (ref 140–400)
RBC: 3.9 MIL/uL — AB (ref 4.20–5.82)
RDW: 16.7 % — AB (ref 11.0–14.6)
WBC: 27 10*3/uL — AB (ref 4.0–10.3)

## 2018-05-03 LAB — CMP (CANCER CENTER ONLY)
ALK PHOS: 151 U/L — AB (ref 40–150)
ALT: 8 U/L (ref 0–55)
ANION GAP: 7 (ref 3–11)
AST: 16 U/L (ref 5–34)
Albumin: 3.6 g/dL (ref 3.5–5.0)
BILIRUBIN TOTAL: 0.6 mg/dL (ref 0.2–1.2)
BUN: 45 mg/dL — ABNORMAL HIGH (ref 7–26)
CALCIUM: 9.9 mg/dL (ref 8.4–10.4)
CO2: 27 mmol/L (ref 22–29)
Chloride: 102 mmol/L (ref 98–109)
Creatinine: 1.67 mg/dL — ABNORMAL HIGH (ref 0.70–1.30)
GFR, EST AFRICAN AMERICAN: 39 mL/min — AB (ref >60)
GFR, Estimated: 34 mL/min — ABNORMAL LOW (ref >60)
Glucose, Bld: 98 mg/dL (ref 70–140)
Potassium: 4.3 mmol/L (ref 3.5–5.1)
Sodium: 136 mmol/L (ref 136–145)
TOTAL PROTEIN: 9 g/dL — AB (ref 6.4–8.3)

## 2018-05-03 LAB — LACTATE DEHYDROGENASE: LDH: 228 U/L (ref 125–245)

## 2018-05-03 MED ORDER — HYDROXYUREA 500 MG PO CAPS: 500.0000 mg | ORAL_CAPSULE | Freq: Two times a day (BID) | ORAL | 0 refills | Status: DC

## 2018-05-03 NOTE — Assessment & Plan Note (Addendum)
This is a very pleasant 84 year old African-American male with myeloproliferative disorder and persistent leukocytosis and thrombocytosis with positive JAK 2 mutation. The patient received treatment with hydroxyurea 500 mg p.o. daily status post 38 months.  His white blood cell count was rising and his dose was increased to 500 mg twice a day.  Status post 1 month of the increased dose. He is tolerating his treatment fairly well with no concerning complaints.  Patient was seen with Dr. Julien Nordmann.  Labs were reviewed with the patient.  White count is coming down and his hemoglobin and platelets remain normal.  Recommend he continue hydroxyurea 500 mg twice a day. Uric acid level is declining but still remains elevated.  He will continue on allopurinol 100 mg twice a day.  He was given written information about gout and foods to avoid.  His BUN and creatinine are slightly elevated today.  He was encouraged to increase his water intake. The patient will follow-up in approximately 1 month with a CBC, CMET, LDH, and uric acid.  The patient was advised to call immediately if he has any concerning symptoms in the interval. The patient voices understanding of current disease status and treatment options and is in agreement with the current care plan. All questions were answered. The patient knows to call the clinic with any problems, questions or concerns. We can certainly see the patient much sooner if necessary.

## 2018-05-03 NOTE — Patient Instructions (Signed)

## 2018-05-03 NOTE — Telephone Encounter (Signed)
Printed avs and calender of upcoming appointment. Per 6/13 los 

## 2018-05-03 NOTE — Progress Notes (Signed)
[facility] OFFICE PROGRESS NOTE  [name], MD [place] [address]  DIAGNOSIS: Myeloproliferative disorder with positive JAK-2 mutation.  PRIOR THERAPY: None  CURRENT THERAPY: Hydrea 500 mg by mouth daily. Status post approximately 38 months months of treatment.  The dose of Hydrea was increased to 500 mg twice a day on 04/03/2018.  Status post 1 month of this increased dose.  INTERVAL HISTORY: Adam Tapia 84 y.o. male returns for routine follow-up visit by himself.  The patient is feeling fine today and has no specific except for mild fatigue.  His gout symptoms have resolved.  Mains on allopurinol 100 mg twice a day.  He denies fevers and chills.  Denies chest pain, shortness breath, cough, hemoptysis.  Denies nausea, vomiting, constipation, diarrhea.  He has had recent weight loss but reports he has been trying to lose weight.  Denies night sweats.  He continues to tolerate treatment with Hydrea fairly well.  The patient is here for evaluation and repeat lab work.  MEDICAL HISTORY: Past Medical History:  Diagnosis Date  . Acute blood loss anemia   . AF (atrial fibrillation) ([place])   . Allergic rhinitis   . Anemia   . Arthritis   . BPH (benign prostatic hyperplasia)   . Constipation   . Dysrhythmia    PT STATES HAS HX A FIB - FOLLOWED BY DR.KEVIN LITTLE  . GERD (gastroesophageal reflux disease)    OCCASIONAL - HAS NEXIUM IF NEEDED - NOT CURRENTLY TAKING  . Hypertension   . Myeloproliferative disorder ([place])    FOLLOWED BY DR. Julien Nordmann  AT [place]  . Nocturia   . Primary osteoarthritis of left hip   . Psoriasis   . Unsteady gait     ALLERGIES:  is allergic to nsaids.  MEDICATIONS:  Current Outpatient Medications  Medication Sig Dispense Refill  . acetaminophen (TYLENOL) 325 MG tablet Take 650 mg by mouth every 6 (six) hours as needed for moderate pain.     Marland Kitchen allopurinol (ZYLOPRIM) 100 MG tablet Take 1 tablet (100 mg total)  by mouth daily. 60 tablet 1  . amLODipine (NORVASC) 5 MG tablet Take 5 mg by mouth 2 (two) times daily.     Marland Kitchen atenolol (TENORMIN) 25 MG tablet Take 25 mg by mouth daily.  5  . docusate sodium (COLACE) 100 MG capsule Take 100 mg by mouth 2 (two) times daily.    . fexofenadine (ALLEGRA) 180 MG tablet Take 180 mg by mouth daily.    . fluticasone (FLONASE) 50 MCG/ACT nasal spray Place 1 spray into both nostrils daily as needed for allergies.     . Homeopathic Products (ARNICARE ARNICA) CREA     . hydroxyurea (HYDREA) 500 MG capsule Take 1 capsule (500 mg total) by mouth 2 (two) times daily. TAKE 1 CAPSULE (500 MG TOTAL) BY MOUTH DAILY. MAY TAKE WITH FOOD TO MINIMIZE GI SIDE EFFECTS. 180 capsule 0  . indomethacin (INDOCIN) 50 MG capsule Take 1 capsule (50 mg total) by mouth 3 (three) times daily as needed. 21 capsule 0  . ketotifen (ZADITOR) 0.025 % ophthalmic solution 1 drop 2 (two) times daily.    Marland Kitchen losartan (COZAAR) 50 MG tablet Take 50 mg by mouth every morning.  1  . triamterene-hydrochlorothiazide (MAXZIDE-25) 37.5-25 MG per tablet Take 0.5 tablets by mouth daily.     . TURMERIC PO Take 1 tablet by mouth daily.    Marland Kitchen aspirin 81 MG tablet Take 81 mg  by mouth daily.    Marland Kitchen esomeprazole (NEXIUM) 20 MG capsule      No current facility-administered medications for this visit.     SURGICAL HISTORY:  Past Surgical History:  Procedure Laterality Date  . HERNIA REPAIR  1998  . TOTAL HIP ARTHROPLASTY Left 03/11/2016   Procedure: LEFT TOTAL HIP ARTHROPLASTY ANTERIOR APPROACH;  Surgeon: Mcarthur Rossetti, MD;  Location: [facility];  Service: Orthopedics;  Laterality: Left;  Went from Spinal to an LMA    REVIEW OF SYSTEMS:   Review of Systems  Constitutional: Negative for appetite change, chills, and fever.  Positive for mild fatigue and weight loss.  HENT:   Negative for mouth sores, nosebleeds, sore throat and trouble swallowing.   Eyes: Negative for eye problems and icterus.  Respiratory:  Negative for cough, hemoptysis, shortness of breath and wheezing.   Cardiovascular: Negative for chest pain and leg swelling.  Gastrointestinal: Negative for abdominal pain, constipation, diarrhea, nausea and vomiting.  Genitourinary: Negative for bladder incontinence, difficulty urinating, dysuria, frequency and hematuria.   Musculoskeletal: Negative for back pain, gait problem, neck pain and neck stiffness.  Skin: Negative for itching and rash.  Neurological: Negative for dizziness, extremity weakness, gait problem, headaches, light-headedness and seizures.  Hematological: Negative for adenopathy. Does not bruise/bleed easily.  Psychiatric/Behavioral: Negative for confusion, depression and sleep disturbance. The patient is not nervous/anxious.     PHYSICAL EXAMINATION:  Blood pressure 128/67, pulse 60, temperature 98 F (36.7 C), temperature source Oral, resp. rate 18, height 5\' 8"  (1.727 m), weight 188 lb 8 oz (85.5 kg), SpO2 96 %.  ECOG PERFORMANCE STATUS: 1 - Symptomatic but completely ambulatory  Physical Exam  Constitutional: Oriented to person, place, and time and well-developed, well-nourished, and in no distress. No distress.  HENT:  Head: Normocephalic and atraumatic.  Mouth/Throat: Oropharynx is clear and moist. No oropharyngeal exudate.  Eyes: Conjunctivae are normal. Right eye exhibits no discharge. Left eye exhibits no discharge. No scleral icterus.  Neck: Normal range of motion. Neck supple.  Cardiovascular: Normal rate, regular rhythm, normal heart sounds and intact distal pulses.   Pulmonary/Chest: Effort normal and breath sounds normal. No respiratory distress. No wheezes. No rales.  Abdominal: Soft. Bowel sounds are normal. Exhibits no distension and no mass. There is no tenderness.  Musculoskeletal: Normal range of motion. Exhibits no edema.  Lymphadenopathy:    No cervical adenopathy.  Neurological: Alert and oriented to person, place, and time. Exhibits normal  muscle tone. Gait normal. Coordination normal.  Skin: Skin is warm and dry. No rash noted. Not diaphoretic. No erythema. No pallor.  Psychiatric: Mood, memory and judgment normal.  Vitals reviewed.  LABORATORY DATA: Lab Results  Component Value Date   WBC 27.0 (H) 05/03/2018   HGB 14.6 05/03/2018   HCT 42.8 05/03/2018   MCV 109.7 (H) 05/03/2018   PLT 354 05/03/2018      Chemistry      Component Value Date/Time   NA 136 05/03/2018 0934   NA 138 10/05/2017 1018   K 4.3 05/03/2018 0934   K 4.4 10/05/2017 1018   CL 102 05/03/2018 0934   CO2 27 05/03/2018 0934   CO2 25 10/05/2017 1018   BUN 45 (H) 05/03/2018 0934   BUN 21.8 10/05/2017 1018   CREATININE 1.67 (H) 05/03/2018 0934   CREATININE 1.3 10/05/2017 1018   GLU 88 03/17/2016      Component Value Date/Time   CALCIUM 9.9 05/03/2018 0934   CALCIUM 9.9 10/05/2017 1018  ALKPHOS 151 (H) 05/03/2018 0934   ALKPHOS 154 (H) 10/05/2017 1018   AST 16 05/03/2018 0934   AST 18 10/05/2017 1018   ALT 8 05/03/2018 0934   ALT 13 10/05/2017 1018   BILITOT 0.6 05/03/2018 0934   BILITOT 0.45 10/05/2017 1018       RADIOGRAPHIC STUDIES:  No results found.   ASSESSMENT/PLAN:  Myeloproliferative disorder This is a very pleasant 84 year old African-American male with myeloproliferative disorder and persistent leukocytosis and thrombocytosis with positive JAK 2 mutation. The patient received treatment with hydroxyurea 500 mg p.o. daily status post 38 months.  His white blood cell count was rising and his dose was increased to 500 mg twice a day.  Status post 1 month of the increased dose. He is tolerating his treatment fairly well with no concerning complaints.  Patient was seen with Dr. Julien Nordmann.  Labs were reviewed with the patient.  White count is coming down and his hemoglobin and platelets remain normal.  Recommend he continue hydroxyurea 500 mg twice a day. Uric acid level is declining but still remains elevated.  He will continue  on allopurinol 100 mg twice a day.  He was given written information about gout and foods to avoid.  His BUN and creatinine are slightly elevated today.  He was encouraged to increase his water intake. The patient will follow-up in approximately 1 month with a CBC, CMET, LDH, and uric acid.  The patient was advised to call immediately if he has any concerning symptoms in the interval. The patient voices understanding of current disease status and treatment options and is in agreement with the current care plan. All questions were answered. The patient knows to call the clinic with any problems, questions or concerns. We can certainly see the patient much sooner if necessary.   Orders Placed This Encounter  Procedures  . CBC with Differential (Cancer Center Only)    Standing Status:   Future    Standing Expiration Date:   05/04/2019  . CMP ([facility] only)    Standing Status:   Future    Standing Expiration Date:   05/04/2019  . Uric acid    Standing Status:   Future    Standing Expiration Date:   05/03/2019  . Lactate dehydrogenase    Standing Status:   Future    Standing Expiration Date:   05/04/2019   Mikey Bussing, DNP, AGPCNP-BC, AOCNP 05/03/18  ADDENDUM: Hematology/Oncology Attending: I had a face-to-face encounter with the patient.  I recommended his care plan.  This is a very pleasant 84 years old African-American male with myeloproliferative disorder, essential thrombocythemia as well as leukocytosis.  The patient is currently on treatment with hydroxyurea and his dose has been increased in months ago to 500 mg p.o. twice daily.  He is tolerating it well with no concerning complaints.  CBC today showed improvement in his condition with decrease in the total white blood count as well as decrease in the platelets count.  The patient is also on treatment with allopurinol for gout. He is doing well with this regimen. I recommended for the patient to continue his current treatment  with hydroxyurea with the same dose 500 mg p.o. twice daily. We will see him back for follow-up visit in 1 month for evaluation and repeat blood work. He was advised to call immediately if he has any concerning symptoms in the interval.  Disclaimer: This note was dictated with voice recognition software. Similar sounding words can inadvertently be  transcribed and may be missed upon review. Eilleen Kempf, MD 05/05/18

## 2018-05-30 ENCOUNTER — Encounter: Payer: Self-pay | Admitting: Oncology

## 2018-05-30 ENCOUNTER — Inpatient Hospital Stay: Payer: Medicare Other

## 2018-05-30 ENCOUNTER — Telehealth: Payer: Self-pay | Admitting: Internal Medicine

## 2018-05-30 ENCOUNTER — Inpatient Hospital Stay: Payer: Medicare Other | Attending: Internal Medicine | Admitting: Oncology

## 2018-05-30 VITALS — BP 122/68 | HR 52 | Temp 98.0°F | Resp 18 | Ht 68.0 in | Wt 193.0 lb

## 2018-05-30 DIAGNOSIS — D72829 Elevated white blood cell count, unspecified: Secondary | ICD-10-CM | POA: Insufficient documentation

## 2018-05-30 DIAGNOSIS — Z79899 Other long term (current) drug therapy: Secondary | ICD-10-CM | POA: Insufficient documentation

## 2018-05-30 DIAGNOSIS — D473 Essential (hemorrhagic) thrombocythemia: Secondary | ICD-10-CM | POA: Diagnosis not present

## 2018-05-30 DIAGNOSIS — D471 Chronic myeloproliferative disease: Secondary | ICD-10-CM

## 2018-05-30 DIAGNOSIS — R7989 Other specified abnormal findings of blood chemistry: Secondary | ICD-10-CM | POA: Insufficient documentation

## 2018-05-30 LAB — CBC WITH DIFFERENTIAL (CANCER CENTER ONLY)
BASOS ABS: 0.1 10*3/uL (ref 0.0–0.1)
BASOS PCT: 0 %
EOS ABS: 0.1 10*3/uL (ref 0.0–0.5)
Eosinophils Relative: 0 %
HCT: 37.3 % — ABNORMAL LOW (ref 38.4–49.9)
HEMOGLOBIN: 12.9 g/dL — AB (ref 13.0–17.1)
LYMPHS ABS: 2.9 10*3/uL (ref 0.9–3.3)
Lymphocytes Relative: 16 %
MCH: 39.2 pg — AB (ref 27.2–33.4)
MCHC: 34.6 g/dL (ref 32.0–36.0)
MCV: 113.4 fL — ABNORMAL HIGH (ref 79.3–98.0)
Monocytes Absolute: 1.5 10*3/uL — ABNORMAL HIGH (ref 0.1–0.9)
Monocytes Relative: 8 %
NEUTROS PCT: 76 %
Neutro Abs: 14 10*3/uL — ABNORMAL HIGH (ref 1.5–6.5)
PLATELETS: 307 10*3/uL (ref 140–400)
RBC: 3.29 MIL/uL — AB (ref 4.20–5.82)
RDW: 19.2 % — ABNORMAL HIGH (ref 11.0–14.6)
WBC Count: 18.6 10*3/uL — ABNORMAL HIGH (ref 4.0–10.3)

## 2018-05-30 LAB — URIC ACID: URIC ACID, SERUM: 7.7 mg/dL (ref 3.7–8.6)

## 2018-05-30 LAB — CMP (CANCER CENTER ONLY)
ALT: 14 U/L (ref 0–44)
AST: 20 U/L (ref 15–41)
Albumin: 3.7 g/dL (ref 3.5–5.0)
Alkaline Phosphatase: 122 U/L (ref 38–126)
Anion gap: 6 (ref 5–15)
BILIRUBIN TOTAL: 0.5 mg/dL (ref 0.3–1.2)
BUN: 36 mg/dL — ABNORMAL HIGH (ref 8–23)
CHLORIDE: 104 mmol/L (ref 98–111)
CO2: 25 mmol/L (ref 22–32)
CREATININE: 1.64 mg/dL — AB (ref 0.61–1.24)
Calcium: 9.5 mg/dL (ref 8.9–10.3)
GFR, EST AFRICAN AMERICAN: 40 mL/min — AB (ref >60)
GFR, Estimated: 34 mL/min — ABNORMAL LOW (ref >60)
Glucose, Bld: 84 mg/dL (ref 70–99)
POTASSIUM: 4.6 mmol/L (ref 3.5–5.1)
Sodium: 135 mmol/L (ref 135–145)
TOTAL PROTEIN: 8.4 g/dL — AB (ref 6.5–8.1)

## 2018-05-30 LAB — LACTATE DEHYDROGENASE: LDH: 212 U/L — AB (ref 98–192)

## 2018-05-30 NOTE — Assessment & Plan Note (Addendum)
This is a very pleasant 84 year old African-American male with myeloproliferative disorder and persistent leukocytosis and thrombocytosis with positive JAK 2 mutation. The patient received treatment with hydroxyurea 500 mg p.o. daily status post 34months.  His white blood cell count was rising and his dose was increased to 500 mg twice a day.  Status post 2 months of the increased dose. He is tolerating his treatment fairly well with no concerning complaints. Labs reviewed with Dr. Julien Nordmann.  Recommend that he continue on Hydrea 500 mg twice a day.  BUN and creatinine remain slightly elevated but improved since last check.  He was again encouraged to increase his water intake. The patient will return in 3 months for evaluation and repeat lab work.  The patient was advised to call immediately if he has any concerning symptoms in the interval. The patient voices understanding of current disease status and treatment options and is in agreement with the current care plan. All questions were answered. The patient knows to call the clinic with any problems, questions or concerns. We can certainly see the patient much sooner if necessary.

## 2018-05-30 NOTE — Telephone Encounter (Signed)
Appointments scheduled AVS/Calendar printed per 7/10 los °

## 2018-05-30 NOTE — Progress Notes (Signed)
[facility] OFFICE PROGRESS NOTE  Adam Fess, MD [place] [address]  DIAGNOSIS: Myeloproliferative disorder with positive JAK-2 mutation.  PRIOR THERAPY: None  CURRENT THERAPY: Hydrea 500 mg by mouth daily. Status post approximately 56months months of treatment.  The dose of Hydrea was increased to 500 mg twice a day on 04/03/2018.  Status post 2 months of this increased dose.  INTERVAL HISTORY: Adam Tapia 84 y.o. male returns for routine follow-up visit by himself.  The patient is feeling fine today and has no specific complaints.  He continues to tolerate treatment with Hydrea fairly well.  He remains on this medication at 500 mg twice a day.  He denies fevers and chills.  Denies chest pain, shortness of breath, cough, hemoptysis.  Denies nausea, vomiting, constipation, diarrhea.  He denies any recent weight loss or night sweats.  The patient is here for evaluation and repeat lab work.  MEDICAL HISTORY: Past Medical History:  Diagnosis Date  . Acute blood loss anemia   . AF (atrial fibrillation) ([place])   . Allergic rhinitis   . Anemia   . Arthritis   . BPH (benign prostatic hyperplasia)   . Constipation   . Dysrhythmia    PT STATES HAS HX A FIB - FOLLOWED BY DR.KEVIN LITTLE  . GERD (gastroesophageal reflux disease)    OCCASIONAL - HAS NEXIUM IF NEEDED - NOT CURRENTLY TAKING  . Hypertension   . Myeloproliferative disorder ([place])    FOLLOWED BY DR. Julien Nordmann  AT [place]  . Nocturia   . Primary osteoarthritis of left hip   . Psoriasis   . Unsteady gait     ALLERGIES:  is allergic to nsaids.  MEDICATIONS:  Current Outpatient Medications  Medication Sig Dispense Refill  . acetaminophen (TYLENOL) 325 MG tablet Take 650 mg by mouth every 6 (six) hours as needed for moderate pain.     Marland Kitchen allopurinol (ZYLOPRIM) 100 MG tablet Take 1 tablet (100 mg total) by mouth daily. 60 tablet 1  . amLODipine (NORVASC) 5 MG tablet Take 5 mg by  mouth 2 (two) times daily.     Marland Kitchen aspirin 81 MG tablet Take 81 mg by mouth daily.    Marland Kitchen atenolol (TENORMIN) 25 MG tablet Take 25 mg by mouth daily.  5  . docusate sodium (COLACE) 100 MG capsule Take 100 mg by mouth 2 (two) times daily.    Marland Kitchen esomeprazole (NEXIUM) 20 MG capsule     . fexofenadine (ALLEGRA) 180 MG tablet Take 180 mg by mouth daily.    . fluticasone (FLONASE) 50 MCG/ACT nasal spray Place 1 spray into both nostrils daily as needed for allergies.     . Homeopathic Products (ARNICARE ARNICA) CREA     . hydroxyurea (HYDREA) 500 MG capsule Take 1 capsule (500 mg total) by mouth 2 (two) times daily. TAKE 1 CAPSULE (500 MG TOTAL) BY MOUTH DAILY. MAY TAKE WITH FOOD TO MINIMIZE GI SIDE EFFECTS. 180 capsule 0  . indomethacin (INDOCIN) 50 MG capsule Take 1 capsule (50 mg total) by mouth 3 (three) times daily as needed. 21 capsule 0  . ketotifen (ZADITOR) 0.025 % ophthalmic solution 1 drop 2 (two) times daily.    Marland Kitchen losartan (COZAAR) 50 MG tablet Take 50 mg by mouth every morning.  1  . triamterene-hydrochlorothiazide (MAXZIDE-25) 37.5-25 MG per tablet Take 0.5 tablets by mouth daily.     . TURMERIC PO Take 1 tablet by mouth daily.  No current facility-administered medications for this visit.     SURGICAL HISTORY:  Past Surgical History:  Procedure Laterality Date  . HERNIA REPAIR  1998  . TOTAL HIP ARTHROPLASTY Left 03/11/2016   Procedure: LEFT TOTAL HIP ARTHROPLASTY ANTERIOR APPROACH;  Surgeon: Mcarthur Rossetti, MD;  Location: [facility];  Service: Orthopedics;  Laterality: Left;  Went from Spinal to an LMA    REVIEW OF SYSTEMS:   Review of Systems  Constitutional: Negative for appetite change, chills, fever and unexpected weight change.  HENT:   Negative for mouth sores, nosebleeds, sore throat and trouble swallowing.   Eyes: Negative for eye problems and icterus.  Respiratory: Negative for cough, hemoptysis, shortness of breath and wheezing.   Cardiovascular: Negative for chest  pain and leg swelling.  Gastrointestinal: Negative for abdominal pain, constipation, diarrhea, nausea and vomiting.  Genitourinary: Negative for bladder incontinence, difficulty urinating, dysuria, frequency and hematuria.   Musculoskeletal: Negative for back pain, gait problem, neck pain and neck stiffness.  Skin: Negative for itching and rash.  Neurological: Negative for dizziness, extremity weakness, gait problem, headaches, light-headedness and seizures.  Hematological: Negative for adenopathy. Does not bruise/bleed easily.  Psychiatric/Behavioral: Negative for confusion, depression and sleep disturbance. The patient is not nervous/anxious.     PHYSICAL EXAMINATION:  Blood pressure 122/68, pulse (!) 52, temperature 98 F (36.7 C), resp. rate 18, height 5\' 8"  (1.727 m), weight 193 lb (87.5 kg), SpO2 99 %.  ECOG PERFORMANCE STATUS: 1 - Symptomatic but completely ambulatory  Physical Exam  Constitutional: Oriented to person, place, and time and well-developed, well-nourished, and in no distress. No distress.  HENT:  Head: Normocephalic and atraumatic.  Mouth/Throat: Oropharynx is clear and moist. No oropharyngeal exudate.  Eyes: Conjunctivae are normal. Right eye exhibits no discharge. Left eye exhibits no discharge. No scleral icterus.  Neck: Normal range of motion. Neck supple.  Cardiovascular: Normal rate, regular rhythm, normal heart sounds and intact distal pulses.   Pulmonary/Chest: Effort normal and breath sounds normal. No respiratory distress. No wheezes. No rales.  Abdominal: Soft. Bowel sounds are normal. Exhibits no distension and no mass. There is no tenderness.  Musculoskeletal: Normal range of motion. Exhibits no edema.  Lymphadenopathy:    No cervical adenopathy.  Neurological: Alert and oriented to person, place, and time. Exhibits normal muscle tone. Gait normal. Coordination normal.  Skin: Skin is warm and dry. No rash noted. Not diaphoretic. No erythema. No pallor.   Psychiatric: Mood, memory and judgment normal.  Vitals reviewed.  LABORATORY DATA: Lab Results  Component Value Date   WBC 18.6 (H) 05/30/2018   HGB 12.9 (L) 05/30/2018   HCT 37.3 (L) 05/30/2018   MCV 113.4 (H) 05/30/2018   PLT 307 05/30/2018      Chemistry      Component Value Date/Time   NA 135 05/30/2018 1441   NA 138 10/05/2017 1018   K 4.6 05/30/2018 1441   K 4.4 10/05/2017 1018   CL 104 05/30/2018 1441   CO2 25 05/30/2018 1441   CO2 25 10/05/2017 1018   BUN 36 (H) 05/30/2018 1441   BUN 21.8 10/05/2017 1018   CREATININE 1.64 (H) 05/30/2018 1441   CREATININE 1.3 10/05/2017 1018   GLU 88 03/17/2016      Component Value Date/Time   CALCIUM 9.5 05/30/2018 1441   CALCIUM 9.9 10/05/2017 1018   ALKPHOS 122 05/30/2018 1441   ALKPHOS 154 (H) 10/05/2017 1018   AST 20 05/30/2018 1441   AST 18 10/05/2017 1018  ALT 14 05/30/2018 1441   ALT 13 10/05/2017 1018   BILITOT 0.5 05/30/2018 1441   BILITOT 0.45 10/05/2017 1018       RADIOGRAPHIC STUDIES:  No results found.   ASSESSMENT/PLAN:  Myeloproliferative disorder This is a very pleasant 84 year old African-American male with myeloproliferative disorder and persistent leukocytosis and thrombocytosis with positive JAK 2 mutation. The patient received treatment with hydroxyurea 500 mg p.o. daily status post 79months.  His white blood cell count was rising and his dose was increased to 500 mg twice a day.  Status post 2 months of the increased dose. He is tolerating his treatment fairly well with no concerning complaints. Labs reviewed with Dr. Julien Nordmann.  Recommend that he continue on Hydrea 500 mg twice a day.  BUN and creatinine remain slightly elevated but improved since last check.  He was again encouraged to increase his water intake. The patient will return in 3 months for evaluation and repeat lab work.  The patient was advised to call immediately if he has any concerning symptoms in the interval. The patient  voices understanding of current disease status and treatment options and is in agreement with the current care plan. All questions were answered. The patient knows to call the clinic with any problems, questions or concerns. We can certainly see the patient much sooner if necessary.   Orders Placed This Encounter  Procedures  . CBC with Differential (Cancer Center Only)    Standing Status:   Future    Standing Expiration Date:   05/31/2019  . CMP ([facility] only)    Standing Status:   Future    Standing Expiration Date:   05/31/2019  . Uric acid    Standing Status:   Future    Standing Expiration Date:   05/30/2019  . Lactate dehydrogenase    Standing Status:   Future    Standing Expiration Date:   05/31/2019   Mikey Bussing, DNP, AGPCNP-BC, AOCNP 05/30/18

## 2018-06-25 ENCOUNTER — Ambulatory Visit (INDEPENDENT_AMBULATORY_CARE_PROVIDER_SITE_OTHER): Payer: Medicare Other | Admitting: Physician Assistant

## 2018-06-25 ENCOUNTER — Ambulatory Visit (INDEPENDENT_AMBULATORY_CARE_PROVIDER_SITE_OTHER): Payer: Self-pay

## 2018-06-25 ENCOUNTER — Encounter (INDEPENDENT_AMBULATORY_CARE_PROVIDER_SITE_OTHER): Payer: Self-pay | Admitting: Physician Assistant

## 2018-06-25 DIAGNOSIS — Z96642 Presence of left artificial hip joint: Secondary | ICD-10-CM | POA: Diagnosis not present

## 2018-06-25 DIAGNOSIS — M25562 Pain in left knee: Secondary | ICD-10-CM | POA: Diagnosis not present

## 2018-06-25 MED ORDER — METHYLPREDNISOLONE ACETATE 40 MG/ML IJ SUSP
40.0000 mg | INTRAMUSCULAR | Status: AC | PRN
  Administered 2018-06-25: 40 mg via INTRA_ARTICULAR

## 2018-06-25 MED ORDER — LIDOCAINE HCL 1 % IJ SOLN
3.0000 mL | INTRAMUSCULAR | Status: AC | PRN
  Administered 2018-06-25: 3 mL

## 2018-06-25 NOTE — Progress Notes (Signed)
Office Visit Note   Patient: Adam Tapia           Date of Birth: 10/29/1923           MRN: [id number] Visit Date: 06/25/2018              Requested by: [name], MD [place], [place] [address] PCP: [name], MD   Assessment & Plan: Visit Diagnoses:  1. History of left hip replacement   2. Acute pain of left knee     Plan: We will have him ice the knee 15 to 20 minutes today.  He will follow-up as an as-needed basis.  Discussed with him that he can have cortisone injections no more often than every 3 months.  Follow-Up Instructions: Return if symptoms worsen or fail to improve.   Orders:  Orders Placed This Encounter  Procedures  . Large Joint Inj  . XR HIP UNILAT W OR W/O PELVIS 1V LEFT  . XR Knee 1-2 Views Left   No orders of the defined types were placed in this encounter.     Procedures: Large Joint Inj: L knee on 06/25/2018 10:43 AM Indications: pain Details: 22 G 1.5 in needle, anterolateral approach  Arthrogram: No  Medications: 3 mL lidocaine 1 %; 40 mg methylPREDNISolone acetate 40 MG/ML Outcome: tolerated well, no immediate complications Procedure, treatment alternatives, risks and benefits explained, specific risks discussed. Consent was given by the patient. Immediately prior to procedure a time out was called to verify the correct patient, procedure, equipment, support staff and site/side marked as required. Patient was prepped and draped in the usual sterile fashion.       Clinical Data: No additional findings.   Subjective: Chief Complaint  Patient presents with  . Left Hip - Follow-up, Pain    HPI [name] is well-known to Dr. Ninfa Linden service comes in today due to left knee pain.  He states that he is doing some gardening plants flowers spent some time on the knee had some pain in the left knee since that time.  He is having some pain up into the hip he is status post left total hip arthroplasty now 2 years  postop.  Ambulate with a cane was unable to go to church yesterday due to the pain is having his left leg.  No mechanical symptoms left knee. Review of Systems Please see HPI otherwise negative  Objective: Vital Signs: There were no vitals taken for this visit.  Physical Exam  Constitutional: He is oriented to person, place, and time. He appears well-developed and well-nourished. No distress.  Pulmonary/Chest: Effort normal.  Neurological: He is alert and oriented to person, place, and time.  Skin: He is not diaphoretic.    Ortho Exam Left knee no effusion abnormal warmth erythema no instability valgus varus stressing.  Tenderness over the medial joint line only.  Good range of motion of the knee.  Fluid range of motion without pain  left hip. Specialty Comments:  No specialty comments available.  Imaging: Xr Hip Unilat W Or W/o Pelvis 1v Left  Result Date: 06/25/2018 AP pelvis and lateral view left hip: Status post left total hip arthroplasty with well-seated components.  No acute fracture no bony abnormalities.  Hips well located.  Xr Knee 1-2 Views Left  Result Date: 06/25/2018 AP lateral views left knee: Left knee is well located.  No acute fractures.  Moderate medial compartmental narrowing.  Otherwise knee is well-preserved.    [place]  History: Patient Active Problem List   Diagnosis Date Noted  . Osteoarthritis of left hip 03/11/2016  . Status post total replacement of right hip 03/11/2016  . Myeloproliferative disorder ([place]) 08/27/2014  . Leukocytosis 06/03/2013   Past Medical History:  Diagnosis Date  . Acute blood loss anemia   . AF (atrial fibrillation) ([place])   . Allergic rhinitis   . Anemia   . Arthritis   . BPH (benign prostatic hyperplasia)   . Constipation   . Dysrhythmia    PT STATES HAS HX A FIB - FOLLOWED BY DR.KEVIN LITTLE  . GERD (gastroesophageal reflux disease)    OCCASIONAL - HAS NEXIUM IF NEEDED - NOT CURRENTLY TAKING  . Hypertension   .  Myeloproliferative disorder ([place])    FOLLOWED BY DR. Julien Nordmann  AT [place]  . Nocturia   . Primary osteoarthritis of left hip   . Psoriasis   . Unsteady gait     No family history on file.  Past Surgical History:  Procedure Laterality Date  . HERNIA REPAIR  1998  . TOTAL HIP ARTHROPLASTY Left 03/11/2016   Procedure: LEFT TOTAL HIP ARTHROPLASTY ANTERIOR APPROACH;  Surgeon: Mcarthur Rossetti, MD;  Location: [facility];  Service: Orthopedics;  Laterality: Left;  Went from Spinal to an [place] History  . Not on file  Tobacco Use  . Smoking status: Never Smoker  . Smokeless tobacco: Never Used  Substance and Sexual Activity  . Alcohol use: No  . Drug use: No  . Sexual activity: Not on file

## 2018-07-16 ENCOUNTER — Other Ambulatory Visit: Payer: Self-pay | Admitting: Internal Medicine

## 2018-07-16 DIAGNOSIS — D471 Chronic myeloproliferative disease: Secondary | ICD-10-CM

## 2018-07-24 ENCOUNTER — Other Ambulatory Visit: Payer: Self-pay | Admitting: Internal Medicine

## 2018-08-29 ENCOUNTER — Inpatient Hospital Stay: Payer: Medicare Other | Attending: Internal Medicine

## 2018-08-29 ENCOUNTER — Encounter: Payer: Self-pay | Admitting: Oncology

## 2018-08-29 ENCOUNTER — Telehealth: Payer: Self-pay | Admitting: Emergency Medicine

## 2018-08-29 ENCOUNTER — Inpatient Hospital Stay (HOSPITAL_BASED_OUTPATIENT_CLINIC_OR_DEPARTMENT_OTHER): Payer: Medicare Other | Admitting: Oncology

## 2018-08-29 ENCOUNTER — Telehealth: Payer: Self-pay

## 2018-08-29 VITALS — BP 133/70 | HR 58 | Temp 97.8°F | Resp 18 | Ht 68.0 in | Wt 194.2 lb

## 2018-08-29 DIAGNOSIS — Z1589 Genetic susceptibility to other disease: Secondary | ICD-10-CM

## 2018-08-29 DIAGNOSIS — D473 Essential (hemorrhagic) thrombocythemia: Secondary | ICD-10-CM | POA: Insufficient documentation

## 2018-08-29 DIAGNOSIS — D72829 Elevated white blood cell count, unspecified: Secondary | ICD-10-CM | POA: Insufficient documentation

## 2018-08-29 DIAGNOSIS — D471 Chronic myeloproliferative disease: Secondary | ICD-10-CM

## 2018-08-29 LAB — CMP (CANCER CENTER ONLY)
ALK PHOS: 105 U/L (ref 38–126)
ALT: 12 U/L (ref 0–44)
AST: 19 U/L (ref 15–41)
Albumin: 3.4 g/dL — ABNORMAL LOW (ref 3.5–5.0)
Anion gap: 8 (ref 5–15)
BUN: 23 mg/dL (ref 8–23)
CHLORIDE: 106 mmol/L (ref 98–111)
CO2: 24 mmol/L (ref 22–32)
CREATININE: 1.24 mg/dL (ref 0.61–1.24)
Calcium: 9.5 mg/dL (ref 8.9–10.3)
GFR, EST AFRICAN AMERICAN: 56 mL/min — AB (ref >60)
GFR, EST NON AFRICAN AMERICAN: 48 mL/min — AB (ref >60)
Glucose, Bld: 96 mg/dL (ref 70–99)
Potassium: 3.8 mmol/L (ref 3.5–5.1)
Sodium: 138 mmol/L (ref 135–145)
Total Bilirubin: 0.7 mg/dL (ref 0.3–1.2)
Total Protein: 8.3 g/dL — ABNORMAL HIGH (ref 6.5–8.1)

## 2018-08-29 LAB — CBC WITH DIFFERENTIAL (CANCER CENTER ONLY)
ABS IMMATURE GRANULOCYTES: 0.2 10*3/uL — AB (ref 0.00–0.07)
Basophils Absolute: 0.1 10*3/uL (ref 0.0–0.1)
Basophils Relative: 1 %
Eosinophils Absolute: 0 10*3/uL (ref 0.0–0.5)
Eosinophils Relative: 0 %
HCT: 38.4 % — ABNORMAL LOW (ref 39.0–52.0)
Hemoglobin: 13.4 g/dL (ref 13.0–17.0)
IMMATURE GRANULOCYTES: 1 %
LYMPHS ABS: 2.8 10*3/uL (ref 0.7–4.0)
Lymphocytes Relative: 18 %
MCH: 43.8 pg — AB (ref 26.0–34.0)
MCHC: 34.9 g/dL (ref 30.0–36.0)
MCV: 125.5 fL — AB (ref 80.0–100.0)
MONO ABS: 1 10*3/uL (ref 0.1–1.0)
MONOS PCT: 6 %
NEUTROS ABS: 11.1 10*3/uL — AB (ref 1.7–7.7)
NEUTROS PCT: 74 %
PLATELETS: 291 10*3/uL (ref 150–400)
RBC: 3.06 MIL/uL — ABNORMAL LOW (ref 4.22–5.81)
RDW: 12.1 % (ref 11.5–15.5)
WBC Count: 15.2 10*3/uL — ABNORMAL HIGH (ref 4.0–10.5)
nRBC: 0 % (ref 0.0–0.2)

## 2018-08-29 LAB — URIC ACID: URIC ACID, SERUM: 6.9 mg/dL (ref 3.7–8.6)

## 2018-08-29 LAB — LACTATE DEHYDROGENASE: LDH: 166 U/L (ref 98–192)

## 2018-08-29 NOTE — Progress Notes (Signed)
[facility] OFFICE PROGRESS NOTE  [name], MD [place] [address]  DIAGNOSIS:Myeloproliferative disorder with positive JAK-2 mutation.  PRIOR THERAPY:None  CURRENT THERAPY:Hydrea 500 mg by mouth daily. Status post approximately 24months months of treatment.The dose of Hydrea was increased to 500 mg twice a day on 04/03/2018. Status post 5 months of this increased dose.  INTERVAL HISTORY: Adam Tapia 84 y.o. male returns for routine follow-up visit by himself.  The patient is feeling fine today and has no specific complaints.  He continues take his hydroxyurea twice a day and is tolerating this well overall with no concerning complaints.  He denies fevers and chills.  Denies chest pain, shortness of breath, cough, hemoptysis.  Denies nausea, vomiting, constipation, diarrhea.  Denies recent weight loss or night sweats.  Denies rashes and skin breakdown.  The patient is here for evaluation and repeat lab work.  MEDICAL HISTORY: Past Medical History:  Diagnosis Date  . Acute blood loss anemia   . AF (atrial fibrillation) ([place])   . Allergic rhinitis   . Anemia   . Arthritis   . BPH (benign prostatic hyperplasia)   . Constipation   . Dysrhythmia    PT STATES HAS HX A FIB - FOLLOWED BY DR.KEVIN LITTLE  . GERD (gastroesophageal reflux disease)    OCCASIONAL - HAS NEXIUM IF NEEDED - NOT CURRENTLY TAKING  . Hypertension   . Myeloproliferative disorder ([place])    FOLLOWED BY DR. Julien Nordmann  AT [place]  . Nocturia   . Primary osteoarthritis of left hip   . Psoriasis   . Unsteady gait     ALLERGIES:  is allergic to nsaids.  MEDICATIONS:  Current Outpatient Medications  Medication Sig Dispense Refill  . acetaminophen (TYLENOL) 325 MG tablet Take 650 mg by mouth every 6 (six) hours as needed for moderate pain.     Marland Kitchen allopurinol (ZYLOPRIM) 100 MG tablet TAKE 1 TABLET BY MOUTH EVERY DAY 60 tablet 1  . amLODipine (NORVASC) 5 MG tablet  Take 5 mg by mouth 2 (two) times daily.     Marland Kitchen atenolol (TENORMIN) 25 MG tablet Take 25 mg by mouth daily.  5  . esomeprazole (NEXIUM) 20 MG capsule     . fexofenadine (ALLEGRA) 180 MG tablet Take 180 mg by mouth daily.    . fluticasone (FLONASE) 50 MCG/ACT nasal spray Place 1 spray into both nostrils daily as needed for allergies.     . hydroxyurea (HYDREA) 500 MG capsule TAKE 1 CAPSULE BY MOUTH TWICE A DAY MAY TAKE WITH FOOD TO MINIMIZE SIDE EFFECTS 180 capsule 0  . indomethacin (INDOCIN) 50 MG capsule Take 1 capsule (50 mg total) by mouth 3 (three) times daily as needed. 21 capsule 0  . ketotifen (ZADITOR) 0.025 % ophthalmic solution 1 drop 2 (two) times daily.    Marland Kitchen losartan (COZAAR) 50 MG tablet Take 50 mg by mouth every morning.  1  . triamterene-hydrochlorothiazide (MAXZIDE-25) 37.5-25 MG per tablet Take 0.5 tablets by mouth daily.     . TURMERIC PO Take 1 tablet by mouth daily.     No current facility-administered medications for this visit.     SURGICAL HISTORY:  Past Surgical History:  Procedure Laterality Date  . HERNIA REPAIR  1998  . TOTAL HIP ARTHROPLASTY Left 03/11/2016   Procedure: LEFT TOTAL HIP ARTHROPLASTY ANTERIOR APPROACH;  Surgeon: Mcarthur Rossetti, MD;  Location: [facility];  Service: Orthopedics;  Laterality: Left;  Went from Spinal  to an LMA    REVIEW OF SYSTEMS:   Review of Systems  Constitutional: Negative for appetite change, chills, fatigue, fever and unexpected weight change.  HENT:   Negative for mouth sores, nosebleeds, sore throat and trouble swallowing.   Eyes: Negative for eye problems and icterus.  Respiratory: Negative for cough, hemoptysis, shortness of breath and wheezing.   Cardiovascular: Negative for chest pain and leg swelling.  Gastrointestinal: Negative for abdominal pain, constipation, diarrhea, nausea and vomiting.  Genitourinary: Negative for bladder incontinence, difficulty urinating, dysuria, frequency and hematuria.   Musculoskeletal:  Negative for back pain, gait problem, neck pain and neck stiffness.  Skin: Negative for itching and rash.  Neurological: Negative for dizziness, extremity weakness, gait problem, headaches, light-headedness and seizures.  Hematological: Negative for adenopathy. Does not bruise/bleed easily.  Psychiatric/Behavioral: Negative for confusion, depression and sleep disturbance. The patient is not nervous/anxious.     PHYSICAL EXAMINATION:  Blood pressure 133/70, pulse (!) 58, temperature 97.8 F (36.6 C), temperature source Oral, resp. rate 18, height 5\' 8"  (1.727 m), weight 194 lb 3.2 oz (88.1 kg), SpO2 99 %.  ECOG PERFORMANCE STATUS: 1 - Symptomatic but completely ambulatory  Physical Exam  Constitutional: Oriented to person, place, and time and well-developed, well-nourished, and in no distress. No distress.  HENT:  Head: Normocephalic and atraumatic.  Mouth/Throat: Oropharynx is clear and moist. No oropharyngeal exudate.  Eyes: Conjunctivae are normal. Right eye exhibits no discharge. Left eye exhibits no discharge. No scleral icterus.  Neck: Normal range of motion. Neck supple.  Cardiovascular: Normal rate, regular rhythm, normal heart sounds and intact distal pulses.   Pulmonary/Chest: Effort normal and breath sounds normal. No respiratory distress. No wheezes. No rales.  Abdominal: Soft. Bowel sounds are normal. Exhibits no distension and no mass. There is no tenderness.  Musculoskeletal: Normal range of motion. Exhibits no edema.  Lymphadenopathy:    No cervical adenopathy.  Neurological: Alert and oriented to person, place, and time. Exhibits normal muscle tone. Gait normal. Coordination normal.  Skin: Skin is warm and dry. No rash noted. Not diaphoretic. No erythema. No pallor.  Psychiatric: Mood, memory and judgment normal.  Vitals reviewed.  LABORATORY DATA: Lab Results  Component Value Date   WBC 15.2 (H) 08/29/2018   HGB 13.4 08/29/2018   HCT 38.4 (L) 08/29/2018   MCV  125.5 (H) 08/29/2018   PLT 291 08/29/2018      Chemistry      Component Value Date/Time   NA 138 08/29/2018 0917   NA 138 10/05/2017 1018   K 3.8 08/29/2018 0917   K 4.4 10/05/2017 1018   CL 106 08/29/2018 0917   CO2 24 08/29/2018 0917   CO2 25 10/05/2017 1018   BUN 23 08/29/2018 0917   BUN 21.8 10/05/2017 1018   CREATININE 1.24 08/29/2018 0917   CREATININE 1.3 10/05/2017 1018   GLU 88 03/17/2016      Component Value Date/Time   CALCIUM 9.5 08/29/2018 0917   CALCIUM 9.9 10/05/2017 1018   ALKPHOS 105 08/29/2018 0917   ALKPHOS 154 (H) 10/05/2017 1018   AST 19 08/29/2018 0917   AST 18 10/05/2017 1018   ALT 12 08/29/2018 0917   ALT 13 10/05/2017 1018   BILITOT 0.7 08/29/2018 0917   BILITOT 0.45 10/05/2017 1018       RADIOGRAPHIC STUDIES:  No results found.   ASSESSMENT/PLAN:  Myeloproliferative disorder This is a very pleasant 84 yearold African-American male with myeloproliferative disorder and persistent leukocytosis and thrombocytosis with positive  JAK 2 mutation. The patientreceived treatment with hydroxyurea 500 mg p.o. daily status post 38months.His white blood cell count was rising and his dose was increased to 500 mg twice a day. Status post 5 months of the increased dose. He is tolerating his treatment fairly well with no concerning complaints.  The patient was seen with Dr. Julien Nordmann.  CBC results were reviewed.  His white blood cell count is trending down.  He is tolerating his hydroxyurea well overall.  Recommend he continue hydroxyurea 500 mg twice a day. The patient asked about coming off of the allopurinol.  His uric acid level is pending today.  If this is normal, we will instruct the patient to discontinue allopurinol.  We will contact him once we have these results. The patient will follow-up in 3 months for evaluation and repeat lab work.  The patient was advised to call immediately if he has any concerning symptoms in the interval. The patient  voices understanding of current disease status and treatment options and is in agreement with the current care plan. All questions were answered. The patient knows to call the clinic with any problems, questions or concerns. We can certainly see the patient much sooner if necessary.   Orders Placed This Encounter  Procedures  . CBC with Differential (Cancer Center Only)    Standing Status:   Future    Standing Expiration Date:   08/30/2019  . CMP ([facility] only)    Standing Status:   Future    Standing Expiration Date:   08/30/2019  . Lactate dehydrogenase    Standing Status:   Future    Standing Expiration Date:   08/30/2019  . Uric acid    Standing Status:   Future    Standing Expiration Date:   08/29/2019     Mikey Bussing, DNP, AGPCNP-BC, AOCNP 08/29/18   ADDENDUM: Hematology/Oncology Attending: I had a face-to-face encounter with the patient today.  I recommended his care plan.  This is a very pleasant 84 years old African-American male with myeloproliferative disorder with essential thrombocythemia.  The patient is currently on treatment with hydroxyurea 1000 mg p.o. daily.  He has been tolerating his treatment well with no concerning complaints.  His total white blood count is much better than before but still elevated.  His platelets count are within the normal range. I recommended for the patient to continue his current treatment with hydroxyurea 500 mg p.o. twice daily. We will see him back for follow-up visit in 3 months for evaluation and repeat blood work. He was advised to call immediately if he has any concerning symptoms in the interval.  Disclaimer: This note was dictated with voice recognition software. Similar sounding words can inadvertently be transcribed and may be missed upon review. Eilleen Kempf, MD 08/29/18

## 2018-08-29 NOTE — Telephone Encounter (Addendum)
VM left for patient to call back regarding this note   ----- Message from Maryanna Shape, NP sent at 08/29/2018 12:45 PM EDT ----- Please call pt. He may stop the Allopurinol.

## 2018-08-29 NOTE — Telephone Encounter (Signed)
Printed avs and calender of upcoming appointment. Per 10/9 los 

## 2018-08-29 NOTE — Assessment & Plan Note (Signed)
This is a very pleasant 84 yearold African-American male with myeloproliferative disorder and persistent leukocytosis and thrombocytosis with positive JAK 2 mutation. The patientreceived treatment with hydroxyurea 500 mg p.o. daily status post 46months.His white blood cell count was rising and his dose was increased to 500 mg twice a day. Status post 5 months of the increased dose. He is tolerating his treatment fairly well with no concerning complaints.  The patient was seen with Dr. Julien Nordmann.  CBC results were reviewed.  His white blood cell count is trending down.  He is tolerating his hydroxyurea well overall.  Recommend he continue hydroxyurea 500 mg twice a day. The patient asked about coming off of the allopurinol.  His uric acid level is pending today.  If this is normal, we will instruct the patient to discontinue allopurinol.  We will contact him once we have these results. The patient will follow-up in 3 months for evaluation and repeat lab work.  The patient was advised to call immediately if he has any concerning symptoms in the interval. The patient voices understanding of current disease status and treatment options and is in agreement with the current care plan. All questions were answered. The patient knows to call the clinic with any problems, questions or concerns. We can certainly see the patient much sooner if necessary.

## 2018-10-25 ENCOUNTER — Other Ambulatory Visit: Payer: Self-pay | Admitting: Internal Medicine

## 2018-10-25 DIAGNOSIS — D471 Chronic myeloproliferative disease: Secondary | ICD-10-CM

## 2018-11-24 ENCOUNTER — Other Ambulatory Visit: Payer: Self-pay | Admitting: Internal Medicine

## 2018-11-28 ENCOUNTER — Encounter: Payer: Self-pay | Admitting: Internal Medicine

## 2018-11-28 ENCOUNTER — Inpatient Hospital Stay: Payer: Medicare Other | Attending: Internal Medicine

## 2018-11-28 ENCOUNTER — Telehealth: Payer: Self-pay | Admitting: Internal Medicine

## 2018-11-28 ENCOUNTER — Inpatient Hospital Stay (HOSPITAL_BASED_OUTPATIENT_CLINIC_OR_DEPARTMENT_OTHER): Payer: Medicare Other | Admitting: Internal Medicine

## 2018-11-28 VITALS — BP 104/55 | HR 63 | Temp 98.5°F | Resp 20 | Ht 68.0 in | Wt 196.7 lb

## 2018-11-28 DIAGNOSIS — D473 Essential (hemorrhagic) thrombocythemia: Secondary | ICD-10-CM | POA: Diagnosis not present

## 2018-11-28 DIAGNOSIS — D72829 Elevated white blood cell count, unspecified: Secondary | ICD-10-CM | POA: Diagnosis not present

## 2018-11-28 DIAGNOSIS — C946 Myelodysplastic disease, not classified: Secondary | ICD-10-CM | POA: Insufficient documentation

## 2018-11-28 DIAGNOSIS — Z79899 Other long term (current) drug therapy: Secondary | ICD-10-CM | POA: Diagnosis not present

## 2018-11-28 DIAGNOSIS — D471 Chronic myeloproliferative disease: Secondary | ICD-10-CM

## 2018-11-28 LAB — CBC WITH DIFFERENTIAL (CANCER CENTER ONLY)
ABS IMMATURE GRANULOCYTES: 0.23 10*3/uL — AB (ref 0.00–0.07)
BASOS ABS: 0.1 10*3/uL (ref 0.0–0.1)
Basophils Relative: 1 %
Eosinophils Absolute: 0 10*3/uL (ref 0.0–0.5)
Eosinophils Relative: 0 %
HCT: 37.8 % — ABNORMAL LOW (ref 39.0–52.0)
HEMOGLOBIN: 13.2 g/dL (ref 13.0–17.0)
IMMATURE GRANULOCYTES: 2 %
LYMPHS PCT: 16 %
Lymphs Abs: 2.1 10*3/uL (ref 0.7–4.0)
MCH: 43.1 pg — ABNORMAL HIGH (ref 26.0–34.0)
MCHC: 34.9 g/dL (ref 30.0–36.0)
MCV: 123.5 fL — AB (ref 80.0–100.0)
Monocytes Absolute: 1.3 10*3/uL — ABNORMAL HIGH (ref 0.1–1.0)
Monocytes Relative: 10 %
NEUTROS PCT: 71 %
NRBC: 0 % (ref 0.0–0.2)
Neutro Abs: 9.3 10*3/uL — ABNORMAL HIGH (ref 1.7–7.7)
PLATELETS: 286 10*3/uL (ref 150–400)
RBC: 3.06 MIL/uL — ABNORMAL LOW (ref 4.22–5.81)
RDW: 12.7 % (ref 11.5–15.5)
WBC Count: 12.9 10*3/uL — ABNORMAL HIGH (ref 4.0–10.5)

## 2018-11-28 LAB — CMP (CANCER CENTER ONLY)
ALT: 10 U/L (ref 0–44)
AST: 15 U/L (ref 15–41)
Albumin: 3.3 g/dL — ABNORMAL LOW (ref 3.5–5.0)
Alkaline Phosphatase: 83 U/L (ref 38–126)
Anion gap: 9 (ref 5–15)
BILIRUBIN TOTAL: 0.5 mg/dL (ref 0.3–1.2)
BUN: 27 mg/dL — AB (ref 8–23)
CO2: 22 mmol/L (ref 22–32)
Calcium: 9.3 mg/dL (ref 8.9–10.3)
Chloride: 104 mmol/L (ref 98–111)
Creatinine: 1.58 mg/dL — ABNORMAL HIGH (ref 0.61–1.24)
GFR, EST AFRICAN AMERICAN: 43 mL/min — AB (ref >60)
GFR, EST NON AFRICAN AMERICAN: 37 mL/min — AB (ref >60)
Glucose, Bld: 111 mg/dL — ABNORMAL HIGH (ref 70–99)
POTASSIUM: 4 mmol/L (ref 3.5–5.1)
Sodium: 135 mmol/L (ref 135–145)
TOTAL PROTEIN: 8.1 g/dL (ref 6.5–8.1)

## 2018-11-28 LAB — LACTATE DEHYDROGENASE: LDH: 175 U/L (ref 98–192)

## 2018-11-28 LAB — URIC ACID: Uric Acid, Serum: 7.5 mg/dL (ref 3.7–8.6)

## 2018-11-28 NOTE — Telephone Encounter (Signed)
Printed calendar and avs. °

## 2018-11-28 NOTE — Progress Notes (Signed)
[facility] [phone]   Fax:([phone]) [place], [place] [address]  DIAGNOSIS: Myeloproliferative disorder with positive JAK-2 mutation.  PRIOR THERAPY: None.  CURRENT THERAPY: Hydrea 500 mg by mouth daily. Status post approximately 88months months of treatment.The dose of Hydrea was increased to 500 mg twice a day on 04/03/2018. Status post45monthsof this increased dose.  INTERVAL HISTORY: Adam Tapia 85 y.o. male returns to the clinic today for follow-up visit.  The patient is feeling fine today with no concerning complaints.  He continues to tolerate his treatment with hydroxyurea fairly well.  He denied having any chest pain, shortness of breath, cough or hemoptysis.  He denied having any weight loss or night sweats.  He has no bleeding issues.  He denied having any nausea, vomiting, diarrhea or constipation.  The patient is here today for evaluation and repeat blood work.  ALLERGIES:  is allergic to nsaids.  MEDICATIONS:  Current Outpatient Medications  Medication Sig Dispense Refill  . acetaminophen (TYLENOL) 325 MG tablet Take 650 mg by mouth every 6 (six) hours as needed for moderate pain.     Marland Kitchen allopurinol (ZYLOPRIM) 100 MG tablet TAKE 1 TABLET BY MOUTH EVERY DAY 60 tablet 1  . amLODipine (NORVASC) 5 MG tablet Take 5 mg by mouth 2 (two) times daily.     Marland Kitchen atenolol (TENORMIN) 25 MG tablet Take 25 mg by mouth daily.  5  . esomeprazole (NEXIUM) 20 MG capsule     . fexofenadine (ALLEGRA) 180 MG tablet Take 180 mg by mouth daily.    . fluticasone (FLONASE) 50 MCG/ACT nasal spray Place 1 spray into both nostrils daily as needed for allergies.     . hydroxyurea (HYDREA) 500 MG capsule TAKE 1 CAPSULE BY MOUTH TWICE A DAY MAY TAKE WITH FOOD TO MINIMIZE SIDE EFFECTS 180 capsule 0  . indomethacin (INDOCIN) 50 MG capsule Take 1 capsule (50 mg total) by mouth 3 (three) times daily as  needed. 21 capsule 0  . ketotifen (ZADITOR) 0.025 % ophthalmic solution 1 drop 2 (two) times daily.    Marland Kitchen losartan (COZAAR) 50 MG tablet Take 50 mg by mouth every morning.  1  . triamterene-hydrochlorothiazide (MAXZIDE-25) 37.5-25 MG per tablet Take 0.5 tablets by mouth daily.     . TURMERIC PO Take 1 tablet by mouth daily.     No current facility-administered medications for this visit.     REVIEW OF SYSTEMS:  A comprehensive review of systems was negative.   PHYSICAL EXAMINATION: General appearance: alert, cooperative and no distress Head: Normocephalic, without obvious abnormality, atraumatic Neck: no adenopathy Lymph nodes: Cervical, supraclavicular, and axillary nodes normal. Resp: clear to auscultation bilaterally Back: symmetric, no curvature. ROM normal. No CVA tenderness. Cardio: regular rate and rhythm, S1, S2 normal, no murmur, click, rub or gallop GI: soft, non-tender; bowel sounds normal; no masses,  no organomegaly Extremities: Swelling and inflammation of the right big toe  ECOG PERFORMANCE STATUS: 1 - Symptomatic but completely ambulatory  Blood pressure (!) 104/55, pulse 63, temperature 98.5 F (36.9 C), temperature source Oral, resp. rate 20, height 5\' 8"  (1.727 m), weight 196 lb 11.2 oz (89.2 kg), SpO2 98 %.  LABORATORY DATA: Lab Results  Component Value Date   WBC 12.9 (H) 11/28/2018   HGB 13.2 11/28/2018   HCT 37.8 (L) 11/28/2018   MCV 123.5 (H) 11/28/2018   PLT 286 11/28/2018  Chemistry      Component Value Date/Time   NA 138 08/29/2018 0917   NA 138 10/05/2017 1018   K 3.8 08/29/2018 0917   K 4.4 10/05/2017 1018   CL 106 08/29/2018 0917   CO2 24 08/29/2018 0917   CO2 25 10/05/2017 1018   BUN 23 08/29/2018 0917   BUN 21.8 10/05/2017 1018   CREATININE 1.24 08/29/2018 0917   CREATININE 1.3 10/05/2017 1018   GLU 88 03/17/2016      Component Value Date/Time   CALCIUM 9.5 08/29/2018 0917   CALCIUM 9.9 10/05/2017 1018   ALKPHOS 105 08/29/2018  0917   ALKPHOS 154 (H) 10/05/2017 1018   AST 19 08/29/2018 0917   AST 18 10/05/2017 1018   ALT 12 08/29/2018 0917   ALT 13 10/05/2017 1018   BILITOT 0.7 08/29/2018 0917   BILITOT 0.45 10/05/2017 1018       RADIOGRAPHIC STUDIES: No results found.  ASSESSMENT AND PLAN:  This is a very pleasant 85 years old African-American male with myeloproliferative disorder and persistent leukocytosis and thrombocytosis with positive JAK 2 mutation. The patient is currently on treatment with hydroxyurea 500 mg p.o. daily status post 38 months. His treatment was a change it to 500 mg p.o. twice daily in May 2019, status post 8 months of treatment and he has been tolerating this treatment well. CBC today showed no concerning finding except for the mild leukocytosis. I recommended for the patient to continue his current treatment with hydroxyurea with the same dose. I will see him back for follow-up visit in 3 months for evaluation with repeat CBC, comprehensive metabolic panel and LDH. The patient was advised to call immediately if he has any concerning symptoms in the interval. The patient voices understanding of current disease status and treatment options and is in agreement with the current care plan. All questions were answered. The patient knows to call the clinic with any problems, questions or concerns. We can certainly see the patient much sooner if necessary.  Disclaimer: This note was dictated with voice recognition software. Similar sounding words can inadvertently be transcribed and may not be corrected upon review.

## 2019-01-30 ENCOUNTER — Other Ambulatory Visit: Payer: Self-pay | Admitting: Internal Medicine

## 2019-01-30 DIAGNOSIS — D471 Chronic myeloproliferative disease: Secondary | ICD-10-CM

## 2019-02-25 ENCOUNTER — Telehealth: Payer: Self-pay | Admitting: Medical Oncology

## 2019-02-25 NOTE — Telephone Encounter (Signed)
Yes

## 2019-02-25 NOTE — Telephone Encounter (Signed)
Wants to move appt out 1 month . On Hydrea bid

## 2019-02-26 ENCOUNTER — Inpatient Hospital Stay: Payer: Medicare Other | Admitting: Internal Medicine

## 2019-02-26 ENCOUNTER — Inpatient Hospital Stay: Payer: Medicare Other

## 2019-03-01 ENCOUNTER — Telehealth: Payer: Self-pay | Admitting: Internal Medicine

## 2019-03-01 NOTE — Telephone Encounter (Signed)
Scheduled appt per sch msg. Called patient, no answer, left msg. Will mail printout

## 2019-03-20 ENCOUNTER — Other Ambulatory Visit: Payer: Self-pay | Admitting: Internal Medicine

## 2019-03-20 DIAGNOSIS — D471 Chronic myeloproliferative disease: Secondary | ICD-10-CM

## 2019-03-30 ENCOUNTER — Other Ambulatory Visit: Payer: Self-pay | Admitting: Internal Medicine

## 2019-04-01 ENCOUNTER — Encounter: Payer: Self-pay | Admitting: Internal Medicine

## 2019-04-01 ENCOUNTER — Other Ambulatory Visit: Payer: Self-pay | Admitting: Medical Oncology

## 2019-04-01 ENCOUNTER — Other Ambulatory Visit: Payer: Self-pay

## 2019-04-01 ENCOUNTER — Inpatient Hospital Stay: Payer: Medicare Other | Attending: Internal Medicine | Admitting: Internal Medicine

## 2019-04-01 ENCOUNTER — Inpatient Hospital Stay: Payer: Medicare Other

## 2019-04-01 VITALS — BP 131/60 | HR 56 | Temp 97.1°F | Resp 18 | Ht 68.0 in | Wt 201.7 lb

## 2019-04-01 DIAGNOSIS — D471 Chronic myeloproliferative disease: Secondary | ICD-10-CM

## 2019-04-01 DIAGNOSIS — Z79899 Other long term (current) drug therapy: Secondary | ICD-10-CM | POA: Diagnosis not present

## 2019-04-01 LAB — CBC WITH DIFFERENTIAL (CANCER CENTER ONLY)
Abs Immature Granulocytes: 0.33 10*3/uL — ABNORMAL HIGH (ref 0.00–0.07)
Basophils Absolute: 0.1 10*3/uL (ref 0.0–0.1)
Basophils Relative: 1 %
Eosinophils Absolute: 0 10*3/uL (ref 0.0–0.5)
Eosinophils Relative: 0 %
HCT: 37.7 % — ABNORMAL LOW (ref 39.0–52.0)
Hemoglobin: 13 g/dL (ref 13.0–17.0)
Immature Granulocytes: 3 %
Lymphocytes Relative: 24 %
Lymphs Abs: 2.6 10*3/uL (ref 0.7–4.0)
MCH: 44.1 pg — ABNORMAL HIGH (ref 26.0–34.0)
MCHC: 34.5 g/dL (ref 30.0–36.0)
MCV: 127.8 fL — ABNORMAL HIGH (ref 80.0–100.0)
Monocytes Absolute: 0.9 10*3/uL (ref 0.1–1.0)
Monocytes Relative: 9 %
Neutro Abs: 6.6 10*3/uL (ref 1.7–7.7)
Neutrophils Relative %: 63 %
Platelet Count: 256 10*3/uL (ref 150–400)
RBC: 2.95 MIL/uL — ABNORMAL LOW (ref 4.22–5.81)
RDW: 12.7 % (ref 11.5–15.5)
WBC Count: 10.5 10*3/uL (ref 4.0–10.5)
nRBC: 0 % (ref 0.0–0.2)

## 2019-04-01 LAB — CMP (CANCER CENTER ONLY)
ALT: 12 U/L (ref 0–44)
AST: 18 U/L (ref 15–41)
Albumin: 3.4 g/dL — ABNORMAL LOW (ref 3.5–5.0)
Alkaline Phosphatase: 76 U/L (ref 38–126)
Anion gap: 6 (ref 5–15)
BUN: 31 mg/dL — ABNORMAL HIGH (ref 8–23)
CO2: 24 mmol/L (ref 22–32)
Calcium: 9.2 mg/dL (ref 8.9–10.3)
Chloride: 106 mmol/L (ref 98–111)
Creatinine: 1.41 mg/dL — ABNORMAL HIGH (ref 0.61–1.24)
GFR, Est AFR Am: 49 mL/min — ABNORMAL LOW (ref >60)
GFR, Estimated: 43 mL/min — ABNORMAL LOW (ref >60)
Glucose, Bld: 100 mg/dL — ABNORMAL HIGH (ref 70–99)
Potassium: 4.4 mmol/L (ref 3.5–5.1)
Sodium: 136 mmol/L (ref 135–145)
Total Bilirubin: 0.4 mg/dL (ref 0.3–1.2)
Total Protein: 8.1 g/dL (ref 6.5–8.1)

## 2019-04-01 LAB — LACTATE DEHYDROGENASE: LDH: 170 U/L (ref 98–192)

## 2019-04-01 LAB — URIC ACID: Uric Acid, Serum: 6.5 mg/dL (ref 3.7–8.6)

## 2019-04-01 NOTE — Progress Notes (Signed)
Adam Tapia [phone]   Fax:([phone] [place], Adam Tapia [place] [address]  DIAGNOSIS: Myeloproliferative disorder with positive JAK-2 mutation.  PRIOR THERAPY: None.  CURRENT THERAPY: Hydrea 500 mg by mouth daily. Status post approximately 23months months of treatment.The dose of Hydrea was increased to 500 mg twice a day on 04/03/2018. Status post37monthsof this increased dose.  INTERVAL HISTORY: Adam Tapia 85 y.o. male returns to the clinic today for follow-up visit.  The patient is feeling fine today with no concerning complaints except for mild fatigue.  He denied having any chest pain, shortness of breath, cough or hemoptysis.  He denied having any fever or chills.  He has no nausea, vomiting, diarrhea or constipation.  He is currently on hydroxyurea 500 mg p.o. twice daily and has been tolerating this treatment well.  He is here today for evaluation and repeat blood work.  ALLERGIES:  is allergic to nsaids.  MEDICATIONS:  Current Outpatient Medications  Medication Sig Dispense Refill  . acetaminophen (TYLENOL) 325 MG tablet Take 650 mg by mouth every 6 (six) hours as needed for moderate pain.     Marland Kitchen allopurinol (ZYLOPRIM) 100 MG tablet TAKE 1 TABLET BY MOUTH EVERY DAY 60 tablet 1  . amLODipine (NORVASC) 5 MG tablet Take 5 mg by mouth 2 (two) times daily.     Marland Kitchen atenolol (TENORMIN) 25 MG tablet Take 25 mg by mouth daily.  5  . esomeprazole (NEXIUM) 20 MG capsule     . fexofenadine (ALLEGRA) 180 MG tablet Take 180 mg by mouth daily.    . fluticasone (FLONASE) 50 MCG/ACT nasal spray Place 1 spray into both nostrils daily as needed for allergies.     . hydroxyurea (HYDREA) 500 MG capsule TAKE 1 CAPSULE BY MOUTH TWICE A DAY MAY TAKE WITH FOOD TO MINIMIZE SIDE EFFECTS 180 capsule 0  . indomethacin (INDOCIN) 50 MG capsule Take 1 capsule (50 mg total) by mouth 3 (three) times daily as needed.  21 capsule 0  . ketotifen (ZADITOR) 0.025 % ophthalmic solution 1 drop 2 (two) times daily.    Marland Kitchen losartan (COZAAR) 50 MG tablet Take 50 mg by mouth every morning.  1  . triamterene-hydrochlorothiazide (MAXZIDE-25) 37.5-25 MG per tablet Take 0.5 tablets by mouth daily.     . TURMERIC PO Take 1 tablet by mouth daily.     No current facility-administered medications for this visit.     REVIEW OF SYSTEMS:  A comprehensive review of systems was negative except for: Constitutional: positive for fatigue   PHYSICAL EXAMINATION: General appearance: alert, cooperative and no distress Head: Normocephalic, without obvious abnormality, atraumatic Neck: no adenopathy Lymph nodes: Cervical, supraclavicular, and axillary nodes normal. Resp: clear to auscultation bilaterally Back: symmetric, no curvature. ROM normal. No CVA tenderness. Cardio: regular rate and rhythm, S1, S2 normal, no murmur, click, rub or gallop GI: soft, non-tender; bowel sounds normal; no masses,  no organomegaly Extremities: extremities normal, atraumatic, no cyanosis or edema  ECOG PERFORMANCE STATUS: 1 - Symptomatic but completely ambulatory  Blood pressure 131/60, pulse (!) 56, temperature (!) 97.1 F (36.2 C), temperature source Oral, resp. rate 18, height 5\' 8"  (1.727 m), weight 201 lb 11.2 oz (91.5 kg), SpO2 98 %.  LABORATORY DATA: Lab Results  Component Value Date   WBC 10.5 04/01/2019   HGB 13.0 04/01/2019   HCT 37.7 (L) 04/01/2019   MCV 127.8 (H) 04/01/2019  PLT 256 04/01/2019      Chemistry      Component Value Date/Time   NA 135 11/28/2018 0829   NA 138 10/05/2017 1018   K 4.0 11/28/2018 0829   K 4.4 10/05/2017 1018   CL 104 11/28/2018 0829   CO2 22 11/28/2018 0829   CO2 25 10/05/2017 1018   BUN 27 (H) 11/28/2018 0829   BUN 21.8 10/05/2017 1018   CREATININE 1.58 (H) 11/28/2018 0829   CREATININE 1.3 10/05/2017 1018   GLU 88 03/17/2016      Component Value Date/Time   CALCIUM 9.3 11/28/2018 0829    CALCIUM 9.9 10/05/2017 1018   ALKPHOS 83 11/28/2018 0829   ALKPHOS 154 (H) 10/05/2017 1018   AST 15 11/28/2018 0829   AST 18 10/05/2017 1018   ALT 10 11/28/2018 0829   ALT 13 10/05/2017 1018   BILITOT 0.5 11/28/2018 0829   BILITOT 0.45 10/05/2017 1018       RADIOGRAPHIC STUDIES: No results found.  ASSESSMENT AND PLAN:  This is a very pleasant 85 years old African-American male with myeloproliferative disorder and persistent leukocytosis and thrombocytosis with positive JAK 2 mutation. The patient is currently on treatment with hydroxyurea 500 mg p.o. daily status post 38 months. His treatment was a change it to 500 mg p.o. twice daily in May 2019, status post 12 months of treatment. The patient has been tolerating this treatment well with no concerning adverse effects. He had repeat CBC performed earlier today that showed stable blood count.  Comprehensive metabolic panel, LDH and uric acid are still pending. I recommended for the patient to continue his current treatment with hydroxyurea with the same dose. I will see him back for follow-up visit in 4 months for evaluation with repeat blood work. He was advised to call immediately if he has any concerning symptoms in the interval. The patient voices understanding of current disease status and treatment options and is in agreement with the current care plan. All questions were answered. The patient knows to call the clinic with any problems, questions or concerns. We can certainly see the patient much sooner if necessary.  Disclaimer: This note was dictated with voice recognition software. Similar sounding words can inadvertently be transcribed and may not be corrected upon review.

## 2019-04-02 ENCOUNTER — Telehealth: Payer: Self-pay | Admitting: Internal Medicine

## 2019-04-02 NOTE — Telephone Encounter (Signed)
F/u appt in  4 months. Reminder letter mail with appt date and time per 5/11 los

## 2019-05-23 ENCOUNTER — Other Ambulatory Visit: Payer: Self-pay | Admitting: Internal Medicine

## 2019-06-15 ENCOUNTER — Other Ambulatory Visit: Payer: Self-pay | Admitting: Internal Medicine

## 2019-06-15 DIAGNOSIS — D471 Chronic myeloproliferative disease: Secondary | ICD-10-CM

## 2019-08-05 ENCOUNTER — Inpatient Hospital Stay: Payer: Medicare Other

## 2019-08-05 ENCOUNTER — Telehealth: Payer: Self-pay | Admitting: Internal Medicine

## 2019-08-05 ENCOUNTER — Encounter: Payer: Self-pay | Admitting: Physician Assistant

## 2019-08-05 ENCOUNTER — Other Ambulatory Visit: Payer: Self-pay

## 2019-08-05 ENCOUNTER — Inpatient Hospital Stay: Payer: Medicare Other | Attending: Internal Medicine | Admitting: Physician Assistant

## 2019-08-05 VITALS — BP 124/67 | HR 58 | Temp 98.0°F | Resp 18 | Ht 68.0 in | Wt 200.3 lb

## 2019-08-05 DIAGNOSIS — Z23 Encounter for immunization: Secondary | ICD-10-CM | POA: Diagnosis present

## 2019-08-05 DIAGNOSIS — D471 Chronic myeloproliferative disease: Secondary | ICD-10-CM | POA: Diagnosis not present

## 2019-08-05 LAB — CBC WITH DIFFERENTIAL (CANCER CENTER ONLY)
Abs Immature Granulocytes: 0.19 10*3/uL — ABNORMAL HIGH (ref 0.00–0.07)
Basophils Absolute: 0.1 10*3/uL (ref 0.0–0.1)
Basophils Relative: 1 %
Eosinophils Absolute: 0 10*3/uL (ref 0.0–0.5)
Eosinophils Relative: 0 %
HCT: 36.2 % — ABNORMAL LOW (ref 39.0–52.0)
Hemoglobin: 12.7 g/dL — ABNORMAL LOW (ref 13.0–17.0)
Immature Granulocytes: 2 %
Lymphocytes Relative: 25 %
Lymphs Abs: 2.5 10*3/uL (ref 0.7–4.0)
MCH: 44.3 pg — ABNORMAL HIGH (ref 26.0–34.0)
MCHC: 35.1 g/dL (ref 30.0–36.0)
MCV: 126.1 fL — ABNORMAL HIGH (ref 80.0–100.0)
Monocytes Absolute: 1 10*3/uL (ref 0.1–1.0)
Monocytes Relative: 10 %
Neutro Abs: 6.3 10*3/uL (ref 1.7–7.7)
Neutrophils Relative %: 62 %
Platelet Count: 267 10*3/uL (ref 150–400)
RBC: 2.87 MIL/uL — ABNORMAL LOW (ref 4.22–5.81)
RDW: 12.2 % (ref 11.5–15.5)
WBC Count: 10 10*3/uL (ref 4.0–10.5)
nRBC: 0 % (ref 0.0–0.2)

## 2019-08-05 LAB — CMP (CANCER CENTER ONLY)
ALT: 8 U/L (ref 0–44)
AST: 16 U/L (ref 15–41)
Albumin: 3.8 g/dL (ref 3.5–5.0)
Alkaline Phosphatase: 74 U/L (ref 38–126)
Anion gap: 6 (ref 5–15)
BUN: 21 mg/dL (ref 8–23)
CO2: 25 mmol/L (ref 22–32)
Calcium: 9.4 mg/dL (ref 8.9–10.3)
Chloride: 106 mmol/L (ref 98–111)
Creatinine: 1.38 mg/dL — ABNORMAL HIGH (ref 0.61–1.24)
GFR, Est AFR Am: 50 mL/min — ABNORMAL LOW (ref >60)
GFR, Estimated: 43 mL/min — ABNORMAL LOW (ref >60)
Glucose, Bld: 94 mg/dL (ref 70–99)
Potassium: 3.9 mmol/L (ref 3.5–5.1)
Sodium: 137 mmol/L (ref 135–145)
Total Bilirubin: 0.5 mg/dL (ref 0.3–1.2)
Total Protein: 8.1 g/dL (ref 6.5–8.1)

## 2019-08-05 LAB — LACTATE DEHYDROGENASE: LDH: 179 U/L (ref 98–192)

## 2019-08-05 MED ORDER — INFLUENZA VAC A&B SA ADJ QUAD 0.5 ML IM PRSY
0.5000 mL | PREFILLED_SYRINGE | Freq: Once | INTRAMUSCULAR | Status: AC
  Administered 2019-08-05: 0.5 mL via INTRAMUSCULAR

## 2019-08-05 MED ORDER — INFLUENZA VAC A&B SA ADJ QUAD 0.5 ML IM PRSY
PREFILLED_SYRINGE | INTRAMUSCULAR | Status: AC
  Filled 2019-08-05: qty 0.5

## 2019-08-05 MED ORDER — INFLUENZA VAC A&B SA ADJ QUAD 0.5 ML IM PRSY: 0.5000 mL | PREFILLED_SYRINGE | INTRAMUSCULAR | Status: DC

## 2019-08-05 NOTE — Telephone Encounter (Signed)
Scheduled per 09/14 los, patient received after visit summary and calender. °

## 2019-08-05 NOTE — Progress Notes (Signed)
[facility] OFFICE PROGRESS NOTE  [name], MD [place] [address]  DIAGNOSIS: Myeloproliferative disorder with positive JAK-2 mutation.  PRIOR THERAPY: None  CURRENT THERAPY: Hydrea 500 mg by mouth daily. Status post approximately 60months months of treatment.The dose of Hydrea was increased to 500 mg twice a day on 04/03/2018. Status post75monthsof this increased dose.  INTERVAL HISTORY: Adam Tapia 85 y.o. male returns to the clinic for a follow up visit. The patient is feeling well today without any concerning complaints. He denies any chest pain, shortness of breath, cough, and hemotysis. He denies any fever, chills, night sweats, or weight loss. He denies any nausea, vomiting, diarrhea, or constipation. He denies any headaches, visual changes, or dizziness. He denies any rashes or skin changes. He is tolerating his dose of Hydroxyurea 500 mg p.o BID well without any adverse side effects. He is here today for evaluation and repeat blood work.   MEDICAL HISTORY: Past Medical History:  Diagnosis Date  . Acute blood loss anemia   . AF (atrial fibrillation) ([facility])   . Allergic rhinitis   . Anemia   . Arthritis   . BPH (benign prostatic hyperplasia)   . Constipation   . Dysrhythmia    PT STATES HAS HX A FIB - FOLLOWED BY DR.KEVIN LITTLE  . GERD (gastroesophageal reflux disease)    OCCASIONAL - HAS NEXIUM IF NEEDED - NOT CURRENTLY TAKING  . Hypertension   . Myeloproliferative disorder ([facility])    FOLLOWED BY DR. Julien Nordmann  AT [facility]  . Nocturia   . Primary osteoarthritis of left hip   . Psoriasis   . Unsteady gait     ALLERGIES:  is allergic to nsaids.  MEDICATIONS:  Current Outpatient Medications  Medication Sig Dispense Refill  . acetaminophen (TYLENOL) 325 MG tablet Take 650 mg by mouth every 6 (six) hours as needed for moderate pain.     Marland Kitchen allopurinol (ZYLOPRIM) 100 MG tablet TAKE 1 TABLET BY MOUTH EVERY DAY 60 tablet  1  . amLODipine (NORVASC) 5 MG tablet Take 5 mg by mouth 2 (two) times daily.     Marland Kitchen atenolol (TENORMIN) 25 MG tablet Take 25 mg by mouth daily.  5  . candesartan (ATACAND) 8 MG tablet Take 8 mg by mouth daily.    Marland Kitchen esomeprazole (NEXIUM) 20 MG capsule     . fexofenadine (ALLEGRA) 180 MG tablet Take 180 mg by mouth daily.    . fluticasone (FLONASE) 50 MCG/ACT nasal spray Place 1 spray into both nostrils daily as needed for allergies.     . hydroxyurea (HYDREA) 500 MG capsule TAKE 1 CAPSULE BY MOUTH TWICE A DAY MAY TAKE WITH FOOD TO MINIMIZE SIDE EFFECTS 180 capsule 0  . indomethacin (INDOCIN) 50 MG capsule Take 1 capsule (50 mg total) by mouth 3 (three) times daily as needed. 21 capsule 0  . ketotifen (ZADITOR) 0.025 % ophthalmic solution 1 drop 2 (two) times daily.    Marland Kitchen losartan (COZAAR) 50 MG tablet Take 50 mg by mouth every morning.  1  . TURMERIC PO Take 1 tablet by mouth daily.    Marland Kitchen triamterene-hydrochlorothiazide (MAXZIDE-25) 37.5-25 MG per tablet Take 0.5 tablets by mouth daily.      Current Facility-Administered Medications  Medication Dose Route Frequency Provider Last Rate Last Dose  . influenza vaccine adjuvanted (FLUAD) injection 0.5 mL  0.5 mL Intramuscular Once ,  L, PA-C      . [START ON 08/06/2019] influenza  vaccine adjuvanted (FLUAD) injection 0.5 mL  0.5 mL Intramuscular Tomorrow-1000 ,  L, PA-C        SURGICAL HISTORY:  Past Surgical History:  Procedure Laterality Date  . HERNIA REPAIR  1998  . TOTAL HIP ARTHROPLASTY Left 03/11/2016   Procedure: LEFT TOTAL HIP ARTHROPLASTY ANTERIOR APPROACH;  Surgeon: Mcarthur Rossetti, MD;  Location: [facility];  Service: Orthopedics;  Laterality: Left;  Went from Spinal to an LMA    REVIEW OF SYSTEMS:   Review of Systems  Constitutional: Negative for appetite change, chills, fatigue, fever and unexpected weight change.  HENT: Negative for mouth sores, nosebleeds, sore throat and trouble  swallowing.   Eyes: Negative for eye problems and icterus.  Respiratory: Negative for cough, hemoptysis, shortness of breath and wheezing.   Cardiovascular: Negative for chest pain and leg swelling.  Gastrointestinal: Negative for abdominal pain, constipation, diarrhea, nausea and vomiting.  Genitourinary: Negative for bladder incontinence, difficulty urinating, dysuria, frequency and hematuria.   Musculoskeletal: Negative for back pain, gait problem, neck pain and neck stiffness.  Skin: Negative for itching and rash.  Neurological: Negative for dizziness, extremity weakness, gait problem, headaches, light-headedness and seizures.  Hematological: Negative for adenopathy. Does not bruise/bleed easily.  Psychiatric/Behavioral: Negative for confusion, depression and sleep disturbance. The patient is not nervous/anxious.     PHYSICAL EXAMINATION:  Blood pressure 124/67, pulse (!) 58, temperature 98 F (36.7 C), temperature source Temporal, resp. rate 18, height 5\' 8"  (1.727 m), weight 200 lb 4.8 oz (90.9 kg), SpO2 98 %.  ECOG PERFORMANCE STATUS: 1 - Symptomatic but completely ambulatory  Physical Exam  Constitutional: Oriented to person, place, and time and well-developed, well-nourished, and in no distress.  HENT:  Head: Normocephalic and atraumatic.  Mouth/Throat: Oropharynx is clear and moist. No oropharyngeal exudate.  Eyes: Conjunctivae are normal. Right eye exhibits no discharge. Left eye exhibits no discharge. No scleral icterus.  Neck: Normal range of motion. Neck supple.  Cardiovascular: Normal rate, regular rhythm, normal heart sounds and intact distal pulses.   Pulmonary/Chest: Effort normal and breath sounds normal. No respiratory distress. No wheezes. No rales.  Abdominal: Soft. Bowel sounds are normal. Exhibits no distension and no mass. There is no tenderness.  Musculoskeletal: Normal range of motion. Exhibits no edema.  Lymphadenopathy:    No cervical adenopathy.   Neurological: Alert and oriented to person, place, and time. Exhibits normal muscle tone. Coordination normal. Uses a cane for ambulation.  Skin: Skin is warm and dry. No rash noted. Not diaphoretic. No erythema. No pallor.  Psychiatric: Mood, memory and judgment normal.  Vitals reviewed.  LABORATORY DATA: Lab Results  Component Value Date   WBC 10.0 08/05/2019   HGB 12.7 (L) 08/05/2019   HCT 36.2 (L) 08/05/2019   MCV 126.1 (H) 08/05/2019   PLT 267 08/05/2019      Chemistry      Component Value Date/Time   NA 137 08/05/2019 1020   NA 138 10/05/2017 1018   K 3.9 08/05/2019 1020   K 4.4 10/05/2017 1018   CL 106 08/05/2019 1020   CO2 25 08/05/2019 1020   CO2 25 10/05/2017 1018   BUN 21 08/05/2019 1020   BUN 21.8 10/05/2017 1018   CREATININE 1.38 (H) 08/05/2019 1020   CREATININE 1.3 10/05/2017 1018   GLU 88 03/17/2016      Component Value Date/Time   CALCIUM 9.4 08/05/2019 1020   CALCIUM 9.9 10/05/2017 1018   ALKPHOS 74 08/05/2019 1020   ALKPHOS 154 (H) 10/05/2017  1018   AST 16 08/05/2019 1020   AST 18 10/05/2017 1018   ALT 8 08/05/2019 1020   ALT 13 10/05/2017 1018   BILITOT 0.5 08/05/2019 1020   BILITOT 0.45 10/05/2017 1018       RADIOGRAPHIC STUDIES:  No results found.   ASSESSMENT/PLAN:  This is a very pleasant 85 year old African-American male with myeloproliferative disorder and persistent leukocytosis and thrombocytosis with positive JAK 2 mutation. The patient was on treatment with hydroxyurea 500 mg p.o. daily status post 38 months. His treatment was a change it to 500 mg p.o. twice daily in May 2019, status post 16 months of treatment.  The patient has been tolerating this treatment well with no concerning adverse effects. The patient was seen with Dr. Julien Nordmann today. Labs were reviewed. His WBCs and platelets are within normal limits. Hemoglobin stable at 12.7. CMP is stable. LDH is pending. Dr. Julien Nordmann recommends that the patient continue on his  current treatment with hydroxyurea with the same dose. We will see him back for follow-up visit in 4 months for evaluation with repeat blood work. The patient requested a flu shot while in the clinic today. We will administer his flu shot per patient request. He was advised to call immediately if he has any concerning symptoms in the interval. The patient voices understanding of current disease status and treatment options and is in agreement with the current care plan. All questions were answered. The patient knows to call the clinic with any problems, questions or concerns. We can certainly see the patient much sooner if necessary.  Orders Placed This Encounter  Procedures  . CBC with Differential (Cancer Center Only)    Standing Status:   Future    Standing Expiration Date:   08/04/2020  . CMP ([facility] only)    Standing Status:   Future    Standing Expiration Date:   08/04/2020  . Lactate dehydrogenase (LDH)    Standing Status:   Future    Standing Expiration Date:   08/04/2020     Tobe Sos , PA-C 08/05/19  ADDENDUM: Hematology/Oncology Attending: I had a face-to-face encounter with the patient today.  I recommended his care plan.  This is a very pleasant 85 years old African-American male with myeloproliferative disorder presenting with essential thrombocythemia as well as leukocytosis and positive Jak 2 mutation. He is currently on treatment with hydroxyurea 500 mg p.o. twice daily.  He has been tolerating this treatment well with no concerning adverse effects. Repeat CBC today is unremarkable for any significant findings except for the mild anemia. I recommended for the patient to continue his current treatment with hydroxyurea with the same dose. I will see him back for follow-up visit and 4 months for evaluation with repeat blood work. He was advised to call immediately if he has any concerning symptoms in the interval.  Disclaimer: This note was dictated  with voice recognition software. Similar sounding words can inadvertently be transcribed and may be missed upon review. Eilleen Kempf, MD 08/05/19

## 2019-08-17 ENCOUNTER — Other Ambulatory Visit: Payer: Self-pay | Admitting: Internal Medicine

## 2019-09-09 ENCOUNTER — Other Ambulatory Visit: Payer: Self-pay | Admitting: Internal Medicine

## 2019-09-09 DIAGNOSIS — D471 Chronic myeloproliferative disease: Secondary | ICD-10-CM

## 2019-11-01 ENCOUNTER — Other Ambulatory Visit: Payer: Self-pay | Admitting: Family Medicine

## 2019-11-01 DIAGNOSIS — R944 Abnormal results of kidney function studies: Secondary | ICD-10-CM

## 2019-11-01 DIAGNOSIS — N183 Chronic kidney disease, stage 3 unspecified: Secondary | ICD-10-CM

## 2019-11-12 ENCOUNTER — Ambulatory Visit
Admission: RE | Admit: 2019-11-12 | Discharge: 2019-11-12 | Disposition: A | Discharge status: Home / Self Care | Payer: Medicare Other | Source: Ambulatory Visit | Attending: Family Medicine | Admitting: Family Medicine

## 2019-11-12 DIAGNOSIS — R944 Abnormal results of kidney function studies: Secondary | ICD-10-CM

## 2019-11-12 DIAGNOSIS — N183 Chronic kidney disease, stage 3 unspecified: Secondary | ICD-10-CM

## 2019-12-04 ENCOUNTER — Other Ambulatory Visit: Payer: Self-pay | Admitting: Internal Medicine

## 2019-12-04 DIAGNOSIS — D471 Chronic myeloproliferative disease: Secondary | ICD-10-CM

## 2019-12-04 NOTE — Progress Notes (Signed)
Adam Tapia OFFICE PROGRESS NOTE  [name], MD [place] [address]  DIAGNOSIS: Myeloproliferative disorder with positive JAK-2 mutation.  PRIOR THERAPY: None  CURRENT THERAPY: Hydrea 500 mg by mouth daily. Status post approximately 32months months of treatment.The dose of Hydrea was increased to 500 mg twice a day on 04/03/2018. Status post42monthsof this increased dose.  INTERVAL HISTORY: Adam Tapia 86 y.o. male returns to the clinic for a follow up visit. The patient is feeling well today without any concerning complaints. He denies any chest pain, shortness of breath, cough, and hemotysis. He denies any fever, chills, night sweats, or weight loss. He denies any nausea, vomiting, diarrhea, or constipation. He denies any headaches, visual changes, or dizziness. He denies any rashes or skin changes besides his normal psoriasis. He is tolerating his dose of Hydroxyurea 500 mg p.o BID well without any adverse side effects. He is here today for evaluation and repeat blood work.   MEDICAL HISTORY: Past Medical History:  Diagnosis Date  . Acute blood loss anemia   . AF (atrial fibrillation) ([place])   . Allergic rhinitis   . Anemia   . Arthritis   . BPH (benign prostatic hyperplasia)   . Constipation   . Dysrhythmia    PT STATES HAS HX A FIB - FOLLOWED BY DR.KEVIN LITTLE  . GERD (gastroesophageal reflux disease)    OCCASIONAL - HAS NEXIUM IF NEEDED - NOT CURRENTLY TAKING  . Hypertension   . Myeloproliferative disorder ([place])    FOLLOWED BY DR. Julien Nordmann  AT [place]  . Nocturia   . Primary osteoarthritis of left hip   . Psoriasis   . Unsteady gait     ALLERGIES:  is allergic to nsaids.  MEDICATIONS:  Current Outpatient Medications  Medication Sig Dispense Refill  . acetaminophen (TYLENOL) 325 MG tablet Take 650 mg by mouth every 6 (six) hours as needed for moderate pain.     Marland Kitchen allopurinol (ZYLOPRIM) 100 MG tablet TAKE 1 TABLET  BY MOUTH EVERY DAY 90 tablet 1  . amLODipine (NORVASC) 5 MG tablet Take 5 mg by mouth 2 (two) times daily.     Marland Kitchen atenolol (TENORMIN) 25 MG tablet Take 25 mg by mouth daily.  5  . candesartan (ATACAND) 8 MG tablet Take 8 mg by mouth daily.    Marland Kitchen esomeprazole (NEXIUM) 20 MG capsule     . fexofenadine (ALLEGRA) 180 MG tablet Take 180 mg by mouth daily.    . fluticasone (FLONASE) 50 MCG/ACT nasal spray Place 1 spray into both nostrils daily as needed for allergies.     . hydroxyurea (HYDREA) 500 MG capsule TAKE 1 CAPSULE BY MOUTH TWICE A DAY MAY TAKE WITH FOOD TO MINIMIZE SIDE EFFECTS 180 capsule 0  . indomethacin (INDOCIN) 50 MG capsule Take 1 capsule (50 mg total) by mouth 3 (three) times daily as needed. 21 capsule 0  . ketotifen (ZADITOR) 0.025 % ophthalmic solution 1 drop 2 (two) times daily.    Marland Kitchen triamterene-hydrochlorothiazide (MAXZIDE-25) 37.5-25 MG per tablet Take 0.5 tablets by mouth daily.     . TURMERIC PO Take 1 tablet by mouth daily.     No current facility-administered medications for this visit.    SURGICAL HISTORY:  Past Surgical History:  Procedure Laterality Date  . HERNIA REPAIR  1998  . TOTAL HIP ARTHROPLASTY Left 03/11/2016   Procedure: LEFT TOTAL HIP ARTHROPLASTY ANTERIOR APPROACH;  Surgeon: Mcarthur Rossetti, MD;  Location: [facility];  Service: Orthopedics;  Laterality: Left;  Went from Spinal to an LMA    REVIEW OF SYSTEMS:   Review of Systems  Constitutional: Negative for appetite change, chills, fatigue, fever and unexpected weight change.  HENT:   Negative for mouth sores, nosebleeds, sore throat and trouble swallowing.   Eyes: Negative for eye problems and icterus.  Respiratory: Negative for cough, hemoptysis, shortness of breath and wheezing.  Cardiovascular: Negative for chest pain and leg swelling.  Gastrointestinal: Negative for abdominal pain, constipation, diarrhea, nausea and vomiting.  Genitourinary: Negative for bladder incontinence, difficulty  urinating, dysuria, frequency and hematuria.   Musculoskeletal: Negative for back pain, gait problem, neck pain and neck stiffness.  Skin: Negative for itching and rash.  Neurological: Negative for dizziness, extremity weakness, gait problem, headaches, light-headedness and seizures.  Hematological: Negative for adenopathy. Does not bruise/bleed easily.  Psychiatric/Behavioral: Negative for confusion, depression and sleep disturbance. The patient is not nervous/anxious.     PHYSICAL EXAMINATION:  There were no vitals taken for this visit.  ECOG PERFORMANCE STATUS: 0 - Asymptomatic  Physical Exam  Constitutional: Oriented to person, place, and time and well-developed, well-nourished, and in no distress.   HENT:  Head: Normocephalic and atraumatic.  Mouth/Throat: Oropharynx is clear and moist. No oropharyngeal exudate.  Eyes: Conjunctivae are normal. Right eye exhibits no discharge. Left eye exhibits no discharge. No scleral icterus.  Neck: Normal range of motion. Neck supple.  Cardiovascular: Normal rate, regular rhythm, normal heart sounds and intact distal pulses.   Pulmonary/Chest: Effort normal and breath sounds normal. No respiratory distress. No wheezes. No rales.  Abdominal: Soft. Bowel sounds are normal. Exhibits no distension and no mass. There is no tenderness.  Musculoskeletal: Normal range of motion. Exhibits no edema.  Lymphadenopathy:    No cervical adenopathy.  Neurological: Alert and oriented to person, place, and time. Exhibits normal muscle tone. Gait normal. Coordination normal.  Skin: Skin is warm and dry. No rash noted. Not diaphoretic. No erythema. No pallor.  Psychiatric: Mood, memory and judgment normal.  Vitals reviewed.  LABORATORY DATA: Lab Results  Component Value Date   WBC 10.0 08/05/2019   HGB 12.7 (L) 08/05/2019   HCT 36.2 (L) 08/05/2019   MCV 126.1 (H) 08/05/2019   PLT 267 08/05/2019      Chemistry      Component Value Date/Time   NA 137  08/05/2019 1020   NA 138 10/05/2017 1018   K 3.9 08/05/2019 1020   K 4.4 10/05/2017 1018   CL 106 08/05/2019 1020   CO2 25 08/05/2019 1020   CO2 25 10/05/2017 1018   BUN 21 08/05/2019 1020   BUN 21.8 10/05/2017 1018   CREATININE 1.38 (H) 08/05/2019 1020   CREATININE 1.3 10/05/2017 1018   GLU 88 03/17/2016 0000      Component Value Date/Time   CALCIUM 9.4 08/05/2019 1020   CALCIUM 9.9 10/05/2017 1018   ALKPHOS 74 08/05/2019 1020   ALKPHOS 154 (H) 10/05/2017 1018   AST 16 08/05/2019 1020   AST 18 10/05/2017 1018   ALT 8 08/05/2019 1020   ALT 13 10/05/2017 1018   BILITOT 0.5 08/05/2019 1020   BILITOT 0.45 10/05/2017 1018       RADIOGRAPHIC STUDIES:  US RENAL  Result Date: Nov 30, 2019 CLINICAL DATA:  Stage 3 chronic kidney disease EXAM: RENAL / URINARY TRACT ULTRASOUND COMPLETE COMPARISON:  None. FINDINGS: Right Kidney: Renal measurements: 10.2 x 4.8 x 5.6 cm = volume: 142 mL. 1.7 cm cyst in the lower pole.  Normal echotexture. No hydronephrosis. Left Kidney: Renal measurements: 10.1 x 5.7 x 4.2 cm = volume: 129 mL. 6 cm simple appearing upper pole cyst. Normal echotexture. No hydronephrosis. Bladder: Appears normal for degree of bladder distention. Other: Prostate enlargement. IMPRESSION: Bilateral simple appearing renal cysts, the largest 6 cm off the upper pole of left kidney. No acute findings.  No hydronephrosis. Prostatomegaly Electronically Signed   By: Rolm Baptise M.D.   On: 11/12/2019 16:44     ASSESSMENT/PLAN:  This is a very pleasant 86year old African-American male with myeloproliferative disorder and persistent leukocytosis and thrombocytosis with positive JAK 2 mutation. The patient was on treatment with hydroxyurea 500 mg p.o. daily status post 38 months. His treatment was a change it to 500 mg p.o. twice daily in May 2019, status post34months of treatment.  The patient has been tolerating this treatment well with no concerning adverse effects. The patient was  seen with Dr. Julien Nordmann today. Labs were reviewed. His WBCs are 10.7 and platelets are WNL of 285. Hemoglobin stable at 12.9. CMP is stable. LDH is pending. Dr. Julien Nordmann recommends that the patient continue on his current treatment with hydroxyurea with the same dose. We will see him back for follow-up visitin3 months for evaluation with repeat blood work. He was advised to call immediately if he has any concerning symptoms in the interval. The patient voices understanding of current disease status and treatment options and is in agreement with the current care plan. All questions were answered. The patient knows to call the clinic with any problems, questions or concerns. We can certainly see the patient much sooner if necessary. No orders of the defined types were placed in this encounter.    Adam Eppard L Jeury Mcnab, PA-C 12/04/19  ADDENDUM: Hematology/Oncology Attending: I had a face to face encounter with the patient today.  I recommended his care plan.  This is a very pleasant 86 years old African-American male with history of myeloproliferative disorder/essential thrombocythemia.  He is currently undergoing treatment with hydroxyurea 1000 mg p.o. daily.  The patient is tolerating this treatment well with no concerning adverse effects. Repeat CBC today showed mildly elevated white blood count as well as mild anemia but the patient has normal platelets count. I recommended for him to continue his current treatment with hydroxyurea with the same dose. I will see him back for follow-up visit in 3 months for evaluation with repeat blood work. The patient was advised to call immediately if he has any concerning symptoms in the interval.  Disclaimer: This note was dictated with voice recognition software. Similar sounding words can inadvertently be transcribed and may be missed upon review. Eilleen Kempf, MD 12/05/19

## 2019-12-05 ENCOUNTER — Other Ambulatory Visit: Payer: Self-pay

## 2019-12-05 ENCOUNTER — Inpatient Hospital Stay: Payer: Medicare Other | Attending: Physician Assistant

## 2019-12-05 ENCOUNTER — Encounter: Payer: Self-pay | Admitting: Physician Assistant

## 2019-12-05 ENCOUNTER — Inpatient Hospital Stay (HOSPITAL_BASED_OUTPATIENT_CLINIC_OR_DEPARTMENT_OTHER): Payer: Medicare Other | Admitting: Physician Assistant

## 2019-12-05 VITALS — BP 139/77 | HR 60 | Temp 98.4°F | Resp 18 | Ht 68.0 in | Wt 198.3 lb

## 2019-12-05 DIAGNOSIS — D471 Chronic myeloproliferative disease: Secondary | ICD-10-CM

## 2019-12-05 DIAGNOSIS — I10 Essential (primary) hypertension: Secondary | ICD-10-CM | POA: Diagnosis not present

## 2019-12-05 DIAGNOSIS — Z79899 Other long term (current) drug therapy: Secondary | ICD-10-CM | POA: Insufficient documentation

## 2019-12-05 DIAGNOSIS — I4891 Unspecified atrial fibrillation: Secondary | ICD-10-CM | POA: Diagnosis not present

## 2019-12-05 DIAGNOSIS — C946 Myelodysplastic disease, not classified: Secondary | ICD-10-CM | POA: Insufficient documentation

## 2019-12-05 LAB — CMP (CANCER CENTER ONLY)
ALT: 11 U/L (ref 0–44)
AST: 16 U/L (ref 15–41)
Albumin: 3.6 g/dL (ref 3.5–5.0)
Alkaline Phosphatase: 82 U/L (ref 38–126)
Anion gap: 8 (ref 5–15)
BUN: 22 mg/dL (ref 8–23)
CO2: 25 mmol/L (ref 22–32)
Calcium: 9.2 mg/dL (ref 8.9–10.3)
Chloride: 106 mmol/L (ref 98–111)
Creatinine: 1.48 mg/dL — ABNORMAL HIGH (ref 0.61–1.24)
GFR, Est AFR Am: 46 mL/min — ABNORMAL LOW (ref >60)
GFR, Estimated: 40 mL/min — ABNORMAL LOW (ref >60)
Glucose, Bld: 96 mg/dL (ref 70–99)
Potassium: 4.2 mmol/L (ref 3.5–5.1)
Sodium: 139 mmol/L (ref 135–145)
Total Bilirubin: 0.5 mg/dL (ref 0.3–1.2)
Total Protein: 8.3 g/dL — ABNORMAL HIGH (ref 6.5–8.1)

## 2019-12-05 LAB — CBC WITH DIFFERENTIAL (CANCER CENTER ONLY)
Abs Immature Granulocytes: 0.31 10*3/uL — ABNORMAL HIGH (ref 0.00–0.07)
Basophils Absolute: 0.1 10*3/uL (ref 0.0–0.1)
Basophils Relative: 1 %
Eosinophils Absolute: 0 10*3/uL (ref 0.0–0.5)
Eosinophils Relative: 0 %
HCT: 37.2 % — ABNORMAL LOW (ref 39.0–52.0)
Hemoglobin: 12.9 g/dL — ABNORMAL LOW (ref 13.0–17.0)
Immature Granulocytes: 3 %
Lymphocytes Relative: 18 %
Lymphs Abs: 2 10*3/uL (ref 0.7–4.0)
MCH: 44.5 pg — ABNORMAL HIGH (ref 26.0–34.0)
MCHC: 34.7 g/dL (ref 30.0–36.0)
MCV: 128.3 fL — ABNORMAL HIGH (ref 80.0–100.0)
Monocytes Absolute: 1 10*3/uL (ref 0.1–1.0)
Monocytes Relative: 9 %
Neutro Abs: 7.4 10*3/uL (ref 1.7–7.7)
Neutrophils Relative %: 69 %
Platelet Count: 285 10*3/uL (ref 150–400)
RBC: 2.9 MIL/uL — ABNORMAL LOW (ref 4.22–5.81)
RDW: 11.9 % (ref 11.5–15.5)
WBC Count: 10.7 10*3/uL — ABNORMAL HIGH (ref 4.0–10.5)
nRBC: 0 % (ref 0.0–0.2)

## 2019-12-05 LAB — LACTATE DEHYDROGENASE: LDH: 162 U/L (ref 98–192)

## 2019-12-06 ENCOUNTER — Telehealth: Payer: Self-pay | Admitting: Internal Medicine

## 2019-12-06 NOTE — Telephone Encounter (Signed)
Scheduled per los. Called and left msg. Mailed printout  °

## 2020-01-23 ENCOUNTER — Ambulatory Visit: Payer: Medicare Other | Attending: Internal Medicine

## 2020-01-23 DIAGNOSIS — Z23 Encounter for immunization: Secondary | ICD-10-CM | POA: Insufficient documentation

## 2020-01-23 NOTE — Progress Notes (Signed)
   Covid-19 Vaccination Clinic  Name:  Adam Tapia    MRN: [id number] DOB: Mar 25, 1923  01/23/2020  [name] was observed post Covid-19 immunization for 15 minutes without incident. He was provided with Vaccine Information Sheet and instruction to access the V-Safe system.   [name] was instructed to call 911 with any severe reactions post vaccine: Marland Kitchen Difficulty breathing  . Swelling of face and throat  . A fast heartbeat  . A bad rash all over body  . Dizziness and weakness   Immunizations Administered    Name Date Dose VIS Date Route   Pfizer COVID-19 Vaccine 01/23/2020  1:16 PM 0.3 mL 11/01/2019 Intramuscular   Manufacturer: [place]   Lot: [id number]   [place]: [id number]

## 2020-02-14 ENCOUNTER — Other Ambulatory Visit: Payer: Self-pay | Admitting: Internal Medicine

## 2020-02-19 ENCOUNTER — Ambulatory Visit: Payer: Medicare Other | Attending: Internal Medicine

## 2020-02-19 DIAGNOSIS — Z23 Encounter for immunization: Secondary | ICD-10-CM

## 2020-02-19 NOTE — Progress Notes (Signed)
   Covid-19 Vaccination Clinic  Name:  Adam Tapia    MRN: [id number] DOB: [birth year]  02/19/2020  Mr. [name] was observed post Covid-19 immunization for 15 minutes without incident. He was provided with Vaccine Information Sheet and instruction to access the V-Safe system.   Mr. [name] was instructed to call 911 with any severe reactions post vaccine: Marland Kitchen Difficulty breathing  . Swelling of face and throat  . A fast heartbeat  . A bad rash all over body  . Dizziness and weakness   Immunizations Administered    Name Date Dose VIS Date Route   Pfizer COVID-19 Vaccine 02/19/2020 12:52 PM 0.3 mL 11/01/2019 Intramuscular   Manufacturer: [place]   Lot: [id number]   [place]: [id number]

## 2020-03-04 ENCOUNTER — Encounter: Payer: Self-pay | Admitting: Internal Medicine

## 2020-03-04 ENCOUNTER — Inpatient Hospital Stay: Payer: Medicare Other

## 2020-03-04 ENCOUNTER — Inpatient Hospital Stay: Payer: Medicare Other | Attending: Physician Assistant | Admitting: Internal Medicine

## 2020-03-04 ENCOUNTER — Other Ambulatory Visit: Payer: Self-pay

## 2020-03-04 VITALS — BP 132/70 | HR 60 | Temp 97.8°F | Resp 17 | Ht 68.0 in | Wt 202.7 lb

## 2020-03-04 DIAGNOSIS — D471 Chronic myeloproliferative disease: Secondary | ICD-10-CM | POA: Diagnosis not present

## 2020-03-04 DIAGNOSIS — Z79899 Other long term (current) drug therapy: Secondary | ICD-10-CM | POA: Diagnosis not present

## 2020-03-04 DIAGNOSIS — C946 Myelodysplastic disease, not classified: Secondary | ICD-10-CM | POA: Diagnosis not present

## 2020-03-04 DIAGNOSIS — G629 Polyneuropathy, unspecified: Secondary | ICD-10-CM | POA: Insufficient documentation

## 2020-03-04 LAB — CBC WITH DIFFERENTIAL (CANCER CENTER ONLY)
Abs Immature Granulocytes: 0.23 10*3/uL — ABNORMAL HIGH (ref 0.00–0.07)
Basophils Absolute: 0.1 10*3/uL (ref 0.0–0.1)
Basophils Relative: 1 %
Eosinophils Absolute: 0.1 10*3/uL (ref 0.0–0.5)
Eosinophils Relative: 0 %
HCT: 36.3 % — ABNORMAL LOW (ref 39.0–52.0)
Hemoglobin: 12.4 g/dL — ABNORMAL LOW (ref 13.0–17.0)
Immature Granulocytes: 2 %
Lymphocytes Relative: 18 %
Lymphs Abs: 2.2 10*3/uL (ref 0.7–4.0)
MCH: 42.5 pg — ABNORMAL HIGH (ref 26.0–34.0)
MCHC: 34.2 g/dL (ref 30.0–36.0)
MCV: 124.3 fL — ABNORMAL HIGH (ref 80.0–100.0)
Monocytes Absolute: 1.3 10*3/uL — ABNORMAL HIGH (ref 0.1–1.0)
Monocytes Relative: 11 %
Neutro Abs: 8.3 10*3/uL — ABNORMAL HIGH (ref 1.7–7.7)
Neutrophils Relative %: 68 %
Platelet Count: 259 10*3/uL (ref 150–400)
RBC: 2.92 MIL/uL — ABNORMAL LOW (ref 4.22–5.81)
RDW: 12.9 % (ref 11.5–15.5)
WBC Count: 12.1 10*3/uL — ABNORMAL HIGH (ref 4.0–10.5)
nRBC: 0 % (ref 0.0–0.2)

## 2020-03-04 LAB — CMP (CANCER CENTER ONLY)
ALT: 13 U/L (ref 0–44)
AST: 21 U/L (ref 15–41)
Albumin: 3.5 g/dL (ref 3.5–5.0)
Alkaline Phosphatase: 83 U/L (ref 38–126)
Anion gap: 7 (ref 5–15)
BUN: 29 mg/dL — ABNORMAL HIGH (ref 8–23)
CO2: 23 mmol/L (ref 22–32)
Calcium: 9.2 mg/dL (ref 8.9–10.3)
Chloride: 108 mmol/L (ref 98–111)
Creatinine: 1.61 mg/dL — ABNORMAL HIGH (ref 0.61–1.24)
GFR, Est AFR Am: 42 mL/min — ABNORMAL LOW (ref >60)
GFR, Estimated: 36 mL/min — ABNORMAL LOW (ref >60)
Glucose, Bld: 109 mg/dL — ABNORMAL HIGH (ref 70–99)
Potassium: 4.2 mmol/L (ref 3.5–5.1)
Sodium: 138 mmol/L (ref 135–145)
Total Bilirubin: 0.4 mg/dL (ref 0.3–1.2)
Total Protein: 8 g/dL (ref 6.5–8.1)

## 2020-03-04 NOTE — Progress Notes (Signed)
[facility] [phone]   Fax:(336) [place], MD [place] [address]  DIAGNOSIS: Myeloproliferative disorder with positive JAK-2 mutation.  PRIOR THERAPY: None.  CURRENT THERAPY: Hydrea 500 mg by mouth daily. Status post approximately 67months months of treatment.The dose of Hydrea was increased to 500 mg twice a day on 04/03/2018. Status post31monthsof this increased dose.  INTERVAL HISTORY: Adam Tapia 86 y.o. male returns to the clinic today for 52-month follow-up visit.  The patient is feeling fine today with no concerning complaints except for mild swelling in the lower extremities as well as numbness and tingling in the fingertips.  He also had one episode of left leg cramping last night and he took 2 Tylenol tablets with improvement of his symptoms.  The patient denied having any recent weight loss or night sweats.  He has no nausea, vomiting, diarrhea or constipation.  He has no headache or visual changes.  He has no bleeding issues.  He continues to tolerate his treatment with hydroxyurea fairly well.  Is here today for evaluation and repeat blood work.  ALLERGIES:  is allergic to nsaids.  MEDICATIONS:  Current Outpatient Medications  Medication Sig Dispense Refill  . acetaminophen (TYLENOL) 325 MG tablet Take 650 mg by mouth every 6 (six) hours as needed for moderate pain.     Marland Kitchen allopurinol (ZYLOPRIM) 100 MG tablet TAKE 1 TABLET BY MOUTH EVERY DAY 90 tablet 1  . amLODipine (NORVASC) 5 MG tablet Take 5 mg by mouth 2 (two) times daily.     Marland Kitchen amoxicillin (AMOXIL) 500 MG tablet Take 4 tablets by mouth as directed. 1 hour prior to dental appts.    Marland Kitchen atenolol (TENORMIN) 25 MG tablet Take 25 mg by mouth daily.  5  . candesartan (ATACAND) 8 MG tablet Take 8 mg by mouth daily.    Marland Kitchen esomeprazole (NEXIUM) 20 MG capsule     . fexofenadine (ALLEGRA) 180 MG tablet Take 180 mg by mouth daily.     . fluticasone (FLONASE) 50 MCG/ACT nasal spray Place 1 spray into both nostrils daily as needed for allergies.     . hydroxyurea (HYDREA) 500 MG capsule TAKE 1 CAPSULE BY MOUTH TWICE A DAY MAY TAKE WITH FOOD TO MINIMIZE SIDE EFFECTS 180 capsule 0  . indomethacin (INDOCIN) 50 MG capsule Take 1 capsule (50 mg total) by mouth 3 (three) times daily as needed. 21 capsule 0  . ketotifen (ZADITOR) 0.025 % ophthalmic solution 1 drop 2 (two) times daily.    Marland Kitchen triamterene-hydrochlorothiazide (MAXZIDE-25) 37.5-25 MG per tablet Take 0.5 tablets by mouth daily.     . TURMERIC PO Take 1 tablet by mouth daily.     No current facility-administered medications for this visit.    REVIEW OF SYSTEMS:  A comprehensive review of systems was negative except for: Constitutional: positive for fatigue Neurological: positive for paresthesia   PHYSICAL EXAMINATION: General appearance: alert, cooperative and no distress Head: Normocephalic, without obvious abnormality, atraumatic Neck: no adenopathy Lymph nodes: Cervical, supraclavicular, and axillary nodes normal. Resp: clear to auscultation bilaterally Back: symmetric, no curvature. ROM normal. No CVA tenderness. Cardio: regular rate and rhythm, S1, S2 normal, no murmur, click, rub or gallop GI: soft, non-tender; bowel sounds normal; no masses,  no organomegaly Extremities: extremities normal, atraumatic, no cyanosis or edema  ECOG PERFORMANCE STATUS: 1 - Symptomatic but completely ambulatory  Blood pressure 132/70, pulse 60, temperature 97.8 F (  36.6 C), temperature source Oral, resp. rate 17, height 5\' 8"  (1.727 m), weight 202 lb 11.2 oz (91.9 kg), SpO2 97 %.  LABORATORY DATA: Lab Results  Component Value Date   WBC 12.1 (H) 03/04/2020   HGB 12.4 (L) 03/04/2020   HCT 36.3 (L) 03/04/2020   MCV 124.3 (H) 03/04/2020   PLT 259 03/04/2020      Chemistry      Component Value Date/Time   NA 139 12/05/2019 1031   NA 138 10/05/2017 1018   K 4.2  12/05/2019 1031   K 4.4 10/05/2017 1018   CL 106 12/05/2019 1031   CO2 25 12/05/2019 1031   CO2 25 10/05/2017 1018   BUN 22 12/05/2019 1031   BUN 21.8 10/05/2017 1018   CREATININE 1.48 (H) 12/05/2019 1031   CREATININE 1.3 10/05/2017 1018   GLU 88 03/17/2016 0000      Component Value Date/Time   CALCIUM 9.2 12/05/2019 1031   CALCIUM 9.9 10/05/2017 1018   ALKPHOS 82 12/05/2019 1031   ALKPHOS 154 (H) 10/05/2017 1018   AST 16 12/05/2019 1031   AST 18 10/05/2017 1018   ALT 11 12/05/2019 1031   ALT 13 10/05/2017 1018   BILITOT 0.5 12/05/2019 1031   BILITOT 0.45 10/05/2017 1018       RADIOGRAPHIC STUDIES: No results found.  ASSESSMENT AND PLAN:  This is a very pleasant 86 years old African-American male with myeloproliferative disorder and persistent leukocytosis and thrombocytosis with positive JAK 2 mutation. The patient is currently on treatment with hydroxyurea 500 mg p.o. daily status post 38 months. His treatment was a change it to 500 mg p.o. twice daily in May 2019, status post 12 months of treatment. The patient has been tolerating this treatment well with no concerning adverse effects except for mild peripheral neuropathy. I recommended for him to continue his current treatment with the same dose for now. I will see him back for follow-up visit in 3 months for evaluation and repeat CBC, comprehensive metabolic panel and LDH. The patient was advised to call immediately if he has any concerning symptoms in the interval. The patient voices understanding of current disease status and treatment options and is in agreement with the current care plan. All questions were answered. The patient knows to call the clinic with any problems, questions or concerns. We can certainly see the patient much sooner if necessary.  Disclaimer: This note was dictated with voice recognition software. Similar sounding words can inadvertently be transcribed and may not be corrected upon review.

## 2020-03-09 ENCOUNTER — Telehealth: Payer: Self-pay | Admitting: Internal Medicine

## 2020-03-09 NOTE — Telephone Encounter (Signed)
Scheduled per los. Called and left msg. Mailed printout  °

## 2020-05-08 ENCOUNTER — Other Ambulatory Visit: Payer: Self-pay | Admitting: Internal Medicine

## 2020-05-08 DIAGNOSIS — D471 Chronic myeloproliferative disease: Secondary | ICD-10-CM

## 2020-06-03 ENCOUNTER — Inpatient Hospital Stay: Payer: Medicare Other

## 2020-06-03 ENCOUNTER — Encounter: Payer: Self-pay | Admitting: Internal Medicine

## 2020-06-03 ENCOUNTER — Inpatient Hospital Stay: Payer: Medicare Other | Attending: Physician Assistant | Admitting: Internal Medicine

## 2020-06-03 ENCOUNTER — Telehealth: Payer: Self-pay | Admitting: Internal Medicine

## 2020-06-03 ENCOUNTER — Other Ambulatory Visit: Payer: Self-pay

## 2020-06-03 VITALS — BP 124/52 | HR 61 | Temp 96.7°F | Resp 20 | Wt 200.7 lb

## 2020-06-03 DIAGNOSIS — R7989 Other specified abnormal findings of blood chemistry: Secondary | ICD-10-CM | POA: Insufficient documentation

## 2020-06-03 DIAGNOSIS — D72829 Elevated white blood cell count, unspecified: Secondary | ICD-10-CM | POA: Insufficient documentation

## 2020-06-03 DIAGNOSIS — D471 Chronic myeloproliferative disease: Secondary | ICD-10-CM

## 2020-06-03 DIAGNOSIS — Z79899 Other long term (current) drug therapy: Secondary | ICD-10-CM | POA: Diagnosis not present

## 2020-06-03 DIAGNOSIS — C946 Myelodysplastic disease, not classified: Secondary | ICD-10-CM | POA: Diagnosis present

## 2020-06-03 LAB — CMP (CANCER CENTER ONLY)
ALT: 10 U/L (ref 0–44)
AST: 18 U/L (ref 15–41)
Albumin: 3.5 g/dL (ref 3.5–5.0)
Alkaline Phosphatase: 69 U/L (ref 38–126)
Anion gap: 7 (ref 5–15)
BUN: 36 mg/dL — ABNORMAL HIGH (ref 8–23)
CO2: 22 mmol/L (ref 22–32)
Calcium: 9.3 mg/dL (ref 8.9–10.3)
Chloride: 109 mmol/L (ref 98–111)
Creatinine: 1.67 mg/dL — ABNORMAL HIGH (ref 0.61–1.24)
GFR, Est AFR Am: 40 mL/min — ABNORMAL LOW (ref >60)
GFR, Estimated: 34 mL/min — ABNORMAL LOW (ref >60)
Glucose, Bld: 105 mg/dL — ABNORMAL HIGH (ref 70–99)
Potassium: 4 mmol/L (ref 3.5–5.1)
Sodium: 138 mmol/L (ref 135–145)
Total Bilirubin: 0.5 mg/dL (ref 0.3–1.2)
Total Protein: 8.3 g/dL — ABNORMAL HIGH (ref 6.5–8.1)

## 2020-06-03 LAB — CBC WITH DIFFERENTIAL (CANCER CENTER ONLY)
Abs Immature Granulocytes: 0.21 10*3/uL — ABNORMAL HIGH (ref 0.00–0.07)
Basophils Absolute: 0.1 10*3/uL (ref 0.0–0.1)
Basophils Relative: 1 %
Eosinophils Absolute: 0.1 10*3/uL (ref 0.0–0.5)
Eosinophils Relative: 1 %
HCT: 36.3 % — ABNORMAL LOW (ref 39.0–52.0)
Hemoglobin: 12.6 g/dL — ABNORMAL LOW (ref 13.0–17.0)
Immature Granulocytes: 2 %
Lymphocytes Relative: 26 %
Lymphs Abs: 2.9 10*3/uL (ref 0.7–4.0)
MCH: 43.2 pg — ABNORMAL HIGH (ref 26.0–34.0)
MCHC: 34.7 g/dL (ref 30.0–36.0)
MCV: 124.3 fL — ABNORMAL HIGH (ref 80.0–100.0)
Monocytes Absolute: 0.8 10*3/uL (ref 0.1–1.0)
Monocytes Relative: 8 %
Neutro Abs: 6.8 10*3/uL (ref 1.7–7.7)
Neutrophils Relative %: 62 %
Platelet Count: 262 10*3/uL (ref 150–400)
RBC: 2.92 MIL/uL — ABNORMAL LOW (ref 4.22–5.81)
RDW: 12.5 % (ref 11.5–15.5)
WBC Count: 10.8 10*3/uL — ABNORMAL HIGH (ref 4.0–10.5)
nRBC: 0 % (ref 0.0–0.2)

## 2020-06-03 LAB — LACTATE DEHYDROGENASE: LDH: 179 U/L (ref 98–192)

## 2020-06-03 NOTE — Progress Notes (Signed)
[facility] [phone]   Fax:([phone]) [place], MD [place] [address]  DIAGNOSIS: Myeloproliferative disorder with positive JAK-2 mutation.  PRIOR THERAPY: None.  CURRENT THERAPY: Hydrea 500 mg by mouth daily. Status post approximately 39months months of treatment.The dose of Hydrea was increased to 500 mg twice a day on 04/03/2018. Status post67monthsof this increased dose.  INTERVAL HISTORY: Adam Tapia 86 y.o. male returns to the clinic today for follow-up visit.  The patient is feeling fine today with no concerning complaints.  He continues to tolerate his treatment with hydroxyurea fairly well.  He denied having any nausea, vomiting, diarrhea or constipation.  He denied having any alopecia.  He has no chest pain, shortness of breath, cough or hemoptysis.  He denied having any bleeding, bruises or ecchymosis.  He is here today for evaluation and repeat blood work.   ALLERGIES:  is allergic to nsaids.  MEDICATIONS:  Current Outpatient Medications  Medication Sig Dispense Refill  . acetaminophen (TYLENOL) 325 MG tablet Take 650 mg by mouth every 6 (six) hours as needed for moderate pain.     Marland Kitchen allopurinol (ZYLOPRIM) 100 MG tablet TAKE 1 TABLET BY MOUTH EVERY DAY 90 tablet 1  . amLODipine (NORVASC) 5 MG tablet Take 5 mg by mouth 2 (two) times daily.     Marland Kitchen amoxicillin (AMOXIL) 500 MG tablet Take 4 tablets by mouth as directed. 1 hour prior to dental appts.    Marland Kitchen atenolol (TENORMIN) 25 MG tablet Take 25 mg by mouth daily.  5  . candesartan (ATACAND) 8 MG tablet Take 8 mg by mouth daily.    Marland Kitchen esomeprazole (NEXIUM) 20 MG capsule     . fexofenadine (ALLEGRA) 180 MG tablet Take 180 mg by mouth daily.    . fluticasone (FLONASE) 50 MCG/ACT nasal spray Place 1 spray into both nostrils daily as needed for allergies.     . hydroxyurea (HYDREA) 500 MG capsule TAKE 1 CAPSULE BY MOUTH TWICE A DAY  MAY TAKE WITH FOOD TO MINIMIZE SIDE EFFECTS 180 capsule 0  . indomethacin (INDOCIN) 50 MG capsule Take 1 capsule (50 mg total) by mouth 3 (three) times daily as needed. 21 capsule 0  . ketotifen (ZADITOR) 0.025 % ophthalmic solution 1 drop 2 (two) times daily.    Marland Kitchen triamterene-hydrochlorothiazide (MAXZIDE-25) 37.5-25 MG per tablet Take 0.5 tablets by mouth daily.     . TURMERIC PO Take 1 tablet by mouth daily.     No current facility-administered medications for this visit.    REVIEW OF SYSTEMS:  A comprehensive review of systems was negative.   PHYSICAL EXAMINATION: General appearance: alert, cooperative and no distress Head: Normocephalic, without obvious abnormality, atraumatic Neck: no adenopathy Lymph nodes: Cervical, supraclavicular, and axillary nodes normal. Resp: clear to auscultation bilaterally Back: symmetric, no curvature. ROM normal. No CVA tenderness. Cardio: regular rate and rhythm, S1, S2 normal, no murmur, click, rub or gallop GI: soft, non-tender; bowel sounds normal; no masses,  no organomegaly Extremities: extremities normal, atraumatic, no cyanosis or edema  ECOG PERFORMANCE STATUS: 1 - Symptomatic but completely ambulatory  Blood pressure (!) 124/52, pulse 61, temperature (!) 96.7 F (35.9 C), temperature source Tympanic, resp. rate 20, weight 200 lb 11.2 oz (91 kg), SpO2 97 %.  LABORATORY DATA: Lab Results  Component Value Date   WBC 10.8 (H) 06/03/2020   HGB 12.6 (L) 06/03/2020   HCT 36.3 (L) 06/03/2020  MCV 124.3 (H) 06/03/2020   PLT 262 06/03/2020      Chemistry      Component Value Date/Time   NA 138 03/04/2020 0951   NA 138 10/05/2017 1018   K 4.2 03/04/2020 0951   K 4.4 10/05/2017 1018   CL 108 03/04/2020 0951   CO2 23 03/04/2020 0951   CO2 25 10/05/2017 1018   BUN 29 (H) 03/04/2020 0951   BUN 21.8 10/05/2017 1018   CREATININE 1.61 (H) 03/04/2020 0951   CREATININE 1.3 10/05/2017 1018   GLU 88 03/17/2016 0000      Component Value  Date/Time   CALCIUM 9.2 03/04/2020 0951   CALCIUM 9.9 10/05/2017 1018   ALKPHOS 83 03/04/2020 0951   ALKPHOS 154 (H) 10/05/2017 1018   AST 21 03/04/2020 0951   AST 18 10/05/2017 1018   ALT 13 03/04/2020 0951   ALT 13 10/05/2017 1018   BILITOT 0.4 03/04/2020 0951   BILITOT 0.45 10/05/2017 1018       RADIOGRAPHIC STUDIES: No results found.  ASSESSMENT AND PLAN:  This is a very pleasant 86 years old African-American male with myeloproliferative disorder and persistent leukocytosis and thrombocytosis with positive JAK 2 mutation. The patient is currently on treatment with hydroxyurea 500 mg p.o. daily status post 38 months. His treatment was a change it to 500 mg p.o. twice daily in May 2019, status post 26 months of treatment. The patient has been tolerating this treatment well with no concerning adverse effects. I recommended for him to continue with the same dose of hydroxyurea 500 mg p.o. twice daily. He will come back for follow-up visit and 3 months for evaluation and repeat blood work. He was advised to call immediately if he has any concerning symptoms in the interval. The patient voices understanding of current disease status and treatment options and is in agreement with the current care plan. All questions were answered. The patient knows to call the clinic with any problems, questions or concerns. We can certainly see the patient much sooner if necessary.  Disclaimer: This note was dictated with voice recognition software. Similar sounding words can inadvertently be transcribed and may not be corrected upon review.

## 2020-06-03 NOTE — Telephone Encounter (Signed)
Scheduled per 07/14 los, patient received updated calender.  

## 2020-07-31 ENCOUNTER — Other Ambulatory Visit: Payer: Self-pay | Admitting: Internal Medicine

## 2020-07-31 DIAGNOSIS — D471 Chronic myeloproliferative disease: Secondary | ICD-10-CM

## 2020-08-17 ENCOUNTER — Other Ambulatory Visit: Payer: Self-pay | Admitting: Internal Medicine

## 2020-09-02 ENCOUNTER — Inpatient Hospital Stay: Payer: Medicare Other | Attending: Physician Assistant

## 2020-09-02 ENCOUNTER — Telehealth: Payer: Self-pay | Admitting: Internal Medicine

## 2020-09-02 ENCOUNTER — Inpatient Hospital Stay (HOSPITAL_BASED_OUTPATIENT_CLINIC_OR_DEPARTMENT_OTHER): Payer: Medicare Other | Admitting: Internal Medicine

## 2020-09-02 ENCOUNTER — Encounter: Payer: Self-pay | Admitting: Internal Medicine

## 2020-09-02 ENCOUNTER — Other Ambulatory Visit: Payer: Self-pay

## 2020-09-02 VITALS — BP 122/57 | HR 55 | Temp 97.6°F | Resp 18 | Ht 68.0 in | Wt 203.5 lb

## 2020-09-02 DIAGNOSIS — Z79899 Other long term (current) drug therapy: Secondary | ICD-10-CM | POA: Diagnosis not present

## 2020-09-02 DIAGNOSIS — D72829 Elevated white blood cell count, unspecified: Secondary | ICD-10-CM | POA: Insufficient documentation

## 2020-09-02 DIAGNOSIS — D75839 Thrombocytosis, unspecified: Secondary | ICD-10-CM | POA: Diagnosis not present

## 2020-09-02 DIAGNOSIS — D471 Chronic myeloproliferative disease: Secondary | ICD-10-CM | POA: Insufficient documentation

## 2020-09-02 LAB — CBC WITH DIFFERENTIAL (CANCER CENTER ONLY)
Abs Immature Granulocytes: 0.35 10*3/uL — ABNORMAL HIGH (ref 0.00–0.07)
Basophils Absolute: 0.1 10*3/uL (ref 0.0–0.1)
Basophils Relative: 1 %
Eosinophils Absolute: 0 10*3/uL (ref 0.0–0.5)
Eosinophils Relative: 0 %
HCT: 34.7 % — ABNORMAL LOW (ref 39.0–52.0)
Hemoglobin: 12.2 g/dL — ABNORMAL LOW (ref 13.0–17.0)
Immature Granulocytes: 4 %
Lymphocytes Relative: 29 %
Lymphs Abs: 2.6 10*3/uL (ref 0.7–4.0)
MCH: 44.5 pg — ABNORMAL HIGH (ref 26.0–34.0)
MCHC: 35.2 g/dL (ref 30.0–36.0)
MCV: 126.6 fL — ABNORMAL HIGH (ref 80.0–100.0)
Monocytes Absolute: 0.9 10*3/uL (ref 0.1–1.0)
Monocytes Relative: 10 %
Neutro Abs: 5.1 10*3/uL (ref 1.7–7.7)
Neutrophils Relative %: 56 %
Platelet Count: 249 10*3/uL (ref 150–400)
RBC: 2.74 MIL/uL — ABNORMAL LOW (ref 4.22–5.81)
RDW: 12.8 % (ref 11.5–15.5)
WBC Count: 9 10*3/uL (ref 4.0–10.5)
nRBC: 0 % (ref 0.0–0.2)

## 2020-09-02 LAB — CMP (CANCER CENTER ONLY)
ALT: 11 U/L (ref 0–44)
AST: 17 U/L (ref 15–41)
Albumin: 3.4 g/dL — ABNORMAL LOW (ref 3.5–5.0)
Alkaline Phosphatase: 66 U/L (ref 38–126)
Anion gap: 2 — ABNORMAL LOW (ref 5–15)
BUN: 23 mg/dL (ref 8–23)
CO2: 27 mmol/L (ref 22–32)
Calcium: 9.9 mg/dL (ref 8.9–10.3)
Chloride: 108 mmol/L (ref 98–111)
Creatinine: 1.2 mg/dL (ref 0.61–1.24)
GFR, Estimated: 51 mL/min — ABNORMAL LOW (ref >60)
Glucose, Bld: 91 mg/dL (ref 70–99)
Potassium: 3.8 mmol/L (ref 3.5–5.1)
Sodium: 137 mmol/L (ref 135–145)
Total Bilirubin: 0.3 mg/dL (ref 0.3–1.2)
Total Protein: 8 g/dL (ref 6.5–8.1)

## 2020-09-02 LAB — LACTATE DEHYDROGENASE: LDH: 206 U/L — ABNORMAL HIGH (ref 98–192)

## 2020-09-02 NOTE — Progress Notes (Signed)
[facility] Telephone:(336) [phone]   Fax:(336) [place], MD [place] [address]  DIAGNOSIS: Myeloproliferative disorder with positive JAK-2 mutation.  PRIOR THERAPY: None.  CURRENT THERAPY: Hydrea 500 mg by mouth daily. Status post approximately 64months months of treatment.The dose of Hydrea was increased to 500 mg twice a day on 04/03/2018. Status post82monthsof this increased dose.  INTERVAL HISTORY: Adam Tapia 86 y.o. male returns to the clinic today for follow-up visit.  The patient is feeling fine today with no concerning complaints except for mild fatigue.  He continues to tolerate his treatment with hydroxyurea fairly well.  He denied having any chest pain, shortness of breath, cough or hemoptysis.  He denied having any fever or chills.  He has no nausea, vomiting, diarrhea or constipation.  The patient has no bleeding, bruises or ecchymosis.  He is here today for evaluation and repeat blood work.  ALLERGIES:  is allergic to nsaids.  MEDICATIONS:  Current Outpatient Medications  Medication Sig Dispense Refill  . acetaminophen (TYLENOL) 325 MG tablet Take 650 mg by mouth every 6 (six) hours as needed for moderate pain.     Marland Kitchen allopurinol (ZYLOPRIM) 100 MG tablet TAKE 1 TABLET BY MOUTH EVERY DAY 90 tablet 1  . amLODipine (NORVASC) 5 MG tablet Take 5 mg by mouth 2 (two) times daily.     Marland Kitchen amoxicillin (AMOXIL) 500 MG tablet Take 4 tablets by mouth as directed. 1 hour prior to dental appts.    Marland Kitchen atenolol (TENORMIN) 25 MG tablet Take 25 mg by mouth daily.  5  . candesartan (ATACAND) 8 MG tablet Take 8 mg by mouth daily.    Marland Kitchen esomeprazole (NEXIUM) 20 MG capsule     . fexofenadine (ALLEGRA) 180 MG tablet Take 180 mg by mouth daily.    . fluticasone (FLONASE) 50 MCG/ACT nasal spray Place 1 spray into both nostrils daily as needed for allergies.     . hydroxyurea (HYDREA) 500 MG capsule TAKE 1  CAPSULE BY MOUTH TWICE A DAY MAY TAKE WITH FOOD TO MINIMIZE SIDE EFFECTS 180 capsule 0  . indomethacin (INDOCIN) 50 MG capsule Take 1 capsule (50 mg total) by mouth 3 (three) times daily as needed. 21 capsule 0  . ketotifen (ZADITOR) 0.025 % ophthalmic solution 1 drop 2 (two) times daily.    Marland Kitchen triamterene-hydrochlorothiazide (MAXZIDE-25) 37.5-25 MG per tablet Take 0.5 tablets by mouth daily.     . TURMERIC PO Take 1 tablet by mouth daily.     No current facility-administered medications for this visit.    REVIEW OF SYSTEMS:  A comprehensive review of systems was negative except for: Constitutional: positive for fatigue   PHYSICAL EXAMINATION: General appearance: alert, cooperative and no distress Head: Normocephalic, without obvious abnormality, atraumatic Neck: no adenopathy Lymph nodes: Cervical, supraclavicular, and axillary nodes normal. Resp: clear to auscultation bilaterally Back: symmetric, no curvature. ROM normal. No CVA tenderness. Cardio: regular rate and rhythm, S1, S2 normal, no murmur, click, rub or gallop GI: soft, non-tender; bowel sounds normal; no masses,  no organomegaly Extremities: extremities normal, atraumatic, no cyanosis or edema  ECOG PERFORMANCE STATUS: 1 - Symptomatic but completely ambulatory  Blood pressure (!) 122/57, pulse (!) 55, temperature 97.6 F (36.4 C), temperature source Tympanic, resp. rate 18, height 5\' 8"  (1.727 m), weight 203 lb 8 oz (92.3 kg), SpO2 96 %.  LABORATORY DATA: Lab Results  Component Value Date  WBC 9.0 09/02/2020   HGB 12.2 (L) 09/02/2020   HCT 34.7 (L) 09/02/2020   MCV 126.6 (H) 09/02/2020   PLT 249 09/02/2020      Chemistry      Component Value Date/Time   NA 137 09/02/2020 0919   NA 138 10/05/2017 1018   K 3.8 09/02/2020 0919   K 4.4 10/05/2017 1018   CL 108 09/02/2020 0919   CO2 27 09/02/2020 0919   CO2 25 10/05/2017 1018   BUN 23 09/02/2020 0919   BUN 21.8 10/05/2017 1018   CREATININE 1.20 09/02/2020 0919    CREATININE 1.3 10/05/2017 1018   GLU 88 03/17/2016 0000      Component Value Date/Time   CALCIUM 9.9 09/02/2020 0919   CALCIUM 9.9 10/05/2017 1018   ALKPHOS 66 09/02/2020 0919   ALKPHOS 154 (H) 10/05/2017 1018   AST 17 09/02/2020 0919   AST 18 10/05/2017 1018   ALT 11 09/02/2020 0919   ALT 13 10/05/2017 1018   BILITOT 0.3 09/02/2020 0919   BILITOT 0.45 10/05/2017 1018       RADIOGRAPHIC STUDIES: No results found.  ASSESSMENT AND PLAN:  This is a very pleasant 86 years old African-American male with myeloproliferative disorder and persistent leukocytosis and thrombocytosis with positive JAK 2 mutation. The patient is currently on treatment with hydroxyurea 500 mg p.o. daily status post 38 months. His treatment was a change it to 500 mg p.o. twice daily in May 2019, status post 29 months of treatment. He continues to tolerate his treatment with hydroxyurea fairly well. Repeat blood work today showed no concerning findings and his platelets count, white blood count and Hemoglobin and hematocrit are acceptable. I recommended for him to continue his current treatment with hydroxyurea with the same dose. I will see him back for follow-up visit in 3 months for evaluation and repeat blood work. The patient was advised to call immediately if he has any concerning symptoms in the interval. The patient voices understanding of current disease status and treatment options and is in agreement with the current care plan. All questions were answered. The patient knows to call the clinic with any problems, questions or concerns. We can certainly see the patient much sooner if necessary.  Disclaimer: This note was dictated with voice recognition software. Similar sounding words can inadvertently be transcribed and may not be corrected upon review.

## 2020-09-02 NOTE — Telephone Encounter (Signed)
Scheduled appointment per 10/13 los. Spoke to patient who is aware of appointments dates and times.

## 2020-10-29 ENCOUNTER — Other Ambulatory Visit: Payer: Self-pay | Admitting: Internal Medicine

## 2020-10-29 DIAGNOSIS — D471 Chronic myeloproliferative disease: Secondary | ICD-10-CM

## 2020-12-03 ENCOUNTER — Ambulatory Visit: Payer: Medicare Other | Admitting: Internal Medicine

## 2020-12-03 ENCOUNTER — Other Ambulatory Visit: Payer: Medicare Other

## 2020-12-29 DIAGNOSIS — I10 Essential (primary) hypertension: Secondary | ICD-10-CM | POA: Diagnosis not present

## 2020-12-29 DIAGNOSIS — H35033 Hypertensive retinopathy, bilateral: Secondary | ICD-10-CM | POA: Diagnosis not present

## 2020-12-29 DIAGNOSIS — N183 Chronic kidney disease, stage 3 unspecified: Secondary | ICD-10-CM | POA: Diagnosis not present

## 2021-01-28 ENCOUNTER — Inpatient Hospital Stay: Payer: Medicare Other | Attending: Internal Medicine

## 2021-01-28 ENCOUNTER — Other Ambulatory Visit: Payer: Self-pay

## 2021-01-28 ENCOUNTER — Inpatient Hospital Stay (HOSPITAL_BASED_OUTPATIENT_CLINIC_OR_DEPARTMENT_OTHER): Payer: Medicare Other | Admitting: Internal Medicine

## 2021-01-28 VITALS — BP 129/67 | HR 63 | Temp 96.7°F | Resp 13 | Ht 68.0 in | Wt 203.8 lb

## 2021-01-28 DIAGNOSIS — D471 Chronic myeloproliferative disease: Secondary | ICD-10-CM

## 2021-01-28 DIAGNOSIS — C946 Myelodysplastic disease, not classified: Secondary | ICD-10-CM | POA: Insufficient documentation

## 2021-01-28 DIAGNOSIS — D649 Anemia, unspecified: Secondary | ICD-10-CM | POA: Insufficient documentation

## 2021-01-28 LAB — CBC WITH DIFFERENTIAL (CANCER CENTER ONLY)
Basophils Absolute: 0.1 10*3/uL (ref 0.0–0.1)
Basophils Relative: 1 %
Eosinophils Absolute: 0 10*3/uL (ref 0.0–0.5)
Eosinophils Relative: 0 %
HCT: 35.5 % — ABNORMAL LOW (ref 39.0–52.0)
Hemoglobin: 12.6 g/dL — ABNORMAL LOW (ref 13.0–17.0)
Lymphocytes Relative: 26 %
Lymphs Abs: 2.2 10*3/uL (ref 0.7–4.0)
MCH: 45 pg — ABNORMAL HIGH (ref 26.0–34.0)
MCHC: 35.5 g/dL (ref 30.0–36.0)
MCV: 126.8 fL — ABNORMAL HIGH (ref 80.0–100.0)
Monocytes Absolute: 0.8 10*3/uL (ref 0.1–1.0)
Monocytes Relative: 10 %
Neutro Abs: 4.7 10*3/uL (ref 1.7–7.7)
Neutrophils Relative %: 56 %
Platelet Count: 268 10*3/uL (ref 150–400)
RBC: 2.8 MIL/uL — ABNORMAL LOW (ref 4.22–5.81)
RDW: 12.6 % (ref 11.5–15.5)
WBC Count: 8.3 10*3/uL (ref 4.0–10.5)
nRBC: 0 % (ref 0.0–0.2)

## 2021-01-28 LAB — LACTATE DEHYDROGENASE: LDH: 180 U/L (ref 98–192)

## 2021-01-28 LAB — CMP (CANCER CENTER ONLY)
ALT: 13 U/L (ref 0–44)
AST: 21 U/L (ref 15–41)
Albumin: 3.6 g/dL (ref 3.5–5.0)
Alkaline Phosphatase: 68 U/L (ref 38–126)
Anion gap: 6 (ref 5–15)
BUN: 28 mg/dL — ABNORMAL HIGH (ref 8–23)
CO2: 21 mmol/L — ABNORMAL LOW (ref 22–32)
Calcium: 9.1 mg/dL (ref 8.9–10.3)
Chloride: 109 mmol/L (ref 98–111)
Creatinine: 1.42 mg/dL — ABNORMAL HIGH (ref 0.61–1.24)
GFR, Estimated: 46 mL/min — ABNORMAL LOW (ref >60)
Glucose, Bld: 98 mg/dL (ref 70–99)
Potassium: 4 mmol/L (ref 3.5–5.1)
Sodium: 136 mmol/L (ref 135–145)
Total Bilirubin: 0.4 mg/dL (ref 0.3–1.2)
Total Protein: 8.1 g/dL (ref 6.5–8.1)

## 2021-01-28 NOTE — Progress Notes (Signed)
[facility] [phone]   Fax:(336) [place], MD [place] [address]  DIAGNOSIS: Myeloproliferative disorder with positive JAK-2 mutation.  PRIOR THERAPY: None.  CURRENT THERAPY: Hydrea 500 mg by mouth daily. Status post approximately 3months months of treatment.The dose of Hydrea was increased to 500 mg twice a day on 04/03/2018. Status post59monthsof this increased dose.  INTERVAL HISTORY: Adam Tapia 87 y.o. male returns to the clinic today for follow-up visit.  The patient is feeling fine today with no concerning complaints.  He denied having any current chest pain, shortness of breath, cough or hemoptysis.  He denied having any fever or chills.  He has no nausea, vomiting, diarrhea or constipation.  He has no headache or visual changes.  He continues to tolerate his treatment with hydroxyurea fairly well.  The patient is here today for evaluation and repeat blood work.  ALLERGIES:  is allergic to nsaids.  MEDICATIONS:  Current Outpatient Medications  Medication Sig Dispense Refill  . acetaminophen (TYLENOL) 325 MG tablet Take 650 mg by mouth every 6 (six) hours as needed for moderate pain.     Marland Kitchen allopurinol (ZYLOPRIM) 100 MG tablet TAKE 1 TABLET BY MOUTH EVERY DAY 90 tablet 1  . amLODipine (NORVASC) 5 MG tablet Take 5 mg by mouth 2 (two) times daily.     Marland Kitchen amoxicillin (AMOXIL) 500 MG tablet Take 4 tablets by mouth as directed. 1 hour prior to dental appts.    Marland Kitchen atenolol (TENORMIN) 25 MG tablet Take 25 mg by mouth daily.  5  . candesartan (ATACAND) 8 MG tablet Take 8 mg by mouth daily.    Marland Kitchen esomeprazole (NEXIUM) 20 MG capsule     . fexofenadine (ALLEGRA) 180 MG tablet Take 180 mg by mouth daily.    . fluticasone (FLONASE) 50 MCG/ACT nasal spray Place 1 spray into both nostrils daily as needed for allergies.     . hydroxyurea (HYDREA) 500 MG capsule TAKE 1 CAPSULE BY MOUTH TWICE A  DAY MAY TAKE WITH FOOD TO MINIMIZE SIDE EFFECTS 180 capsule 0  . indomethacin (INDOCIN) 50 MG capsule Take 1 capsule (50 mg total) by mouth 3 (three) times daily as needed. 21 capsule 0  . ketotifen (ZADITOR) 0.025 % ophthalmic solution 1 drop 2 (two) times daily.    Marland Kitchen triamterene-hydrochlorothiazide (MAXZIDE-25) 37.5-25 MG per tablet Take 0.5 tablets by mouth daily.     . TURMERIC PO Take 1 tablet by mouth daily.     No current facility-administered medications for this visit.    REVIEW OF SYSTEMS:  A comprehensive review of systems was negative except for: Constitutional: positive for fatigue   PHYSICAL EXAMINATION: General appearance: alert, cooperative and no distress Head: Normocephalic, without obvious abnormality, atraumatic Neck: no adenopathy Lymph nodes: Cervical, supraclavicular, and axillary nodes normal. Resp: clear to auscultation bilaterally Back: symmetric, no curvature. ROM normal. No CVA tenderness. Cardio: regular rate and rhythm, S1, S2 normal, no murmur, click, rub or gallop GI: soft, non-tender; bowel sounds normal; no masses,  no organomegaly Extremities: extremities normal, atraumatic, no cyanosis or edema  ECOG PERFORMANCE STATUS: 1 - Symptomatic but completely ambulatory  Blood pressure 129/67, pulse 63, temperature (!) 96.7 F (35.9 C), resp. rate 13, height 5\' 8"  (1.727 m), weight 203 lb 12.8 oz (92.4 kg), SpO2 100 %.  LABORATORY DATA: Lab Results  Component Value Date   WBC 8.3 01/28/2021   HGB 12.6 (  L) 01/28/2021   HCT 35.5 (L) 01/28/2021   MCV 126.8 (H) 01/28/2021   PLT 268 01/28/2021      Chemistry      Component Value Date/Time   NA 136 01/28/2021 0951   NA 138 10/05/2017 1018   K 4.0 01/28/2021 0951   K 4.4 10/05/2017 1018   CL 109 01/28/2021 0951   CO2 21 (L) 01/28/2021 0951   CO2 25 10/05/2017 1018   BUN 28 (H) 01/28/2021 0951   BUN 21.8 10/05/2017 1018   CREATININE 1.42 (H) 01/28/2021 0951   CREATININE 1.3 10/05/2017 1018   GLU 88  03/17/2016 0000      Component Value Date/Time   CALCIUM 9.1 01/28/2021 0951   CALCIUM 9.9 10/05/2017 1018   ALKPHOS 68 01/28/2021 0951   ALKPHOS 154 (H) 10/05/2017 1018   AST 21 01/28/2021 0951   AST 18 10/05/2017 1018   ALT 13 01/28/2021 0951   ALT 13 10/05/2017 1018   BILITOT 0.4 01/28/2021 0951   BILITOT 0.45 10/05/2017 1018       RADIOGRAPHIC STUDIES: No results found.  ASSESSMENT AND PLAN:  This is a very pleasant 87 years old African-American male with myeloproliferative disorder and persistent leukocytosis and thrombocytosis with positive JAK 2 mutation. The patient is currently on treatment with hydroxyurea 500 mg p.o. daily status post 38 months. His treatment was changed to 500 mg p.o. twice daily in May 2019, status post 32 months of treatment. The patient continues to tolerate this treatment well with no concerning complaints. Repeat CBC, comprehensive metabolic panel and LDH are stable with no concerning findings except for the mild anemia. I recommended for the patient to continue his current treatment with hydroxyurea 500 mg p.o. twice daily. I will see him back for follow-up visit in 3 months for evaluation and repeat blood work. The patient was advised to call immediately if he has any concerning symptoms in the interval. The patient voices understanding of current disease status and treatment options and is in agreement with the current care plan. All questions were answered. The patient knows to call the clinic with any problems, questions or concerns. We can certainly see the patient much sooner if necessary.  Disclaimer: This note was dictated with voice recognition software. Similar sounding words can inadvertently be transcribed and may not be corrected upon review.

## 2021-01-29 ENCOUNTER — Other Ambulatory Visit: Payer: Self-pay | Admitting: Internal Medicine

## 2021-01-29 DIAGNOSIS — D471 Chronic myeloproliferative disease: Secondary | ICD-10-CM

## 2021-02-01 ENCOUNTER — Telehealth: Payer: Self-pay | Admitting: Internal Medicine

## 2021-02-01 NOTE — Telephone Encounter (Signed)
Scheduled per los. Called and spoke with patients wife. Confirmed appt  

## 2021-02-10 ENCOUNTER — Other Ambulatory Visit: Payer: Self-pay | Admitting: Internal Medicine

## 2021-04-26 ENCOUNTER — Other Ambulatory Visit: Payer: Self-pay | Admitting: Internal Medicine

## 2021-04-26 DIAGNOSIS — D471 Chronic myeloproliferative disease: Secondary | ICD-10-CM

## 2021-05-04 ENCOUNTER — Other Ambulatory Visit: Payer: Self-pay

## 2021-05-04 ENCOUNTER — Inpatient Hospital Stay: Payer: Medicare Other | Attending: Internal Medicine | Admitting: Internal Medicine

## 2021-05-04 ENCOUNTER — Inpatient Hospital Stay: Payer: Medicare Other

## 2021-05-04 VITALS — BP 116/58 | HR 53 | Temp 97.6°F | Resp 19 | Ht 68.0 in | Wt 203.4 lb

## 2021-05-04 DIAGNOSIS — D75839 Thrombocytosis, unspecified: Secondary | ICD-10-CM | POA: Diagnosis not present

## 2021-05-04 DIAGNOSIS — D649 Anemia, unspecified: Secondary | ICD-10-CM | POA: Insufficient documentation

## 2021-05-04 DIAGNOSIS — Z79899 Other long term (current) drug therapy: Secondary | ICD-10-CM | POA: Insufficient documentation

## 2021-05-04 DIAGNOSIS — D72829 Elevated white blood cell count, unspecified: Secondary | ICD-10-CM | POA: Insufficient documentation

## 2021-05-04 DIAGNOSIS — R350 Frequency of micturition: Secondary | ICD-10-CM | POA: Insufficient documentation

## 2021-05-04 DIAGNOSIS — D471 Chronic myeloproliferative disease: Secondary | ICD-10-CM

## 2021-05-04 LAB — CBC WITH DIFFERENTIAL (CANCER CENTER ONLY)
Abs Immature Granulocytes: 0 10*3/uL (ref 0.00–0.07)
Basophils Absolute: 0.1 10*3/uL (ref 0.0–0.1)
Basophils Relative: 1 %
Eosinophils Absolute: 0.1 10*3/uL (ref 0.0–0.5)
Eosinophils Relative: 1 %
HCT: 33.3 % — ABNORMAL LOW (ref 39.0–52.0)
Hemoglobin: 11.8 g/dL — ABNORMAL LOW (ref 13.0–17.0)
Lymphocytes Relative: 32 %
Lymphs Abs: 2.6 10*3/uL (ref 0.7–4.0)
MCH: 45.4 pg — ABNORMAL HIGH (ref 26.0–34.0)
MCHC: 35.4 g/dL (ref 30.0–36.0)
MCV: 128.1 fL — ABNORMAL HIGH (ref 80.0–100.0)
Monocytes Absolute: 0.7 10*3/uL (ref 0.1–1.0)
Monocytes Relative: 9 %
Neutro Abs: 4.6 10*3/uL (ref 1.7–7.7)
Neutrophils Relative %: 57 %
Platelet Count: 240 10*3/uL (ref 150–400)
RBC: 2.6 MIL/uL — ABNORMAL LOW (ref 4.22–5.81)
RDW: 12.2 % (ref 11.5–15.5)
WBC Count: 8 10*3/uL (ref 4.0–10.5)
nRBC: 0 % (ref 0.0–0.2)

## 2021-05-04 LAB — CMP (CANCER CENTER ONLY)
ALT: 9 U/L (ref 0–44)
AST: 17 U/L (ref 15–41)
Albumin: 3.3 g/dL — ABNORMAL LOW (ref 3.5–5.0)
Alkaline Phosphatase: 61 U/L (ref 38–126)
Anion gap: 8 (ref 5–15)
BUN: 32 mg/dL — ABNORMAL HIGH (ref 8–23)
CO2: 23 mmol/L (ref 22–32)
Calcium: 9.2 mg/dL (ref 8.9–10.3)
Chloride: 108 mmol/L (ref 98–111)
Creatinine: 1.74 mg/dL — ABNORMAL HIGH (ref 0.61–1.24)
GFR, Estimated: 36 mL/min — ABNORMAL LOW (ref >60)
Glucose, Bld: 153 mg/dL — ABNORMAL HIGH (ref 70–99)
Potassium: 3.9 mmol/L (ref 3.5–5.1)
Sodium: 139 mmol/L (ref 135–145)
Total Bilirubin: 0.4 mg/dL (ref 0.3–1.2)
Total Protein: 7.8 g/dL (ref 6.5–8.1)

## 2021-05-04 LAB — LACTATE DEHYDROGENASE: LDH: 187 U/L (ref 98–192)

## 2021-05-04 NOTE — Progress Notes (Signed)
[facility] [phone]   Fax:(336) [place], MD [place] [address]  DIAGNOSIS: Myeloproliferative disorder with positive JAK-2 mutation.  PRIOR THERAPY: None.  CURRENT THERAPY: Hydrea 500 mg by mouth daily. Status post approximately 38 months months of treatment.  The dose of Hydrea was increased to 500 mg twice a day on 04/03/2018.  Status post 29 months of this increased dose.  INTERVAL HISTORY: [name] 87 y.o. male returns to the clinic today for 64-month follow-up visit.  The patient is feeling fine today with no concerning complaints except for increased urinary frequency.  He has not seen his primary care's physician for a while and he needs to establish care with a new physician after the previous one left the practice.  He denied having any current chest pain, shortness of breath, cough or hemoptysis.  He denied having any fever or chills.  He has no nausea, vomiting, diarrhea or constipation.  He has no headache or visual changes.  He continues to tolerate his treatment with hydroxyurea fairly well.  The patient is here today for evaluation and repeat blood work.  ALLERGIES:  is allergic to nsaids.  MEDICATIONS:  Current Outpatient Medications  Medication Sig Dispense Refill   acetaminophen (TYLENOL) 325 MG tablet Take 650 mg by mouth every 6 (six) hours as needed for moderate pain.      allopurinol (ZYLOPRIM) 100 MG tablet TAKE 1 TABLET BY MOUTH EVERY DAY 90 tablet 1   amLODipine (NORVASC) 5 MG tablet Take 5 mg by mouth 2 (two) times daily.      amoxicillin (AMOXIL) 500 MG tablet Take 4 tablets by mouth as directed. 1 hour prior to dental appts.     atenolol (TENORMIN) 25 MG tablet Take 25 mg by mouth daily.  5   candesartan (ATACAND) 8 MG tablet Take 8 mg by mouth daily.     esomeprazole (NEXIUM) 20 MG capsule      fexofenadine (ALLEGRA) 180 MG tablet Take 180 mg by mouth  daily.     fluticasone (FLONASE) 50 MCG/ACT nasal spray Place 1 spray into both nostrils daily as needed for allergies.      hydroxyurea (HYDREA) 500 MG capsule TAKE 1 CAPSULE BY MOUTH TWICE A DAY MAY TAKE WITH FOOD TO MINIMIZE SIDE EFFECTS 180 capsule 0   indomethacin (INDOCIN) 50 MG capsule Take 1 capsule (50 mg total) by mouth 3 (three) times daily as needed. 21 capsule 0   ketotifen (ZADITOR) 0.025 % ophthalmic solution 1 drop 2 (two) times daily.     triamterene-hydrochlorothiazide (MAXZIDE-25) 37.5-25 MG per tablet Take 0.5 tablets by mouth daily.      TURMERIC PO Take 1 tablet by mouth daily.     No current facility-administered medications for this visit.    REVIEW OF SYSTEMS:  A comprehensive review of systems was negative except for: Constitutional: positive for fatigue Genitourinary: positive for frequency   PHYSICAL EXAMINATION: General appearance: alert, cooperative, and no distress Head: Normocephalic, without obvious abnormality, atraumatic Neck: no adenopathy Lymph nodes: Cervical, supraclavicular, and axillary nodes normal. Resp: clear to auscultation bilaterally Back: symmetric, no curvature. ROM normal. No CVA tenderness. Cardio: regular rate and rhythm, S1, S2 normal, no murmur, click, rub or gallop GI: soft, non-tender; bowel sounds normal; no masses,  no organomegaly Extremities: extremities normal, atraumatic, no cyanosis or edema  ECOG PERFORMANCE STATUS: 1 - Symptomatic but completely ambulatory  Blood  pressure (!) 116/58, pulse (!) 53, temperature 97.6 F (36.4 C), temperature source Tympanic, resp. rate 19, height 5\' 8"  (1.727 m), weight 203 lb 6.4 oz (92.3 kg), SpO2 100 %.  LABORATORY DATA: Lab Results  Component Value Date   WBC 8.0 05/04/2021   HGB 11.8 (L) 05/04/2021   HCT 33.3 (L) 05/04/2021   MCV 128.1 (H) 05/04/2021   PLT 240 05/04/2021      Chemistry      Component Value Date/Time   NA 136 01/28/2021 0951   NA 138 10/05/2017 1018   K 4.0  01/28/2021 0951   K 4.4 10/05/2017 1018   CL 109 01/28/2021 0951   CO2 21 (L) 01/28/2021 0951   CO2 25 10/05/2017 1018   BUN 28 (H) 01/28/2021 0951   BUN 21.8 10/05/2017 1018   CREATININE 1.42 (H) 01/28/2021 0951   CREATININE 1.3 10/05/2017 1018   GLU 88 03/17/2016 0000      Component Value Date/Time   CALCIUM 9.1 01/28/2021 0951   CALCIUM 9.9 10/05/2017 1018   ALKPHOS 68 01/28/2021 0951   ALKPHOS 154 (H) 10/05/2017 1018   AST 21 01/28/2021 0951   AST 18 10/05/2017 1018   ALT 13 01/28/2021 0951   ALT 13 10/05/2017 1018   BILITOT 0.4 01/28/2021 0951   BILITOT 0.45 10/05/2017 1018       RADIOGRAPHIC STUDIES: No results found.  ASSESSMENT AND PLAN:  This is a very pleasant 87 years old African-American male with myeloproliferative disorder and persistent leukocytosis and thrombocytosis with positive JAK 2 mutation. The patient is currently on treatment with hydroxyurea 500 mg p.o. daily status post 38 months. His treatment was changed to 500 mg p.o. twice daily in May 2019, status post 35 months of treatment. He continues to tolerate his treatment with hydroxyurea fairly well with no significant adverse effects. Repeat CBC today showed mild anemia but normal white blood count as well as platelets count. I recommended for the patient to continue his current treatment with hydroxyurea 500 mg p.o. twice daily. For the urine frequency, I advised the patient to establish with another primary care physician in the same practice for routine evaluation and management of his condition. He will come back for follow-up visit in 3 months for evaluation and repeat blood work. The patient was advised to call immediately if he has any concerning symptoms in the interval. The patient voices understanding of current disease status and treatment options and is in agreement with the current care plan. All questions were answered. The patient knows to call the clinic with any problems, questions or  concerns. We can certainly see the patient much sooner if necessary.  Disclaimer: This note was dictated with voice recognition software. Similar sounding words can inadvertently be transcribed and may not be corrected upon review.

## 2021-05-23 ENCOUNTER — Other Ambulatory Visit: Payer: Self-pay

## 2021-05-23 ENCOUNTER — Ambulatory Visit
Admission: EM | Admit: 2021-05-23 | Discharge: 2021-05-23 | Disposition: A | Discharge status: Home / Self Care | Payer: Medicare Other | Attending: Family Medicine | Admitting: Family Medicine

## 2021-05-23 ENCOUNTER — Ambulatory Visit (INDEPENDENT_AMBULATORY_CARE_PROVIDER_SITE_OTHER): Payer: Medicare Other

## 2021-05-23 ENCOUNTER — Encounter: Payer: Self-pay | Admitting: Emergency Medicine

## 2021-05-23 DIAGNOSIS — U071 COVID-19: Secondary | ICD-10-CM

## 2021-05-23 DIAGNOSIS — R059 Cough, unspecified: Secondary | ICD-10-CM | POA: Diagnosis not present

## 2021-05-23 DIAGNOSIS — R062 Wheezing: Secondary | ICD-10-CM

## 2021-05-23 MED ORDER — PROMETHAZINE-DM 6.25-15 MG/5ML PO SYRP: 2.5000 mL | ORAL_SOLUTION | Freq: Four times a day (QID) | ORAL | 0 refills | Status: DC | PRN

## 2021-05-23 NOTE — ED Notes (Signed)
Called to come in, has not arrived in lobby

## 2021-05-23 NOTE — ED Provider Notes (Signed)
[facility]    CSN: [id number] Arrival date & time: 05/23/21  1109      History   Chief Complaint Chief Complaint  Patient presents with   Cough    HPI 87 year old male presents with cough.  Patient reports that he developed symptoms last Thursday.  Was diagnosed with COVID-19 via a home test.  Was treated with molnupiravir.  This was done via telemedicine visit.  He was also given [employer] [facility].  He presents today for evaluation given persistent cough.  Wife is concerned as she feels like he is congested and wheezing.  No current fever.  Denies shortness of breath.  He is very active for his age.  He has had some initial improvement but the cough has worsened and is troublesome.  No other complaints at this time.  Past Medical History:  Diagnosis Date   Acute blood loss anemia    AF (atrial fibrillation) (HCC)    Allergic rhinitis    Anemia    Arthritis    BPH (benign prostatic hyperplasia)    Constipation    Dysrhythmia    PT STATES HAS HX A FIB - FOLLOWED BY DR.KEVIN LITTLE   GERD (gastroesophageal reflux disease)    OCCASIONAL - HAS NEXIUM IF NEEDED - NOT CURRENTLY TAKING   Hypertension    Myeloproliferative disorder ([place])    [facility] DR. Julien [facility]   Nocturia    Primary osteoarthritis of left hip    Psoriasis    Unsteady gait     Patient Active Problem List   Diagnosis Date Noted   Osteoarthritis of left hip 03/11/2016   Status post total replacement of right hip 03/11/2016   Myeloproliferative disorder ([place]) 08/27/2014   Leukocytosis 06/03/2013    Past Surgical History:  Procedure Laterality Date   HERNIA REPAIR  1998   TOTAL HIP ARTHROPLASTY Left 03/11/2016   Procedure: LEFT TOTAL HIP ARTHROPLASTY ANTERIOR APPROACH;  Surgeon: Mcarthur Rossetti, MD;  Location: [facility];  Service: Orthopedics;  Laterality: Left;  Went from [facility] to an [place] [facility]ations    Prior to Admission medications   Medication Sig  Start Date End Date Taking? Authorizing Provider  acetaminophen (TYLENOL) 325 MG tablet Take 650 mg by mouth every 6 (six) hours as needed for moderate pain.    Yes [provider]  amLODipine (NORVASC) 5 MG tablet Take 5 mg by mouth 2 (two) times daily.    Yes [provider]  atenolol (TENORMIN) 25 MG tablet Take 25 mg by mouth daily. 07/18/15  Yes [provider]  candesartan (ATACAND) 8 MG tablet Take 8 mg by mouth daily. 06/26/19  Yes [provider]  fexofenadine (ALLEGRA) 180 MG tablet Take 180 mg by mouth daily.   Yes [provider]  hydroxyurea (HYDREA) 500 MG capsule TAKE 1 CAPSULE BY MOUTH TWICE A DAY MAY TAKE WITH FOOD TO MINIMIZE SIDE EFFECTS 04/26/21  Yes Heilingoetter, Cassandra L, PA-C  promethazine-dextromethorphan (PROMETHAZINE-DM) 6.25-15 MG/5ML syrup Take 2.5-5 mLs by mouth 4 (four) times daily as needed for cough. 05/23/21  Yes Riham Polyakov G, DO  triamterene-hydrochlorothiazide (MAXZIDE-25) 37.5-25 MG per tablet Take 0.5 tablets by mouth daily.    Yes [provider]  allopurinol (ZYLOPRIM) 100 MG tablet TAKE 1 TABLET BY MOUTH EVERY DAY 02/10/21   Curt Bears, MD  benzonatate (TESSALON) 200 MG capsule Take 200 mg by mouth 3 (three) times daily. 05/13/21   [provider]  esomeprazole (NEXIUM) 20 MG capsule  02/26/16   [provider]  fluticasone (FLONASE) 50 MCG/ACT nasal spray Place 1 spray into both nostrils daily as needed for allergies.  05/30/14   [provider]  indomethacin (INDOCIN) 50 MG capsule Take 1 capsule (50 mg total) by mouth 3 (three) times daily as needed. 04/03/18   Curt Bears, MD  ketotifen (ZADITOR) 0.025 % ophthalmic solution 1 drop 2 (two) times daily.    [provider]  LAGEVRIO 200 MG CAPS SMARTSIG:4 Capsule(s) By Mouth Every 12 Hours 05/13/21   [provider]  TURMERIC PO Take 1 tablet by mouth daily.    [provider]   Social  History Social History   Tobacco Use   Smoking status: Never   Smokeless tobacco: Never  Vaping Use   Vaping Use: Never used  Substance Use Topics   Alcohol use: No   Drug use: No     Allergies   Nsaids   Review of Systems Review of Systems Per HPI  Physical Exam Triage Vital Signs ED Triage Vitals  Enc Vitals Group     BP 05/23/21 1331 (!) 148/67     Pulse Rate 05/23/21 1331 61     Resp 05/23/21 1331 20     Temp 05/23/21 1331 98.6 F (37 C)     Temp Source 05/23/21 1331 Oral     SpO2 05/23/21 1331 93 %     Weight --      Height --      Head Circumference --      Peak Flow --      Pain Score 05/23/21 1325 0     Pain Loc --      Pain Edu? --      Excl. in [place]? --    Updated Vital Signs BP (!) 148/67 (BP Location: Right Arm)   Pulse 61   Temp 98.6 F (37 C) (Oral)   Resp 20   SpO2 93%   Visual Acuity Right Eye Distance:   Left Eye Distance:   Bilateral Distance:    Right Eye Near:   Left Eye Near:    Bilateral Near:     Physical Exam Vitals and nursing note reviewed.  Constitutional:      General: He is not in acute distress.    Appearance: Normal appearance. He is not ill-appearing.  HENT:     Head: Normocephalic and atraumatic.  Eyes:     General:        Right eye: No discharge.        Left eye: No discharge.     Conjunctiva/sclera: Conjunctivae normal.  Cardiovascular:     Rate and Rhythm: Normal rate and regular rhythm.  Pulmonary:     Effort: Pulmonary effort is normal. No respiratory distress.     Breath sounds: No wheezing or rales.  Neurological:     Mental Status: He is alert.  Psychiatric:        Mood and Affect: Mood normal.        Behavior: Behavior normal.     UC Treatments / Results  Labs (all labs ordered are listed, but only abnormal results are displayed) Labs Reviewed - No data to display  EKG   Radiology DG Chest 2 View  Result Date: 05/23/2021 CLINICAL DATA:  Cough and wheezing. EXAM: CHEST - 2 VIEW  COMPARISON:  05/17/2011 FINDINGS: The cardiomediastinal silhouette is unremarkable. Mild peribronchial thickening and minimal bibasilar atelectasis noted.  There is no evidence of focal airspace disease, pulmonary edema, suspicious pulmonary nodule/mass, pleural effusion, or pneumothorax. No acute bony abnormalities are identified. IMPRESSION: Mild peribronchial thickening and minimal bibasilar atelectasis. No evidence of focal pneumonia. Electronically Signed   By: Margarette Canada M.D.   On: 05/23/2021 14:03    Procedures Procedures (including critical care time)  Medications Ordered in UC Medications - No data to display  Initial Impression / Assessment and Plan / UC Course  I have reviewed the triage vital signs and the nursing notes.  Pertinent labs & imaging results that were available during my care of the patient were reviewed by me and considered in my medical decision making (see chart for details).    87 year old male presents with cough in the setting of recent COVID-19.  Chest x-ray was obtained and was independently reviewed by me.  Interpretation: Bibasilar atelectasis.  No apparent focal pneumonia.  Promethazine DM for cough to be used sparingly given his age.  We discussed this in detail today.  Supportive care.  Final Clinical Impressions(s) / UC Diagnoses   Final diagnoses:  COVID  Cough     Discharge Instructions      Use the medicine only if absolutely necessary.  It make you drowsy.  Take care  Dr. Lacinda Axon    ED Prescriptions     Medication Sig Dispense Auth. Provider   promethazine-dextromethorphan (PROMETHAZINE-DM) 6.25-15 MG/5ML syrup Take 2.5-5 mLs by mouth 4 (four) times daily as needed for cough. 118 mL Coral Spikes, DO      PDMP not reviewed this encounter.   Coral Spikes, [place] 05/23/21 1427

## 2021-05-23 NOTE — Discharge Instructions (Addendum)
Use the medicine only if absolutely necessary.  It make you drowsy.  Take care  Dr. Lacinda Axon

## 2021-05-23 NOTE — ED Triage Notes (Signed)
Patient has a cough since Thursday evening.  Cough and congestion and slight wheezing.  Home test was positive covid.   Patient had a televisit and was prescribed medicine.  Was feeling better, but cough has worsened

## 2021-06-26 ENCOUNTER — Other Ambulatory Visit: Payer: Self-pay | Admitting: Physician Assistant

## 2021-06-26 DIAGNOSIS — D471 Chronic myeloproliferative disease: Secondary | ICD-10-CM

## 2021-08-04 ENCOUNTER — Inpatient Hospital Stay: Payer: Medicare Other | Attending: Internal Medicine | Admitting: Internal Medicine

## 2021-08-04 ENCOUNTER — Other Ambulatory Visit: Payer: Self-pay

## 2021-08-04 ENCOUNTER — Inpatient Hospital Stay: Payer: Medicare Other

## 2021-08-04 VITALS — BP 112/54 | HR 50 | Temp 96.5°F | Resp 20 | Ht 68.0 in | Wt 198.0 lb

## 2021-08-04 DIAGNOSIS — D72829 Elevated white blood cell count, unspecified: Secondary | ICD-10-CM | POA: Diagnosis not present

## 2021-08-04 DIAGNOSIS — D471 Chronic myeloproliferative disease: Secondary | ICD-10-CM | POA: Diagnosis not present

## 2021-08-04 DIAGNOSIS — R350 Frequency of micturition: Secondary | ICD-10-CM | POA: Insufficient documentation

## 2021-08-04 DIAGNOSIS — Z79899 Other long term (current) drug therapy: Secondary | ICD-10-CM | POA: Insufficient documentation

## 2021-08-04 DIAGNOSIS — D75839 Thrombocytosis, unspecified: Secondary | ICD-10-CM | POA: Insufficient documentation

## 2021-08-04 DIAGNOSIS — Z1589 Genetic susceptibility to other disease: Secondary | ICD-10-CM | POA: Diagnosis not present

## 2021-08-04 LAB — CBC WITH DIFFERENTIAL (CANCER CENTER ONLY)
Abs Immature Granulocytes: 0 10*3/uL (ref 0.00–0.07)
Band Neutrophils: 1 %
Basophils Absolute: 0.1 10*3/uL (ref 0.0–0.1)
Basophils Relative: 1 %
Eosinophils Absolute: 0 10*3/uL (ref 0.0–0.5)
Eosinophils Relative: 0 %
HCT: 35 % — ABNORMAL LOW (ref 39.0–52.0)
Hemoglobin: 12.3 g/dL — ABNORMAL LOW (ref 13.0–17.0)
Lymphocytes Relative: 32 %
Lymphs Abs: 2.3 10*3/uL (ref 0.7–4.0)
MCH: 44.9 pg — ABNORMAL HIGH (ref 26.0–34.0)
MCHC: 35.1 g/dL (ref 30.0–36.0)
MCV: 127.7 fL — ABNORMAL HIGH (ref 80.0–100.0)
Monocytes Absolute: 0.7 10*3/uL (ref 0.1–1.0)
Monocytes Relative: 9 %
Neutro Abs: 4.2 10*3/uL (ref 1.7–7.7)
Neutrophils Relative %: 57 %
Platelet Count: 248 10*3/uL (ref 150–400)
RBC: 2.74 MIL/uL — ABNORMAL LOW (ref 4.22–5.81)
RDW: 12.3 % (ref 11.5–15.5)
WBC Count: 7.3 10*3/uL (ref 4.0–10.5)
nRBC: 0 % (ref 0.0–0.2)

## 2021-08-04 LAB — CMP (CANCER CENTER ONLY)
ALT: 8 U/L (ref 0–44)
AST: 14 U/L — ABNORMAL LOW (ref 15–41)
Albumin: 3.6 g/dL (ref 3.5–5.0)
Alkaline Phosphatase: 58 U/L (ref 38–126)
Anion gap: 9 (ref 5–15)
BUN: 31 mg/dL — ABNORMAL HIGH (ref 8–23)
CO2: 23 mmol/L (ref 22–32)
Calcium: 9.8 mg/dL (ref 8.9–10.3)
Chloride: 108 mmol/L (ref 98–111)
Creatinine: 1.61 mg/dL — ABNORMAL HIGH (ref 0.61–1.24)
GFR, Estimated: 39 mL/min — ABNORMAL LOW (ref >60)
Glucose, Bld: 97 mg/dL (ref 70–99)
Potassium: 3.9 mmol/L (ref 3.5–5.1)
Sodium: 140 mmol/L (ref 135–145)
Total Bilirubin: 0.5 mg/dL (ref 0.3–1.2)
Total Protein: 7.7 g/dL (ref 6.5–8.1)

## 2021-08-04 LAB — LACTATE DEHYDROGENASE: LDH: 169 U/L (ref 98–192)

## 2021-08-04 NOTE — Addendum Note (Signed)
Addended by: Ardeen Garland on: 08/04/2021 10:31 AM   Modules accepted: Orders

## 2021-08-04 NOTE — Progress Notes (Signed)
[place] [phone]   Fax:([phone]) [place], MD [place] [address]  DIAGNOSIS: Myeloproliferative disorder with positive JAK-2 mutation.  PRIOR THERAPY: None.  CURRENT THERAPY: Hydrea 500 mg by mouth daily. Status post approximately 38 months months of treatment.  The dose of Hydrea was increased to 500 mg twice a day on 04/03/2018.  Status post 38 months of this increased dose.  INTERVAL HISTORY: Adam Tapia 87 y.o. male returns to the clinic today for follow-up visit.  The patient is feeling fine today with no concerning complaints except for increased urinary frequency recently.  He denied having any current chest pain, shortness of breath, cough or hemoptysis.  He denied having any fever or chills.  He has no nausea, vomiting, diarrhea or constipation.  He has no headache or visual changes.  The patient continues to tolerate hydroxyurea fairly well.  He is here today for evaluation and repeat blood work.  ALLERGIES:  is allergic to nsaids.  MEDICATIONS:  Current Outpatient Medications  Medication Sig Dispense Refill   acetaminophen (TYLENOL) 325 MG tablet Take 650 mg by mouth every 6 (six) hours as needed for moderate pain.      allopurinol (ZYLOPRIM) 100 MG tablet TAKE 1 TABLET BY MOUTH EVERY DAY 90 tablet 1   amLODipine (NORVASC) 5 MG tablet Take 5 mg by mouth 2 (two) times daily.      atenolol (TENORMIN) 25 MG tablet Take 25 mg by mouth daily.  5   benzonatate (TESSALON) 200 MG capsule Take 200 mg by mouth 3 (three) times daily.     candesartan (ATACAND) 8 MG tablet Take 8 mg by mouth daily.     esomeprazole (NEXIUM) 20 MG capsule      fexofenadine (ALLEGRA) 180 MG tablet Take 180 mg by mouth daily.     fluticasone (FLONASE) 50 MCG/ACT nasal spray Place 1 spray into both nostrils daily as needed for allergies.      hydroxyurea (HYDREA) 500 MG capsule TAKE 1 CAPSULE BY MOUTH TWICE A  DAY MAY TAKE WITH FOOD TO MINIMIZE SIDE EFFECTS 180 capsule 0   indomethacin (INDOCIN) 50 MG capsule Take 1 capsule (50 mg total) by mouth 3 (three) times daily as needed. 21 capsule 0   ketotifen (ZADITOR) 0.025 % ophthalmic solution 1 drop 2 (two) times daily.     LAGEVRIO 200 MG CAPS SMARTSIG:4 Capsule(s) By Mouth Every 12 Hours     promethazine-dextromethorphan (PROMETHAZINE-DM) 6.25-15 MG/5ML syrup Take 2.5-5 mLs by mouth 4 (four) times daily as needed for cough. 118 mL 0   triamterene-hydrochlorothiazide (MAXZIDE-25) 37.5-25 MG per tablet Take 0.5 tablets by mouth daily.      TURMERIC PO Take 1 tablet by mouth daily.     No current facility-administered medications for this visit.    REVIEW OF SYSTEMS:  A comprehensive review of systems was negative except for: Constitutional: positive for fatigue Genitourinary: positive for frequency   PHYSICAL EXAMINATION: General appearance: alert, cooperative, and no distress Head: Normocephalic, without obvious abnormality, atraumatic Neck: no adenopathy Lymph nodes: Cervical, supraclavicular, and axillary nodes normal. Resp: clear to auscultation bilaterally Back: symmetric, no curvature. ROM normal. No CVA tenderness. Cardio: regular rate and rhythm, S1, S2 normal, no murmur, click, rub or gallop GI: soft, non-tender; bowel sounds normal; no masses,  no organomegaly Extremities: extremities normal, atraumatic, no cyanosis or edema  ECOG PERFORMANCE STATUS: 1 - Symptomatic but completely ambulatory  Blood pressure (!) 112/54, pulse (!) 50, temperature (!) 96.5 F (35.8 C), temperature source Tympanic, resp. rate 20, height '5\' 8"'$  (1.727 m), weight 198 lb (89.8 kg), SpO2 96 %.  LABORATORY DATA: Lab Results  Component Value Date   WBC 7.3 08/04/2021   HGB 12.3 (L) 08/04/2021   HCT 35.0 (L) 08/04/2021   MCV 127.7 (H) 08/04/2021   PLT 248 08/04/2021      Chemistry      Component Value Date/Time   NA 140 08/04/2021 0911   NA 138  10/05/2017 1018   K 3.9 08/04/2021 0911   K 4.4 10/05/2017 1018   CL 108 08/04/2021 0911   CO2 23 08/04/2021 0911   CO2 25 10/05/2017 1018   BUN 31 (H) 08/04/2021 0911   BUN 21.8 10/05/2017 1018   CREATININE 1.61 (H) 08/04/2021 0911   CREATININE 1.3 10/05/2017 1018   GLU 88 03/17/2016 0000      Component Value Date/Time   CALCIUM 9.8 08/04/2021 0911   CALCIUM 9.9 10/05/2017 1018   ALKPHOS 58 08/04/2021 0911   ALKPHOS 154 (H) 10/05/2017 1018   AST 14 (L) 08/04/2021 0911   AST 18 10/05/2017 1018   ALT 8 08/04/2021 0911   ALT 13 10/05/2017 1018   BILITOT 0.5 08/04/2021 0911   BILITOT 0.45 10/05/2017 1018       RADIOGRAPHIC STUDIES: No results found.  ASSESSMENT AND PLAN:  This is a very pleasant 87 years old African-American male with myeloproliferative disorder and persistent leukocytosis and thrombocytosis with positive JAK 2 mutation. The patient is currently on treatment with hydroxyurea 500 mg p.o. daily status post 38 months. His treatment was changed to 500 mg p.o. twice daily in May 2019, status post 38 months of treatment. He continues to tolerate his treatment with hydroxyurea fairly well. The patient has repeat CBC and comprehensive metabolic panel performed today that are unremarkable for any significant change. I recommended for him to continue his current treatment with the same dose. I will see him back for follow-up visit in 3 months for evaluation and repeat blood work. The patient was advised to call immediately if he has any other concerning symptoms in the interval. The patient voices understanding of current disease status and treatment options and is in agreement with the current care plan. All questions were answered. The patient knows to call the clinic with any problems, questions or concerns. We can certainly see the patient much sooner if necessary.  Disclaimer: This note was dictated with voice recognition software. Similar sounding words can  inadvertently be transcribed and may not be corrected upon review.

## 2021-08-20 ENCOUNTER — Other Ambulatory Visit: Payer: Self-pay | Admitting: Medical Oncology

## 2021-08-20 DIAGNOSIS — D471 Chronic myeloproliferative disease: Secondary | ICD-10-CM

## 2021-08-20 MED ORDER — HYDROXYUREA 500 MG PO CAPS: ORAL_CAPSULE | ORAL | 0 refills | Status: DC

## 2021-11-03 ENCOUNTER — Other Ambulatory Visit: Payer: Self-pay

## 2021-11-03 ENCOUNTER — Inpatient Hospital Stay (HOSPITAL_BASED_OUTPATIENT_CLINIC_OR_DEPARTMENT_OTHER): Payer: Medicare Other | Admitting: Internal Medicine

## 2021-11-03 ENCOUNTER — Inpatient Hospital Stay: Payer: Medicare Other | Attending: Internal Medicine

## 2021-11-03 VITALS — BP 117/66 | HR 58 | Temp 96.5°F | Resp 20 | Ht 68.0 in | Wt 198.3 lb

## 2021-11-03 DIAGNOSIS — D75839 Thrombocytosis, unspecified: Secondary | ICD-10-CM | POA: Diagnosis not present

## 2021-11-03 DIAGNOSIS — D72829 Elevated white blood cell count, unspecified: Secondary | ICD-10-CM | POA: Diagnosis not present

## 2021-11-03 DIAGNOSIS — D471 Chronic myeloproliferative disease: Secondary | ICD-10-CM | POA: Diagnosis not present

## 2021-11-03 DIAGNOSIS — D649 Anemia, unspecified: Secondary | ICD-10-CM | POA: Diagnosis not present

## 2021-11-03 DIAGNOSIS — Z79899 Other long term (current) drug therapy: Secondary | ICD-10-CM | POA: Diagnosis not present

## 2021-11-03 LAB — CMP (CANCER CENTER ONLY)
ALT: 8 U/L (ref 0–44)
AST: 18 U/L (ref 15–41)
Albumin: 3.7 g/dL (ref 3.5–5.0)
Alkaline Phosphatase: 58 U/L (ref 38–126)
Anion gap: 6 (ref 5–15)
BUN: 39 mg/dL — ABNORMAL HIGH (ref 8–23)
CO2: 23 mmol/L (ref 22–32)
Calcium: 9.2 mg/dL (ref 8.9–10.3)
Chloride: 107 mmol/L (ref 98–111)
Creatinine: 1.64 mg/dL — ABNORMAL HIGH (ref 0.61–1.24)
GFR, Estimated: 38 mL/min — ABNORMAL LOW (ref >60)
Glucose, Bld: 102 mg/dL — ABNORMAL HIGH (ref 70–99)
Potassium: 3.9 mmol/L (ref 3.5–5.1)
Sodium: 136 mmol/L (ref 135–145)
Total Bilirubin: 0.7 mg/dL (ref 0.3–1.2)
Total Protein: 8.1 g/dL (ref 6.5–8.1)

## 2021-11-03 LAB — CBC WITH DIFFERENTIAL (CANCER CENTER ONLY)
Abs Immature Granulocytes: 0 10*3/uL (ref 0.00–0.07)
Basophils Absolute: 0 10*3/uL (ref 0.0–0.1)
Basophils Relative: 0 %
Eosinophils Absolute: 0.1 10*3/uL (ref 0.0–0.5)
Eosinophils Relative: 1 %
HCT: 35.3 % — ABNORMAL LOW (ref 39.0–52.0)
Hemoglobin: 12.4 g/dL — ABNORMAL LOW (ref 13.0–17.0)
Lymphocytes Relative: 43 %
Lymphs Abs: 2.8 10*3/uL (ref 0.7–4.0)
MCH: 44.3 pg — ABNORMAL HIGH (ref 26.0–34.0)
MCHC: 35.1 g/dL (ref 30.0–36.0)
MCV: 126.1 fL — ABNORMAL HIGH (ref 80.0–100.0)
Monocytes Absolute: 1 10*3/uL (ref 0.1–1.0)
Monocytes Relative: 15 %
Neutro Abs: 2.6 10*3/uL (ref 1.7–7.7)
Neutrophils Relative %: 41 %
Platelet Count: 261 10*3/uL (ref 150–400)
RBC: 2.8 MIL/uL — ABNORMAL LOW (ref 4.22–5.81)
RDW: 12.1 % (ref 11.5–15.5)
WBC Count: 6.4 10*3/uL (ref 4.0–10.5)
nRBC: 0 % (ref 0.0–0.2)

## 2021-11-03 LAB — LACTATE DEHYDROGENASE: LDH: 191 U/L (ref 98–192)

## 2021-11-03 NOTE — Progress Notes (Signed)
[place] [phone]   Fax:([phone]) [place], MD [place] [address]  DIAGNOSIS: Myeloproliferative disorder with positive JAK-2 mutation.  PRIOR THERAPY: None.  CURRENT THERAPY: Hydrea 500 mg by mouth daily. Status post approximately 38 months months of treatment.  The dose of Hydrea was increased to 500 mg twice a day on 04/03/2018.  Status post 41 months of this increased dose.  INTERVAL HISTORY: Adam Tapia 87 y.o. male returns to the clinic today for 30-month follow-up visit.  The patient is feeling fine today with no concerning complaints except for occasional diarrhea in the morning.  He denied having any chest pain, shortness of breath, cough or hemoptysis.  He denied having any nausea, vomiting, abdominal pain or constipation.  He has no bleeding, bruises or ecchymosis.  He has no weight loss or night sweats.  He continues to tolerate his treatment with hydroxyurea fairly well.  ALLERGIES:  is allergic to nsaids.  MEDICATIONS:  Current Outpatient Medications  Medication Sig Dispense Refill   acetaminophen (TYLENOL) 325 MG tablet Take 650 mg by mouth every 6 (six) hours as needed for moderate pain.      allopurinol (ZYLOPRIM) 100 MG tablet TAKE 1 TABLET BY MOUTH EVERY DAY 90 tablet 1   amLODipine (NORVASC) 5 MG tablet Take 5 mg by mouth 2 (two) times daily.      atenolol (TENORMIN) 25 MG tablet Take 25 mg by mouth daily.  5   candesartan (ATACAND) 8 MG tablet Take 8 mg by mouth daily.     esomeprazole (NEXIUM) 20 MG capsule      fexofenadine (ALLEGRA) 180 MG tablet Take 180 mg by mouth daily.     fluticasone (FLONASE) 50 MCG/ACT nasal spray Place 1 spray into both nostrils daily as needed for allergies.      hydroxyurea (HYDREA) 500 MG capsule TAKE 1 CAPSULE BY MOUTH TWICE A DAY MAY TAKE WITH FOOD TO MINIMIZE SIDE EFFECTS 180 capsule 0   indomethacin (INDOCIN) 50 MG capsule Take 1  capsule (50 mg total) by mouth 3 (three) times daily as needed. 21 capsule 0   ketotifen (ZADITOR) 0.025 % ophthalmic solution 1 drop 2 (two) times daily.     LAGEVRIO 200 MG CAPS SMARTSIG:4 Capsule(s) By Mouth Every 12 Hours     promethazine-dextromethorphan (PROMETHAZINE-DM) 6.25-15 MG/5ML syrup Take 2.5-5 mLs by mouth 4 (four) times daily as needed for cough. 118 mL 0   triamterene-hydrochlorothiazide (MAXZIDE-25) 37.5-25 MG per tablet Take 0.5 tablets by mouth daily.      TURMERIC PO Take 1 tablet by mouth daily.     No current facility-administered medications for this visit.    REVIEW OF SYSTEMS:  A comprehensive review of systems was negative except for: Constitutional: positive for fatigue Gastrointestinal: positive for diarrhea Genitourinary: positive for frequency   PHYSICAL EXAMINATION: General appearance: alert, cooperative, and no distress Head: Normocephalic, without obvious abnormality, atraumatic Neck: no adenopathy Lymph nodes: Cervical, supraclavicular, and axillary nodes normal. Resp: clear to auscultation bilaterally Back: symmetric, no curvature. ROM normal. No CVA tenderness. Cardio: regular rate and rhythm, S1, S2 normal, no murmur, click, rub or gallop GI: soft, non-tender; bowel sounds normal; no masses,  no organomegaly Extremities: extremities normal, atraumatic, no cyanosis or edema  ECOG PERFORMANCE STATUS: 1 - Symptomatic but completely ambulatory  Blood pressure 117/66, pulse (!) 58, temperature (!) 96.5 F (35.8 C), temperature source Tympanic, resp. rate 20,  height 5\' 8"  (1.727 m), weight 198 lb 4.8 oz (89.9 kg), SpO2 98 %.  LABORATORY DATA: Lab Results  Component Value Date   WBC 7.3 08/04/2021   HGB 12.3 (L) 08/04/2021   HCT 35.0 (L) 08/04/2021   MCV 127.7 (H) 08/04/2021   PLT 248 08/04/2021      Chemistry      Component Value Date/Time   NA 140 08/04/2021 0911   NA 138 10/05/2017 1018   K 3.9 08/04/2021 0911   K 4.4 10/05/2017 1018    CL 108 08/04/2021 0911   CO2 23 08/04/2021 0911   CO2 25 10/05/2017 1018   BUN 31 (H) 08/04/2021 0911   BUN 21.8 10/05/2017 1018   CREATININE 1.61 (H) 08/04/2021 0911   CREATININE 1.3 10/05/2017 1018   GLU 88 03/17/2016 0000      Component Value Date/Time   CALCIUM 9.8 08/04/2021 0911   CALCIUM 9.9 10/05/2017 1018   ALKPHOS 58 08/04/2021 0911   ALKPHOS 154 (H) 10/05/2017 1018   AST 14 (L) 08/04/2021 0911   AST 18 10/05/2017 1018   ALT 8 08/04/2021 0911   ALT 13 10/05/2017 1018   BILITOT 0.5 08/04/2021 0911   BILITOT 0.45 10/05/2017 1018       RADIOGRAPHIC STUDIES: No results found.  ASSESSMENT AND PLAN:  This is a very pleasant 87 years old African-American male with myeloproliferative disorder and persistent leukocytosis and thrombocytosis with positive JAK 2 mutation. The patient is currently on treatment with hydroxyurea 500 mg p.o. daily status post 38 months. His treatment was changed to 500 mg p.o. twice daily in May 2019, status post 41 months of treatment. The patient continues to tolerate his treatment with hydroxyurea fairly well except for 1 episode of semisolid stool in the morning. CBC today is unremarkable except for the mild anemia. I recommended for the patient to continue his current treatment with hydroxyurea 500 mg p.o. twice daily. I will see him back for follow-up visit in 3 months for evaluation and repeat blood work. He was advised to call immediately if he has any other concerning symptoms in the interval. The patient voices understanding of current disease status and treatment options and is in agreement with the current care plan. All questions were answered. The patient knows to call the clinic with any problems, questions or concerns. We can certainly see the patient much sooner if necessary.  Disclaimer: This note was dictated with voice recognition software. Similar sounding words can inadvertently be transcribed and may not be corrected upon  review.

## 2021-12-19 IMAGING — US US RENAL
1 series · 14 of 25 positions shown · non-contrast
Comparison: None.

CLINICAL DATA: Stage 3 chronic kidney disease

EXAM:
RENAL / URINARY TRACT ULTRASOUND COMPLETE

[Series 1: us renal · 0.25mm/px · 14 of 38 slices shown]
[im 1/38]
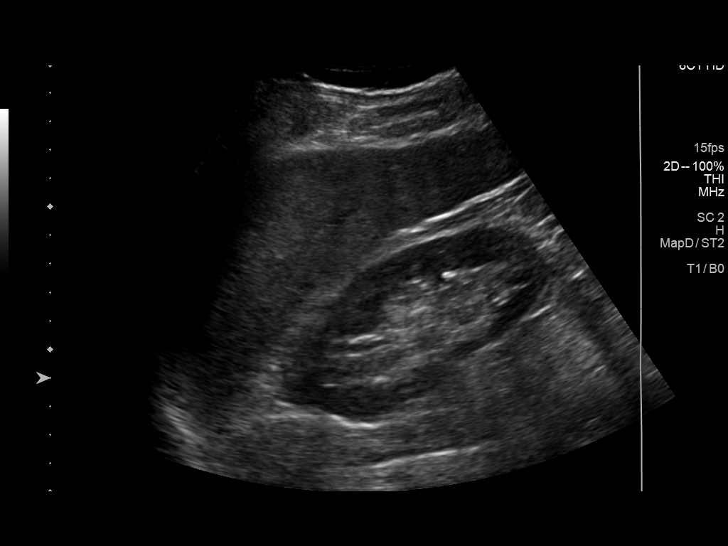
[im 4/38]
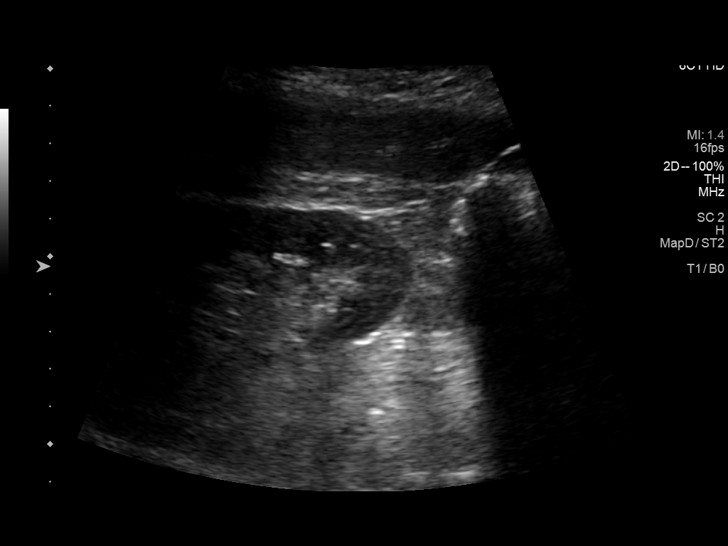
[im 7/38]
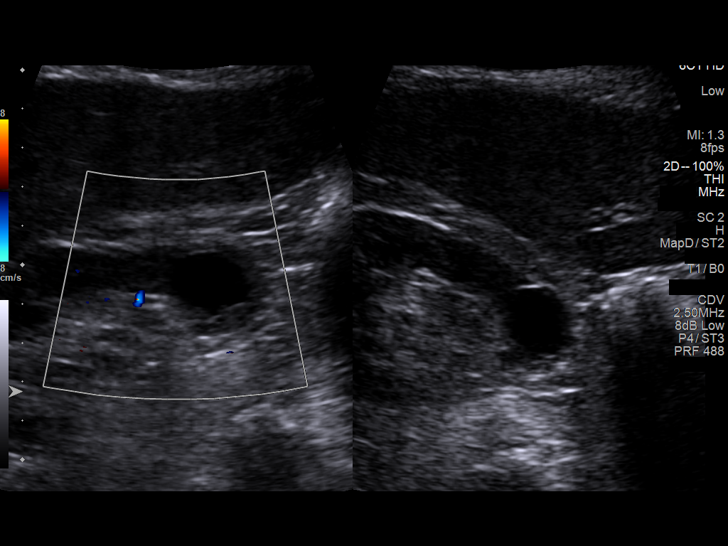
[im 10/38]
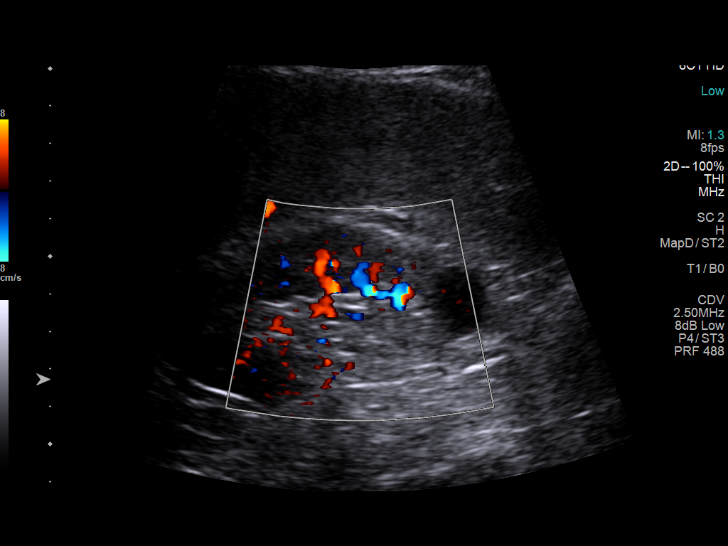
[im 13/38]
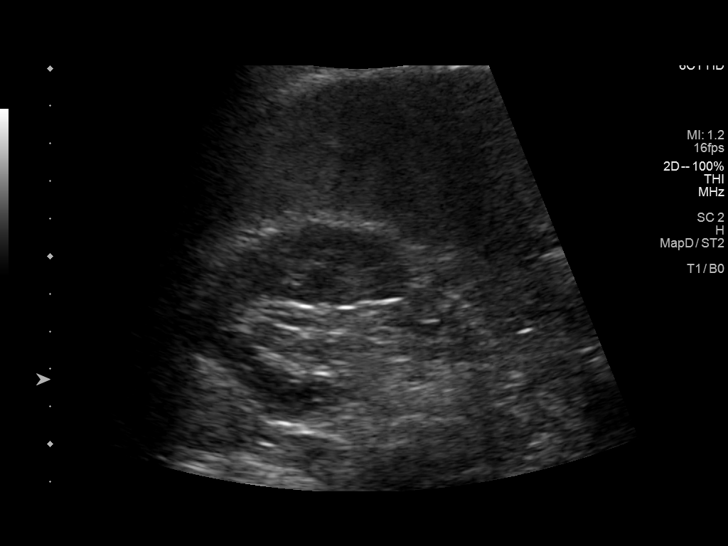
[im 14/38]
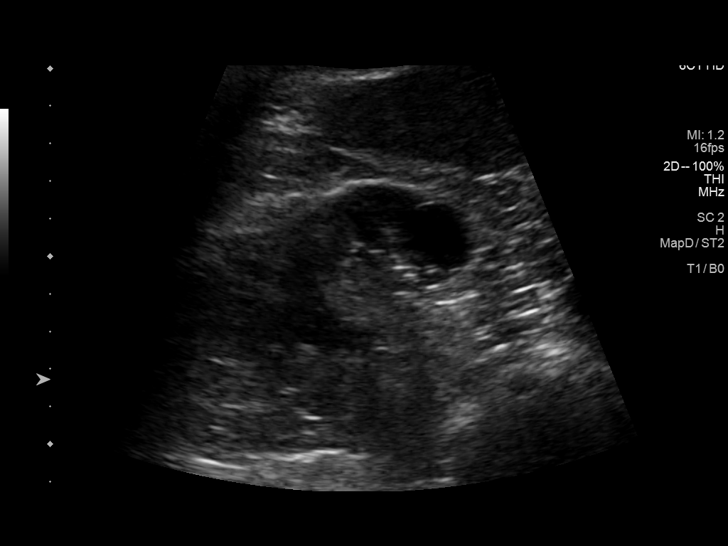
[im 17/38]
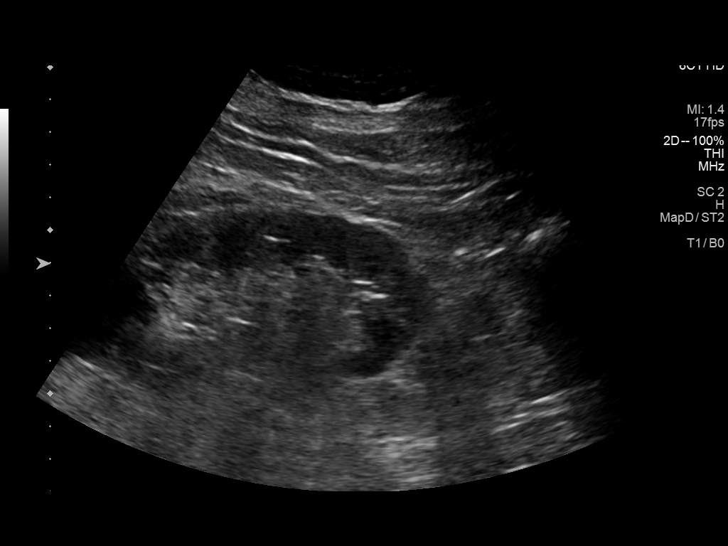
[im 21/38]
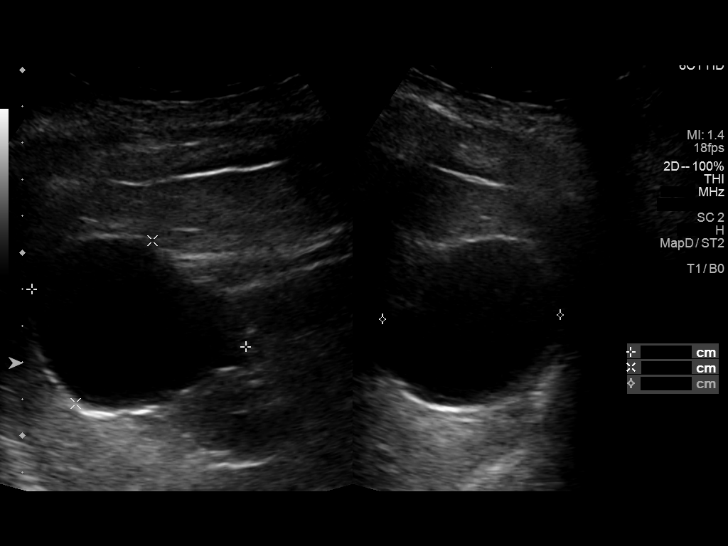
[im 24/38]
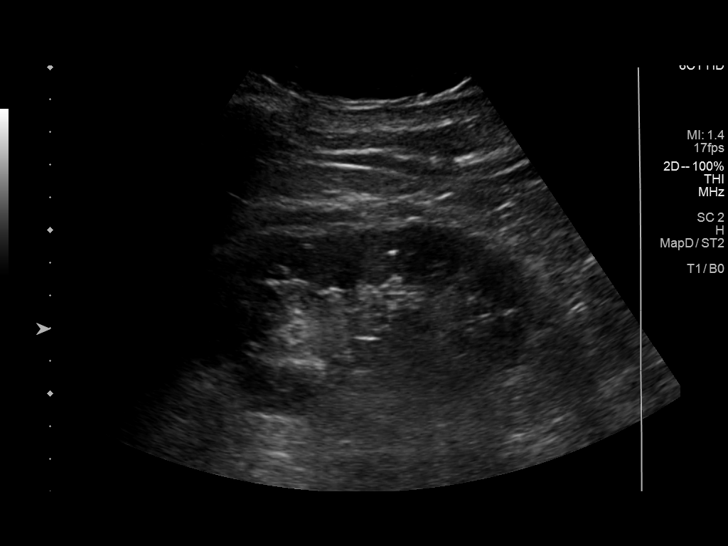
[im 25/38]
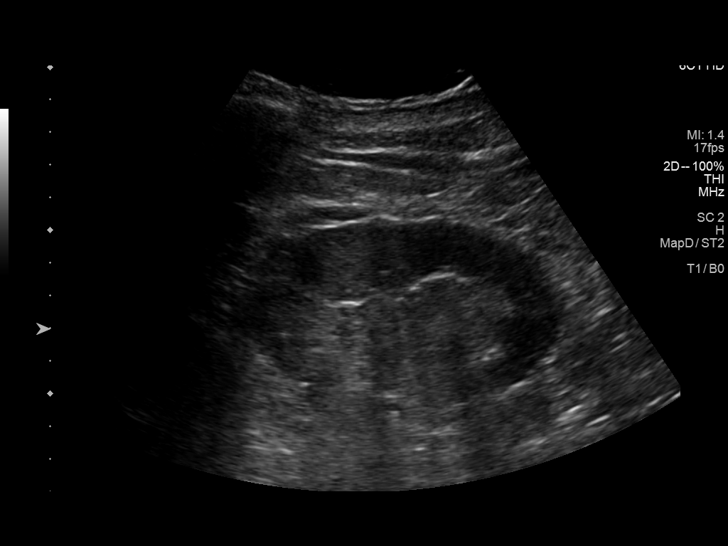
[im 28/38]
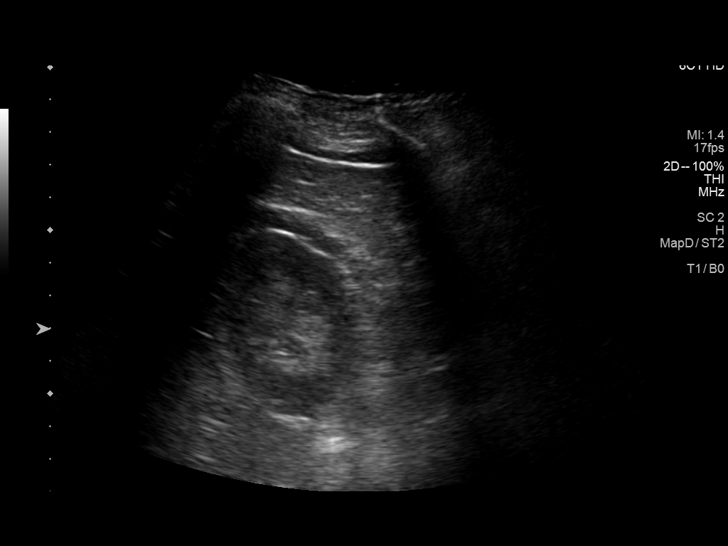
[im 31/38]
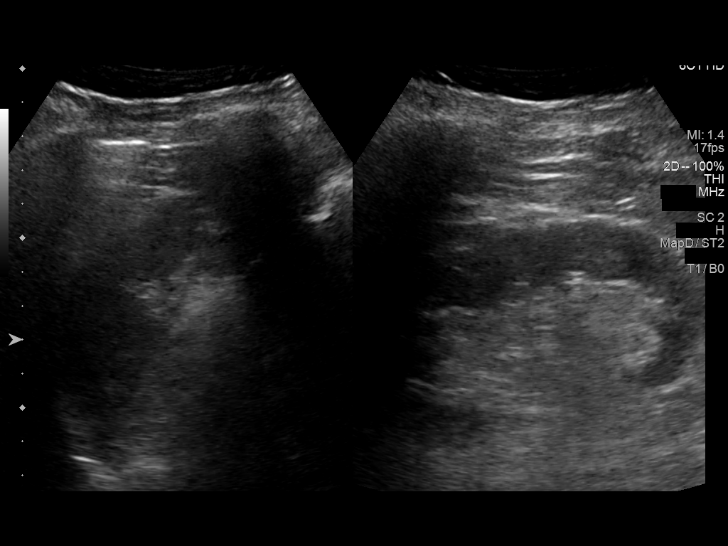
[im 34/38]
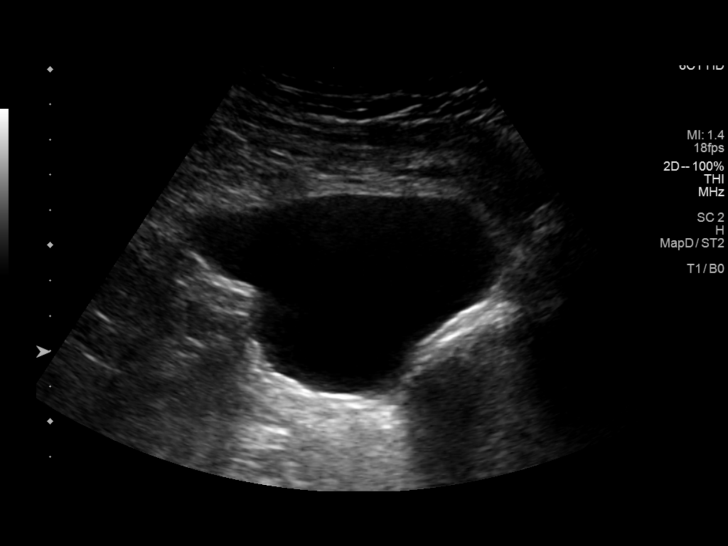
[im 38/38]
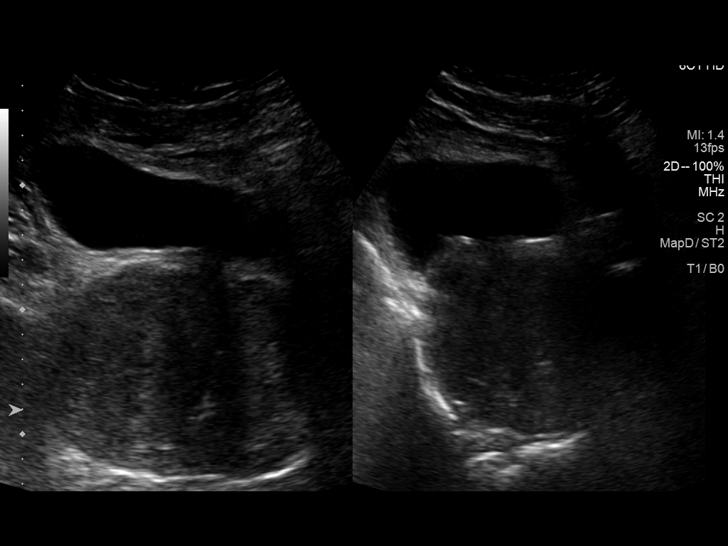

[14 of 25 positions shown; findings below may reference images not displayed]

FINDINGS: Right Kidney:

Renal measurements: 10.2 x 4.8 x 5.6 cm = volume: 142 mL. 1.7 cm
cyst in the lower pole. Normal echotexture. No hydronephrosis.

Left Kidney:

Renal measurements: 10.1 x 5.7 x 4.2 cm = volume: 129 mL. 6 cm
simple appearing upper pole cyst. Normal echotexture. No
hydronephrosis.

Bladder:

Appears normal for degree of bladder distention.

Other:

Prostate enlargement.
IMPRESSION: Bilateral simple appearing renal cysts, the largest 6 cm off the
upper pole of left kidney.

No acute findings.  No hydronephrosis.

Prostatomegaly

## 2022-01-06 ENCOUNTER — Other Ambulatory Visit: Payer: Self-pay | Admitting: Physician Assistant

## 2022-01-06 DIAGNOSIS — D471 Chronic myeloproliferative disease: Secondary | ICD-10-CM

## 2022-02-02 ENCOUNTER — Inpatient Hospital Stay (HOSPITAL_BASED_OUTPATIENT_CLINIC_OR_DEPARTMENT_OTHER): Payer: Medicare Other | Admitting: Internal Medicine

## 2022-02-02 ENCOUNTER — Inpatient Hospital Stay: Payer: Medicare Other | Attending: Internal Medicine

## 2022-02-02 ENCOUNTER — Other Ambulatory Visit: Payer: Self-pay

## 2022-02-02 VITALS — BP 148/72 | HR 65 | Temp 97.6°F | Resp 18 | Wt 199.1 lb

## 2022-02-02 DIAGNOSIS — D471 Chronic myeloproliferative disease: Secondary | ICD-10-CM | POA: Insufficient documentation

## 2022-02-02 DIAGNOSIS — R197 Diarrhea, unspecified: Secondary | ICD-10-CM | POA: Insufficient documentation

## 2022-02-02 DIAGNOSIS — D75839 Thrombocytosis, unspecified: Secondary | ICD-10-CM | POA: Insufficient documentation

## 2022-02-02 DIAGNOSIS — D72829 Elevated white blood cell count, unspecified: Secondary | ICD-10-CM | POA: Diagnosis not present

## 2022-02-02 DIAGNOSIS — Z79899 Other long term (current) drug therapy: Secondary | ICD-10-CM | POA: Insufficient documentation

## 2022-02-02 LAB — CBC WITH DIFFERENTIAL (CANCER CENTER ONLY)
Abs Immature Granulocytes: 0.33 10*3/uL — ABNORMAL HIGH (ref 0.00–0.07)
Basophils Absolute: 0.1 10*3/uL (ref 0.0–0.1)
Basophils Relative: 1 %
Eosinophils Absolute: 0 10*3/uL (ref 0.0–0.5)
Eosinophils Relative: 0 %
HCT: 37.9 % — ABNORMAL LOW (ref 39.0–52.0)
Hemoglobin: 13.5 g/dL (ref 13.0–17.0)
Immature Granulocytes: 3 %
Lymphocytes Relative: 23 %
Lymphs Abs: 2.2 10*3/uL (ref 0.7–4.0)
MCH: 44.4 pg — ABNORMAL HIGH (ref 26.0–34.0)
MCHC: 35.6 g/dL (ref 30.0–36.0)
MCV: 124.7 fL — ABNORMAL HIGH (ref 80.0–100.0)
Monocytes Absolute: 0.8 10*3/uL (ref 0.1–1.0)
Monocytes Relative: 8 %
Neutro Abs: 6.2 10*3/uL (ref 1.7–7.7)
Neutrophils Relative %: 65 %
Platelet Count: 269 10*3/uL (ref 150–400)
RBC: 3.04 MIL/uL — ABNORMAL LOW (ref 4.22–5.81)
RDW: 11.4 % — ABNORMAL LOW (ref 11.5–15.5)
WBC Count: 9.6 10*3/uL (ref 4.0–10.5)
nRBC: 0 % (ref 0.0–0.2)

## 2022-02-02 LAB — CMP (CANCER CENTER ONLY)
ALT: 9 U/L (ref 0–44)
AST: 17 U/L (ref 15–41)
Albumin: 3.9 g/dL (ref 3.5–5.0)
Alkaline Phosphatase: 64 U/L (ref 38–126)
Anion gap: 5 (ref 5–15)
BUN: 25 mg/dL — ABNORMAL HIGH (ref 8–23)
CO2: 25 mmol/L (ref 22–32)
Calcium: 9.8 mg/dL (ref 8.9–10.3)
Chloride: 107 mmol/L (ref 98–111)
Creatinine: 1.53 mg/dL — ABNORMAL HIGH (ref 0.61–1.24)
GFR, Estimated: 41 mL/min — ABNORMAL LOW (ref >60)
Glucose, Bld: 102 mg/dL — ABNORMAL HIGH (ref 70–99)
Potassium: 3.9 mmol/L (ref 3.5–5.1)
Sodium: 137 mmol/L (ref 135–145)
Total Bilirubin: 0.5 mg/dL (ref 0.3–1.2)
Total Protein: 8 g/dL (ref 6.5–8.1)

## 2022-02-02 LAB — LACTATE DEHYDROGENASE: LDH: 166 U/L (ref 98–192)

## 2022-02-02 NOTE — Progress Notes (Signed)
?    [place] ?Telephone:([phone]   [phone] ? ?OFFICE PROGRESS NOTE ? ?[name], MD ?[address] ?[place] [address] ? ?DIAGNOSIS: Myeloproliferative disorder with positive JAK-2 mutation. ? ?PRIOR THERAPY: None. ? ?CURRENT THERAPY: Hydrea 500 mg by mouth daily. Status post approximately 38 months months of treatment.  The dose of Hydrea was increased to 500 mg twice a day on 04/03/2018.  Status post 48 months of this increased dose.  His dose is decreased back to 500 mg p.o. daily in February 2023. ? ?INTERVAL HISTORY: ?Adam Tapia 88 y.o. male returns to the clinic today for follow-up visit.  The patient is feeling fine today with no concerning complaints.  He decreased his dose of hydroxyurea to 500 mg p.o. daily and months ago after he had several episodes of diarrhea.  His diarrhea improved.  He denied having any current chest pain, shortness of breath, cough or hemoptysis.  He denied having any fever or chills.  He has no nausea, vomiting, diarrhea or constipation.  He has no headache or visual changes.  He is still very active and doing a lot of gardening.  The patient is here today for evaluation and repeat blood work. ? ?ALLERGIES:  is allergic to nsaids. ? ?MEDICATIONS:  ?Current Outpatient Medications  ?Medication Sig Dispense Refill  ? acetaminophen (TYLENOL) 325 MG tablet Take 650 mg by mouth every 6 (six) hours as needed for moderate pain.     ? allopurinol (ZYLOPRIM) 100 MG tablet TAKE 1 TABLET BY MOUTH EVERY DAY 90 tablet 1  ? amLODipine (NORVASC) 5 MG tablet Take 5 mg by mouth 2 (two) times daily.     ? atenolol (TENORMIN) 25 MG tablet Take 25 mg by mouth daily.  5  ? candesartan (ATACAND) 8 MG tablet Take 8 mg by mouth daily.    ? esomeprazole (NEXIUM) 20 MG capsule     ? fexofenadine (ALLEGRA) 180 MG tablet Take 180 mg by mouth daily.    ? fluticasone (FLONASE) 50 MCG/ACT nasal spray Place 1 spray into both nostrils daily as needed for  allergies.     ? hydroxyurea (HYDREA) 500 MG capsule TAKE 1 CAPSULE BY MOUTH TWICE A DAY MAY TAKE WITH FOOD TO MINIMIZE SIDE EFFECTS 60 capsule 2  ? indomethacin (INDOCIN) 50 MG capsule Take 1 capsule (50 mg total) by mouth 3 (three) times daily as needed. 21 capsule 0  ? ketotifen (ZADITOR) 0.025 % ophthalmic solution 1 drop 2 (two) times daily.    ? LAGEVRIO 200 MG CAPS SMARTSIG:4 Capsule(s) By Mouth Every 12 Hours    ? promethazine-dextromethorphan (PROMETHAZINE-DM) 6.25-15 MG/5ML syrup Take 2.5-5 mLs by mouth 4 (four) times daily as needed for cough. 118 mL 0  ? triamterene-hydrochlorothiazide (MAXZIDE-25) 37.5-25 MG per tablet Take 0.5 tablets by mouth daily.     ? TURMERIC PO Take 1 tablet by mouth daily.    ? ?No current facility-administered medications for this visit.  ? ? ?REVIEW OF SYSTEMS:  A comprehensive review of systems was negative except for: Constitutional: positive for fatigue  ? ?PHYSICAL EXAMINATION: General appearance: alert, cooperative, and no distress ?Head: Normocephalic, without obvious abnormality, atraumatic ?Neck: no adenopathy ?Lymph nodes: Cervical, supraclavicular, and axillary nodes normal. ?Resp: clear to auscultation bilaterally ?Back: symmetric, no curvature. ROM normal. No CVA tenderness. ?Cardio: regular rate and rhythm, S1, S2 normal, no murmur, click, rub or gallop ?GI: soft, non-tender; bowel sounds normal; no masses,  no organomegaly ?Extremities: extremities  normal, atraumatic, no cyanosis or edema ? ?ECOG PERFORMANCE STATUS: 1 - Symptomatic but completely ambulatory ? ?Blood pressure (!) 148/72, pulse 65, temperature 97.6 ?F (36.4 ?C), temperature source Tympanic, resp. rate 18, weight 199 lb 2 oz (90.3 kg), SpO2 97 %. ? ?LABORATORY DATA: ?Lab Results  ?Component Value Date  ? WBC 9.6 02/02/2022  ? HGB 13.5 02/02/2022  ? HCT 37.9 (L) 02/02/2022  ? MCV 124.7 (H) 02/02/2022  ? PLT 269 02/02/2022  ? ? ?  Chemistry   ?   ?Component Value Date/Time  ? NA 137 02/02/2022 0955   ? NA 138 10/05/2017 1018  ? K 3.9 02/02/2022 0955  ? K 4.4 10/05/2017 1018  ? CL 107 02/02/2022 0955  ? CO2 25 02/02/2022 0955  ? CO2 25 10/05/2017 1018  ? BUN 25 (H) 02/02/2022 0955  ? BUN 21.8 10/05/2017 1018  ? CREATININE 1.53 (H) 02/02/2022 0955  ? CREATININE 1.3 10/05/2017 1018  ? GLU 88 03/17/2016 0000  ?    ?Component Value Date/Time  ? CALCIUM 9.8 02/02/2022 0955  ? CALCIUM 9.9 10/05/2017 1018  ? ALKPHOS 64 02/02/2022 0955  ? ALKPHOS 154 (H) 10/05/2017 1018  ? AST 17 02/02/2022 0955  ? AST 18 10/05/2017 1018  ? ALT 9 02/02/2022 0955  ? ALT 13 10/05/2017 1018  ? BILITOT 0.5 02/02/2022 0955  ? BILITOT 0.45 10/05/2017 1018  ?  ? ? ? ?RADIOGRAPHIC STUDIES: ?No results found. ? ?ASSESSMENT AND PLAN:  ?This is a very pleasant 88 years old African-American male with myeloproliferative disorder and persistent leukocytosis and thrombocytosis with positive JAK 2 mutation. ?The patient is currently on treatment with hydroxyurea 500 mg p.o. daily status post 38 months. ?His treatment was changed to 500 mg p.o. twice daily in May 2019, status post 48 months of treatment. ?Because of persistent diarrhea his dose was decreased to 500 mg p.o. daily in February 2023 and the patient has been tolerating it fairly well. ?CBC today is unremarkable for any abnormalities except for mild anemia.  His platelets count is good.  I recommended for the patient to continue his current treatment with hydroxyurea 500 mg p.o. daily. ?I will see him back for follow-up visit in 3 months for evaluation and repeat blood work. ?He was advised to call immediately if he has any other concerning symptoms in the interval. ? ?The patient voices understanding of current disease status and treatment options and is in agreement with the current care plan. ?All questions were answered. The patient knows to call the clinic with any problems, questions or concerns. We can certainly see the patient much sooner if necessary. ? ?Disclaimer: This note was  dictated with voice recognition software. Similar sounding words can inadvertently be transcribed and may not be corrected upon review. ? ? ?  ?  ?

## 2022-06-03 ENCOUNTER — Telehealth: Payer: Self-pay | Admitting: Internal Medicine

## 2022-06-03 NOTE — Telephone Encounter (Signed)
Per 7/14 phone line pt called to schedule appointment.  Appointment was scheduled per pt request

## 2022-06-27 ENCOUNTER — Inpatient Hospital Stay (HOSPITAL_BASED_OUTPATIENT_CLINIC_OR_DEPARTMENT_OTHER): Payer: Medicare Other | Admitting: Internal Medicine

## 2022-06-27 ENCOUNTER — Other Ambulatory Visit: Payer: Self-pay | Admitting: Internal Medicine

## 2022-06-27 ENCOUNTER — Inpatient Hospital Stay: Payer: Medicare Other | Attending: Internal Medicine

## 2022-06-27 VITALS — BP 136/72 | HR 58 | Temp 98.2°F | Resp 18 | Wt 190.2 lb

## 2022-06-27 DIAGNOSIS — D471 Chronic myeloproliferative disease: Secondary | ICD-10-CM | POA: Diagnosis present

## 2022-06-27 DIAGNOSIS — D75839 Thrombocytosis, unspecified: Secondary | ICD-10-CM | POA: Diagnosis not present

## 2022-06-27 DIAGNOSIS — R197 Diarrhea, unspecified: Secondary | ICD-10-CM

## 2022-06-27 DIAGNOSIS — Z79899 Other long term (current) drug therapy: Secondary | ICD-10-CM | POA: Insufficient documentation

## 2022-06-27 DIAGNOSIS — D72829 Elevated white blood cell count, unspecified: Secondary | ICD-10-CM | POA: Diagnosis not present

## 2022-06-27 LAB — CMP (CANCER CENTER ONLY)
ALT: 9 U/L (ref 0–44)
AST: 17 U/L (ref 15–41)
Albumin: 3.8 g/dL (ref 3.5–5.0)
Alkaline Phosphatase: 91 U/L (ref 38–126)
Anion gap: 5 (ref 5–15)
BUN: 33 mg/dL — ABNORMAL HIGH (ref 8–23)
CO2: 24 mmol/L (ref 22–32)
Calcium: 9.4 mg/dL (ref 8.9–10.3)
Chloride: 107 mmol/L (ref 98–111)
Creatinine: 1.52 mg/dL — ABNORMAL HIGH (ref 0.61–1.24)
GFR, Estimated: 41 mL/min — ABNORMAL LOW (ref >60)
Glucose, Bld: 99 mg/dL (ref 70–99)
Potassium: 4 mmol/L (ref 3.5–5.1)
Sodium: 136 mmol/L (ref 135–145)
Total Bilirubin: 0.6 mg/dL (ref 0.3–1.2)
Total Protein: 7.9 g/dL (ref 6.5–8.1)

## 2022-06-27 LAB — CBC WITH DIFFERENTIAL (CANCER CENTER ONLY)
Abs Immature Granulocytes: 0.29 10*3/uL — ABNORMAL HIGH (ref 0.00–0.07)
Basophils Absolute: 0.1 10*3/uL (ref 0.0–0.1)
Basophils Relative: 0 %
Eosinophils Absolute: 0.1 10*3/uL (ref 0.0–0.5)
Eosinophils Relative: 1 %
HCT: 39.5 % (ref 39.0–52.0)
Hemoglobin: 14.1 g/dL (ref 13.0–17.0)
Immature Granulocytes: 2 %
Lymphocytes Relative: 14 %
Lymphs Abs: 2.8 10*3/uL (ref 0.7–4.0)
MCH: 38.2 pg — ABNORMAL HIGH (ref 26.0–34.0)
MCHC: 35.7 g/dL (ref 30.0–36.0)
MCV: 107 fL — ABNORMAL HIGH (ref 80.0–100.0)
Monocytes Absolute: 1.1 10*3/uL — ABNORMAL HIGH (ref 0.1–1.0)
Monocytes Relative: 6 %
Neutro Abs: 15.6 10*3/uL — ABNORMAL HIGH (ref 1.7–7.7)
Neutrophils Relative %: 77 %
Platelet Count: 346 10*3/uL (ref 150–400)
RBC: 3.69 MIL/uL — ABNORMAL LOW (ref 4.22–5.81)
RDW: 14.8 % (ref 11.5–15.5)
WBC Count: 20 10*3/uL — ABNORMAL HIGH (ref 4.0–10.5)
nRBC: 0 % (ref 0.0–0.2)

## 2022-06-27 LAB — LACTATE DEHYDROGENASE: LDH: 172 U/L (ref 98–192)

## 2022-06-27 MED ORDER — CIPROFLOXACIN HCL 250 MG PO TABS: 500.0000 mg | ORAL_TABLET | Freq: Two times a day (BID) | ORAL | 0 refills | Status: DC

## 2022-06-27 MED ORDER — CIPROFLOXACIN HCL 250 MG PO TABS: 250.0000 mg | ORAL_TABLET | Freq: Two times a day (BID) | ORAL | 0 refills | Status: DC

## 2022-06-27 MED ORDER — CIPROFLOXACIN HCL 500 MG PO TABS: 500.0000 mg | ORAL_TABLET | Freq: Two times a day (BID) | ORAL | 0 refills | Status: DC

## 2022-06-27 NOTE — Progress Notes (Signed)
[facility] [phone]   Fax:(336) [place], MD [place] [address]  DIAGNOSIS: Myeloproliferative disorder with positive JAK-2 mutation.  PRIOR THERAPY: None.  CURRENT THERAPY: Hydrea 500 mg by mouth daily. Status post approximately 38 months months of treatment.  The dose of Hydrea was increased to 500 mg twice a day on 04/03/2018.  Status post 48 months of this increased dose.  His dose is decreased back to 500 mg p.o. daily in February 2023.  INTERVAL HISTORY: Adam Tapia 88 y.o. male returns to the clinic today for follow-up visit accompanied by his wife.  The patient is feeling fine today with no concerning complaints except for intermittent diarrhea started 1-2 weeks ago.  He does not take any medication for it.  He denied having any associated nausea, vomiting or abdominal pain.  He denied having any headache or visual changes.  He has no recent weight loss or night sweats.  He has no chest pain, shortness of breath, cough or hemoptysis.  He has been tolerating his treatment with hydroxyurea fairly well but he has been taking it 500 mg p.o. daily the last few weeks.  He is here today for evaluation and repeat blood work.  ALLERGIES:  is allergic to nsaids.  MEDICATIONS:  Current Outpatient Medications  Medication Sig Dispense Refill   acetaminophen (TYLENOL) 325 MG tablet Take 650 mg by mouth every 6 (six) hours as needed for moderate pain.      allopurinol (ZYLOPRIM) 100 MG tablet TAKE 1 TABLET BY MOUTH EVERY DAY 90 tablet 1   amLODipine (NORVASC) 5 MG tablet Take 5 mg by mouth 2 (two) times daily.      atenolol (TENORMIN) 25 MG tablet Take 25 mg by mouth daily.  5   candesartan (ATACAND) 8 MG tablet Take 8 mg by mouth daily.     esomeprazole (NEXIUM) 20 MG capsule      fexofenadine (ALLEGRA) 180 MG tablet Take 180 mg by mouth daily.     fluticasone (FLONASE) 50 MCG/ACT nasal spray  Place 1 spray into both nostrils daily as needed for allergies.      hydroxyurea (HYDREA) 500 MG capsule TAKE 1 CAPSULE BY MOUTH TWICE A DAY MAY TAKE WITH FOOD TO MINIMIZE SIDE EFFECTS 60 capsule 2   indomethacin (INDOCIN) 50 MG capsule Take 1 capsule (50 mg total) by mouth 3 (three) times daily as needed. 21 capsule 0   ketotifen (ZADITOR) 0.025 % ophthalmic solution 1 drop 2 (two) times daily.     LAGEVRIO 200 MG CAPS SMARTSIG:4 Capsule(s) By Mouth Every 12 Hours     promethazine-dextromethorphan (PROMETHAZINE-DM) 6.25-15 MG/5ML syrup Take 2.5-5 mLs by mouth 4 (four) times daily as needed for cough. 118 mL 0   triamterene-hydrochlorothiazide (MAXZIDE-25) 37.5-25 MG per tablet Take 0.5 tablets by mouth daily.      TURMERIC PO Take 1 tablet by mouth daily.     No current facility-administered medications for this visit.    REVIEW OF SYSTEMS:  A comprehensive review of systems was negative except for: Constitutional: positive for fatigue Gastrointestinal: positive for diarrhea   PHYSICAL EXAMINATION: General appearance: alert, cooperative, fatigued, and no distress Head: Normocephalic, without obvious abnormality, atraumatic Neck: no adenopathy Lymph nodes: Cervical, supraclavicular, and axillary nodes normal. Resp: clear to auscultation bilaterally Back: symmetric, no curvature. ROM normal. No CVA tenderness. Cardio: regular rate and rhythm, S1, S2 normal, no murmur, click, rub  or gallop GI: soft, non-tender; bowel sounds normal; no masses,  no organomegaly Extremities: extremities normal, atraumatic, no cyanosis or edema  ECOG PERFORMANCE STATUS: 1 - Symptomatic but completely ambulatory  Blood pressure 136/72, pulse (!) 58, temperature 98.2 F (36.8 C), temperature source Oral, resp. rate 18, weight 190 lb 4 oz (86.3 kg), SpO2 99 %.  LABORATORY DATA: Lab Results  Component Value Date   WBC 9.6 02/02/2022   HGB 13.5 02/02/2022   HCT 37.9 (L) 02/02/2022   MCV 124.7 (H) 02/02/2022    PLT 269 02/02/2022      Chemistry      Component Value Date/Time   NA 137 02/02/2022 0955   NA 138 10/05/2017 1018   K 3.9 02/02/2022 0955   K 4.4 10/05/2017 1018   CL 107 02/02/2022 0955   CO2 25 02/02/2022 0955   CO2 25 10/05/2017 1018   BUN 25 (H) 02/02/2022 0955   BUN 21.8 10/05/2017 1018   CREATININE 1.53 (H) 02/02/2022 0955   CREATININE 1.3 10/05/2017 1018   GLU 88 03/17/2016 0000      Component Value Date/Time   CALCIUM 9.8 02/02/2022 0955   CALCIUM 9.9 10/05/2017 1018   ALKPHOS 64 02/02/2022 0955   ALKPHOS 154 (H) 10/05/2017 1018   AST 17 02/02/2022 0955   AST 18 10/05/2017 1018   ALT 9 02/02/2022 0955   ALT 13 10/05/2017 1018   BILITOT 0.5 02/02/2022 0955   BILITOT 0.45 10/05/2017 1018       RADIOGRAPHIC STUDIES: No results found.  ASSESSMENT AND PLAN:  This is a very pleasant 88 years old African-American male with myeloproliferative disorder and persistent leukocytosis and thrombocytosis with positive JAK 2 mutation. The patient is currently on treatment with hydroxyurea 500 mg p.o. daily status post 38 months. His treatment was changed to 500 mg p.o. twice daily in May 2019, status post 51 months of treatment. Because of persistent diarrhea his dose was decreased to 500 mg p.o. daily in February 2023 and the patient has been tolerating it fairly well. For the diarrhea and the elevated white blood count, I will check his stool for C. difficile as well as parasite and ova and stool culture. I recommended for the patient to continue his current treatment with hydroxyurea 500 mg p.o. twice daily. For the persistent diarrhea and questionable C. difficile, I will start the patient empirically on Cipro 500 mg p.o. twice daily for 7 days. I will see him back for follow-up visit in 3 months for evaluation and repeat blood work. The patient voices understanding of current disease status and treatment options and is in agreement with the current care plan. All  questions were answered. The patient knows to call the clinic with any problems, questions or concerns. We can certainly see the patient much sooner if necessary.  Disclaimer: This note was dictated with voice recognition software. Similar sounding words can inadvertently be transcribed and may not be corrected upon review.

## 2022-06-28 DIAGNOSIS — D471 Chronic myeloproliferative disease: Secondary | ICD-10-CM | POA: Diagnosis not present

## 2022-06-28 LAB — C DIFFICILE QUICK SCREEN W PCR REFLEX
C Diff antigen: NEGATIVE
C Diff interpretation: NOT DETECTED
C Diff toxin: NEGATIVE

## 2022-06-30 LAB — O&P RESULT

## 2022-06-30 LAB — OVA + PARASITE EXAM

## 2022-07-01 ENCOUNTER — Telehealth: Payer: Self-pay

## 2022-07-01 NOTE — Telephone Encounter (Signed)
Pt and his wife called needing to clarify his rx of Cipro. Pt advises he was given two bottles and each are different doses. One is '500mg'$  capsules and the other are '250mg'$ .  I have reviewed the records from his 06/27/22 visit which states "For the persistent diarrhea and questionable C. difficile, I will start the patient empirically on Cipro 500 mg p.o. twice daily for 7 days". I then reviewed the lab results from his stool specimen. It was negative for c.diff as was the stool culture, however, the campylobacter is still pending.  Pt describes his diarrhea as occasional, occurring once in each occurrence 3-4 times a week. He advised it does not happen multiple times a day or several days in a row.  I reviewed this information with Cassandra, PA-C and pt was advised not to take the medication at this time. Pt and his wife expressed understanding of this information and pt expressed his thankfulness to not have to take the medication. I advised the pt we would call him back if for any reason his pending results come back positive.

## 2022-07-02 LAB — STOOL CULTURE REFLEX - CMPCXR

## 2022-07-02 LAB — STOOL CULTURE: E coli, Shiga toxin Assay: NEGATIVE

## 2022-07-02 LAB — STOOL CULTURE REFLEX - RSASHR

## 2022-08-10 ENCOUNTER — Other Ambulatory Visit: Payer: Self-pay | Admitting: Physician Assistant

## 2022-08-10 DIAGNOSIS — D471 Chronic myeloproliferative disease: Secondary | ICD-10-CM

## 2022-09-07 ENCOUNTER — Telehealth: Payer: Self-pay | Admitting: Internal Medicine

## 2022-09-07 NOTE — Telephone Encounter (Signed)
Called patient regarding upcoming November appointments, left a voicemail.  

## 2022-09-26 ENCOUNTER — Inpatient Hospital Stay (HOSPITAL_BASED_OUTPATIENT_CLINIC_OR_DEPARTMENT_OTHER): Payer: Medicare Other | Admitting: Internal Medicine

## 2022-09-26 ENCOUNTER — Inpatient Hospital Stay: Payer: Medicare Other | Attending: Internal Medicine

## 2022-09-26 VITALS — BP 162/73 | HR 62 | Temp 98.2°F | Wt 192.6 lb

## 2022-09-26 DIAGNOSIS — D471 Chronic myeloproliferative disease: Secondary | ICD-10-CM

## 2022-09-26 DIAGNOSIS — I10 Essential (primary) hypertension: Secondary | ICD-10-CM | POA: Insufficient documentation

## 2022-09-26 DIAGNOSIS — Z79899 Other long term (current) drug therapy: Secondary | ICD-10-CM | POA: Diagnosis not present

## 2022-09-26 DIAGNOSIS — R197 Diarrhea, unspecified: Secondary | ICD-10-CM

## 2022-09-26 LAB — CMP (CANCER CENTER ONLY)
ALT: 8 U/L (ref 0–44)
AST: 15 U/L (ref 15–41)
Albumin: 3.7 g/dL (ref 3.5–5.0)
Alkaline Phosphatase: 69 U/L (ref 38–126)
Anion gap: 4 — ABNORMAL LOW (ref 5–15)
BUN: 31 mg/dL — ABNORMAL HIGH (ref 8–23)
CO2: 27 mmol/L (ref 22–32)
Calcium: 9.3 mg/dL (ref 8.9–10.3)
Chloride: 107 mmol/L (ref 98–111)
Creatinine: 1.45 mg/dL — ABNORMAL HIGH (ref 0.61–1.24)
GFR, Estimated: 44 mL/min — ABNORMAL LOW (ref >60)
Glucose, Bld: 96 mg/dL (ref 70–99)
Potassium: 4.1 mmol/L (ref 3.5–5.1)
Sodium: 138 mmol/L (ref 135–145)
Total Bilirubin: 0.5 mg/dL (ref 0.3–1.2)
Total Protein: 7.8 g/dL (ref 6.5–8.1)

## 2022-09-26 LAB — CBC WITH DIFFERENTIAL (CANCER CENTER ONLY)
Abs Immature Granulocytes: 0.08 10*3/uL — ABNORMAL HIGH (ref 0.00–0.07)
Basophils Absolute: 0.1 10*3/uL (ref 0.0–0.1)
Basophils Relative: 1 %
Eosinophils Absolute: 0 10*3/uL (ref 0.0–0.5)
Eosinophils Relative: 0 %
HCT: 35.1 % — ABNORMAL LOW (ref 39.0–52.0)
Hemoglobin: 12.5 g/dL — ABNORMAL LOW (ref 13.0–17.0)
Immature Granulocytes: 1 %
Lymphocytes Relative: 22 %
Lymphs Abs: 2.5 10*3/uL (ref 0.7–4.0)
MCH: 43.4 pg — ABNORMAL HIGH (ref 26.0–34.0)
MCHC: 35.6 g/dL (ref 30.0–36.0)
MCV: 121.9 fL — ABNORMAL HIGH (ref 80.0–100.0)
Monocytes Absolute: 1.1 10*3/uL — ABNORMAL HIGH (ref 0.1–1.0)
Monocytes Relative: 10 %
Neutro Abs: 7.6 10*3/uL (ref 1.7–7.7)
Neutrophils Relative %: 66 %
Platelet Count: 265 10*3/uL (ref 150–400)
RBC: 2.88 MIL/uL — ABNORMAL LOW (ref 4.22–5.81)
RDW: 17.4 % — ABNORMAL HIGH (ref 11.5–15.5)
Smear Review: NORMAL
WBC Count: 11.4 10*3/uL — ABNORMAL HIGH (ref 4.0–10.5)
nRBC: 0 % (ref 0.0–0.2)

## 2022-09-26 LAB — LACTATE DEHYDROGENASE: LDH: 168 U/L (ref 98–192)

## 2022-09-26 NOTE — Progress Notes (Signed)
[facility] Telephone:([phone]   Fax:(336) [place], MD [place] [address]  DIAGNOSIS: Myeloproliferative disorder with positive JAK-2 mutation.  PRIOR THERAPY: None.  CURRENT THERAPY: Hydrea 500 mg by mouth daily. Status post approximately 38 months months of treatment.  The dose of Hydrea was increased to 500 mg twice a day on 04/03/2018.  Status post 48 months of this increased dose.  His dose is decreased back to 500 mg p.o. daily in February 2023.  INTERVAL HISTORY: Adam Tapia 88 y.o. male returns to the clinic today for follow-up visit.  He was accompanied by his wife.  The patient is feeling fine today with no concerning complaints except for mild fatigue.  He denied having any chest pain, shortness of breath, cough or hemoptysis.  He denied having any fever or chills.  He has no nausea, vomiting, diarrhea or constipation.  He has no headache or visual changes.  He denied having any recent weight loss or night sweats.  He continues to tolerate his treatment with hydroxyurea fairly well.  He is here today for evaluation and repeat blood work.  ALLERGIES:  is allergic to nsaids.  MEDICATIONS:  Current Outpatient Medications  Medication Sig Dispense Refill   acetaminophen (TYLENOL) 325 MG tablet Take 650 mg by mouth every 6 (six) hours as needed for moderate pain.      allopurinol (ZYLOPRIM) 100 MG tablet TAKE 1 TABLET BY MOUTH EVERY DAY 90 tablet 1   amLODipine (NORVASC) 5 MG tablet Take 5 mg by mouth 2 (two) times daily.      atenolol (TENORMIN) 25 MG tablet Take 25 mg by mouth daily.  5   candesartan (ATACAND) 8 MG tablet Take 8 mg by mouth daily.     ciprofloxacin (CIPRO) 250 MG tablet Take 1 tablet (250 mg total) by mouth 2 (two) times daily. 14 tablet 0   esomeprazole (NEXIUM) 20 MG capsule      fexofenadine (ALLEGRA) 180 MG tablet Take 180 mg by mouth daily.     fluticasone (FLONASE) 50  MCG/ACT nasal spray Place 1 spray into both nostrils daily as needed for allergies.      hydroxyurea (HYDREA) 500 MG capsule TAKE 1 CAPSULE BY MOUTH TWICE A DAY MAY TAKE WITH FOOD TO MINIMIZE SIDE EFFECTS 60 capsule 2   indomethacin (INDOCIN) 50 MG capsule Take 1 capsule (50 mg total) by mouth 3 (three) times daily as needed. 21 capsule 0   ketotifen (ZADITOR) 0.025 % ophthalmic solution 1 drop 2 (two) times daily.     LAGEVRIO 200 MG CAPS SMARTSIG:4 Capsule(s) By Mouth Every 12 Hours     promethazine-dextromethorphan (PROMETHAZINE-DM) 6.25-15 MG/5ML syrup Take 2.5-5 mLs by mouth 4 (four) times daily as needed for cough. 118 mL 0   triamterene-hydrochlorothiazide (MAXZIDE-25) 37.5-25 MG per tablet Take 0.5 tablets by mouth daily.      TURMERIC PO Take 1 tablet by mouth daily.     No current facility-administered medications for this visit.    REVIEW OF SYSTEMS:  A comprehensive review of systems was negative except for: Constitutional: positive for fatigue   PHYSICAL EXAMINATION: General appearance: alert, cooperative, fatigued, and no distress Head: Normocephalic, without obvious abnormality, atraumatic Neck: no adenopathy Lymph nodes: Cervical, supraclavicular, and axillary nodes normal. Resp: clear to auscultation bilaterally Back: symmetric, no curvature. ROM normal. No CVA tenderness. Cardio: regular rate and rhythm, S1, S2 normal, no murmur, click,  rub or gallop GI: soft, non-tender; bowel sounds normal; no masses,  no organomegaly Extremities: extremities normal, atraumatic, no cyanosis or edema  ECOG PERFORMANCE STATUS: 1 - Symptomatic but completely ambulatory  Blood pressure (!) 162/73, pulse 62, temperature 98.2 F (36.8 C), temperature source Tympanic, weight 192 lb 9.6 oz (87.4 kg), SpO2 97 %.  LABORATORY DATA: Lab Results  Component Value Date   WBC 20.0 (H) 06/27/2022   HGB 14.1 06/27/2022   HCT 39.5 06/27/2022   MCV 107.0 (H) 06/27/2022   PLT 346 06/27/2022       Chemistry      Component Value Date/Time   NA 136 06/27/2022 1015   NA 138 10/05/2017 1018   K 4.0 06/27/2022 1015   K 4.4 10/05/2017 1018   CL 107 06/27/2022 1015   CO2 24 06/27/2022 1015   CO2 25 10/05/2017 1018   BUN 33 (H) 06/27/2022 1015   BUN 21.8 10/05/2017 1018   CREATININE 1.52 (H) 06/27/2022 1015   CREATININE 1.3 10/05/2017 1018   GLU 88 03/17/2016 0000      Component Value Date/Time   CALCIUM 9.4 06/27/2022 1015   CALCIUM 9.9 10/05/2017 1018   ALKPHOS 91 06/27/2022 1015   ALKPHOS 154 (H) 10/05/2017 1018   AST 17 06/27/2022 1015   AST 18 10/05/2017 1018   ALT 9 06/27/2022 1015   ALT 13 10/05/2017 1018   BILITOT 0.6 06/27/2022 1015   BILITOT 0.45 10/05/2017 1018       RADIOGRAPHIC STUDIES: No results found.  ASSESSMENT AND PLAN:  This is a very pleasant 88 years old African-American male with myeloproliferative disorder and persistent leukocytosis and thrombocytosis with positive JAK 2 mutation. The patient is currently on treatment with hydroxyurea 500 mg p.o. daily status post 38 months. His treatment was changed to 500 mg p.o. twice daily in May 2019, status post 57 months of treatment. The patient has been tolerating this treatment well with no concerning complaints. Repeat CBC today showed improvement in his total white blood count after the C. difficile infection but he has mild anemia.  His platelets count are within the normal range. I recommended for him to continue on the same treatment with hydroxyurea 500 mg p.o. twice daily. I will see him back for follow-up visit in 3 months for evaluation and repeat blood work. For the hypertension he will continue to take his blood pressure medication and to discuss with his primary care physician adjustment of his dose. He was advised to call immediately if he has any concerning symptoms in the interval. The patient voices understanding of current disease status and treatment options and is in agreement with the  current care plan. All questions were answered. The patient knows to call the clinic with any problems, questions or concerns. We can certainly see the patient much sooner if necessary.  Disclaimer: This note was dictated with voice recognition software. Similar sounding words can inadvertently be transcribed and may not be corrected upon review.

## 2022-10-17 ENCOUNTER — Telehealth: Payer: Self-pay

## 2022-10-17 NOTE — Telephone Encounter (Signed)
This patient called stating that he needs a refill on his amlodipine.  This nurse advised that his primary care physician is the one who manages his blood pressure medication.  Patient states that he does not have a primary care physician right now.  This nurse advised that his request will be forwarded to the provider and if they authorize the refill it will be submitted to his pharmacy.  Patient acknowledged understanding.  No further concerns at this time.

## 2022-11-23 ENCOUNTER — Other Ambulatory Visit: Payer: Self-pay | Admitting: Physician Assistant

## 2022-11-23 DIAGNOSIS — D471 Chronic myeloproliferative disease: Secondary | ICD-10-CM

## 2022-12-23 ENCOUNTER — Telehealth: Payer: Self-pay | Admitting: Internal Medicine

## 2022-12-23 NOTE — Telephone Encounter (Signed)
Called patient regarding upcoming February appointments, left a voicemail.

## 2022-12-26 ENCOUNTER — Inpatient Hospital Stay (HOSPITAL_BASED_OUTPATIENT_CLINIC_OR_DEPARTMENT_OTHER): Payer: Medicare Other | Admitting: Internal Medicine

## 2022-12-26 ENCOUNTER — Other Ambulatory Visit: Payer: Self-pay

## 2022-12-26 ENCOUNTER — Inpatient Hospital Stay: Payer: Medicare Other | Attending: Internal Medicine

## 2022-12-26 VITALS — BP 137/67 | HR 58 | Temp 97.7°F | Resp 16 | Wt 189.9 lb

## 2022-12-26 DIAGNOSIS — D72829 Elevated white blood cell count, unspecified: Secondary | ICD-10-CM | POA: Insufficient documentation

## 2022-12-26 DIAGNOSIS — D471 Chronic myeloproliferative disease: Secondary | ICD-10-CM

## 2022-12-26 DIAGNOSIS — D75839 Thrombocytosis, unspecified: Secondary | ICD-10-CM | POA: Insufficient documentation

## 2022-12-26 LAB — CBC WITH DIFFERENTIAL (CANCER CENTER ONLY)
Abs Immature Granulocytes: 0.1 10*3/uL — ABNORMAL HIGH (ref 0.00–0.07)
Basophils Absolute: 0.1 10*3/uL (ref 0.0–0.1)
Basophils Relative: 1 %
Eosinophils Absolute: 0 10*3/uL (ref 0.0–0.5)
Eosinophils Relative: 0 %
HCT: 35.4 % — ABNORMAL LOW (ref 39.0–52.0)
Hemoglobin: 12.9 g/dL — ABNORMAL LOW (ref 13.0–17.0)
Immature Granulocytes: 1 %
Lymphocytes Relative: 23 %
Lymphs Abs: 2.5 10*3/uL (ref 0.7–4.0)
MCH: 45.9 pg — ABNORMAL HIGH (ref 26.0–34.0)
MCHC: 36.4 g/dL — ABNORMAL HIGH (ref 30.0–36.0)
MCV: 126 fL — ABNORMAL HIGH (ref 80.0–100.0)
Monocytes Absolute: 1 10*3/uL (ref 0.1–1.0)
Monocytes Relative: 9 %
Neutro Abs: 7.1 10*3/uL (ref 1.7–7.7)
Neutrophils Relative %: 66 %
Platelet Count: 295 10*3/uL (ref 150–400)
RBC: 2.81 MIL/uL — ABNORMAL LOW (ref 4.22–5.81)
RDW: 12 % (ref 11.5–15.5)
Smear Review: NORMAL
WBC Count: 10.8 10*3/uL — ABNORMAL HIGH (ref 4.0–10.5)
nRBC: 0 % (ref 0.0–0.2)

## 2022-12-26 LAB — CMP (CANCER CENTER ONLY)
ALT: 8 U/L (ref 0–44)
AST: 13 U/L — ABNORMAL LOW (ref 15–41)
Albumin: 3.7 g/dL (ref 3.5–5.0)
Alkaline Phosphatase: 65 U/L (ref 38–126)
Anion gap: 5 (ref 5–15)
BUN: 33 mg/dL — ABNORMAL HIGH (ref 8–23)
CO2: 25 mmol/L (ref 22–32)
Calcium: 9.3 mg/dL (ref 8.9–10.3)
Chloride: 105 mmol/L (ref 98–111)
Creatinine: 1.56 mg/dL — ABNORMAL HIGH (ref 0.61–1.24)
GFR, Estimated: 40 mL/min — ABNORMAL LOW (ref >60)
Glucose, Bld: 98 mg/dL (ref 70–99)
Potassium: 4 mmol/L (ref 3.5–5.1)
Sodium: 135 mmol/L (ref 135–145)
Total Bilirubin: 0.4 mg/dL (ref 0.3–1.2)
Total Protein: 7.4 g/dL (ref 6.5–8.1)

## 2022-12-26 LAB — LACTATE DEHYDROGENASE: LDH: 147 U/L (ref 98–192)

## 2022-12-26 NOTE — Addendum Note (Signed)
Addended by: Ardeen Garland on: 12/26/2022 02:51 PM   Modules accepted: Orders

## 2022-12-26 NOTE — Progress Notes (Signed)
[facility] [phone]   Fax:([phone]) [place], [place] [address]  DIAGNOSIS: Myeloproliferative disorder with positive JAK-2 mutation.  PRIOR THERAPY: None.  CURRENT THERAPY: Hydrea 500 mg by mouth daily. Status post approximately 38 months months of treatment.  The dose of Hydrea was increased to 500 mg twice a day on 04/03/2018.  Status post 48 months of this increased dose.  His dose is decreased back to 500 mg p.o. daily in February 2023.  INTERVAL HISTORY: Adam Tapia 89 y.o. male returns to the clinic today for follow-up visit accompanied by his wife.  The patient is feeling fine today with no concerning complaints.  He denied having any chest pain, shortness of breath, cough or hemoptysis.  He has no nausea, vomiting, diarrhea or constipation.  He has no headache or visual changes.  He denied having any recent weight loss or night sweats.  The patient continues to tolerate his treatment with hydroxyurea fairly well.  He is here today for evaluation and repeat blood work.  ALLERGIES:  is allergic to nsaids.  MEDICATIONS:  Current Outpatient Medications  Medication Sig Dispense Refill   acetaminophen (TYLENOL) 325 MG tablet Take 650 mg by mouth every 6 (six) hours as needed for moderate pain.      allopurinol (ZYLOPRIM) 100 MG tablet TAKE 1 TABLET BY MOUTH EVERY DAY 90 tablet 1   amLODipine (NORVASC) 5 MG tablet Take 5 mg by mouth 2 (two) times daily.      atenolol (TENORMIN) 25 MG tablet Take 25 mg by mouth daily.  5   candesartan (ATACAND) 8 MG tablet Take 8 mg by mouth daily.     ciprofloxacin (CIPRO) 250 MG tablet Take 1 tablet (250 mg total) by mouth 2 (two) times daily. 14 tablet 0   esomeprazole (NEXIUM) 20 MG capsule      fexofenadine (ALLEGRA) 180 MG tablet Take 180 mg by mouth daily.     fluticasone (FLONASE) 50 MCG/ACT nasal spray Place 1 spray into both nostrils daily as  needed for allergies.      hydroxyurea (HYDREA) 500 MG capsule TAKE 1 CAPSULE BY MOUTH TWICE A DAY (MAY TAKE WITH FOOD TO MINIMIZE SIDE EFFECTS) 60 capsule 2   indomethacin (INDOCIN) 50 MG capsule Take 1 capsule (50 mg total) by mouth 3 (three) times daily as needed. 21 capsule 0   ketotifen (ZADITOR) 0.025 % ophthalmic solution 1 drop 2 (two) times daily.     LAGEVRIO 200 MG CAPS SMARTSIG:4 Capsule(s) By Mouth Every 12 Hours     promethazine-dextromethorphan (PROMETHAZINE-DM) 6.25-15 MG/5ML syrup Take 2.5-5 mLs by mouth 4 (four) times daily as needed for cough. 118 mL 0   triamterene-hydrochlorothiazide (MAXZIDE-25) 37.5-25 MG per tablet Take 0.5 tablets by mouth daily.      TURMERIC PO Take 1 tablet by mouth daily.     No current facility-administered medications for this visit.    REVIEW OF SYSTEMS:  A comprehensive review of systems was negative except for: Constitutional: positive for fatigue   PHYSICAL EXAMINATION: General appearance: alert, cooperative, and no distress Head: Normocephalic, without obvious abnormality, atraumatic Neck: no adenopathy Lymph nodes: Cervical, supraclavicular, and axillary nodes normal. Resp: clear to auscultation bilaterally Back: symmetric, no curvature. ROM normal. No CVA tenderness. Cardio: regular rate and rhythm, S1, S2 normal, no murmur, click, rub or gallop GI: soft, non-tender; bowel sounds normal; no masses,  no organomegaly Extremities:  extremities normal, atraumatic, no cyanosis or edema  ECOG PERFORMANCE STATUS: 1 - Symptomatic but completely ambulatory  Blood pressure 137/67, pulse (!) 58, temperature 97.7 F (36.5 C), temperature source Oral, resp. rate 16, weight 189 lb 14.4 oz (86.1 kg), SpO2 100 %.  LABORATORY DATA: Lab Results  Component Value Date   WBC 10.8 (H) 12/26/2022   HGB 12.9 (L) 12/26/2022   HCT 35.4 (L) 12/26/2022   MCV 126.0 (H) 12/26/2022   PLT 295 12/26/2022      Chemistry      Component Value Date/Time   NA  135 12/26/2022 1313   NA 138 10/05/2017 1018   K 4.0 12/26/2022 1313   K 4.4 10/05/2017 1018   CL 105 12/26/2022 1313   CO2 25 12/26/2022 1313   CO2 25 10/05/2017 1018   BUN 33 (H) 12/26/2022 1313   BUN 21.8 10/05/2017 1018   CREATININE 1.56 (H) 12/26/2022 1313   CREATININE 1.3 10/05/2017 1018   GLU 88 03/17/2016 0000      Component Value Date/Time   CALCIUM 9.3 12/26/2022 1313   CALCIUM 9.9 10/05/2017 1018   ALKPHOS 65 12/26/2022 1313   ALKPHOS 154 (H) 10/05/2017 1018   AST 13 (L) 12/26/2022 1313   AST 18 10/05/2017 1018   ALT 8 12/26/2022 1313   ALT 13 10/05/2017 1018   BILITOT 0.4 12/26/2022 1313   BILITOT 0.45 10/05/2017 1018       RADIOGRAPHIC STUDIES: No results found.  ASSESSMENT AND PLAN:  This is a very pleasant 89 years old African-American male with myeloproliferative disorder and persistent leukocytosis and thrombocytosis with positive JAK 2 mutation. The patient is currently on treatment with hydroxyurea 500 mg p.o. daily status post 38 months. His treatment was changed to 500 mg p.o. twice daily in May 2019, status post 60 months of treatment. The patient continues to tolerate this treatment fairly well. His CBC is unremarkable except for mild leukocytosis as well as mild anemia. I recommended for him to continue his current treatment with hydroxyurea with the same dose. I will see him back for follow-up visit in 3 months for evaluation with repeat blood work. He was advised to call immediately if he has any other concerning symptoms in the interval. The patient voices understanding of current disease status and treatment options and is in agreement with the current care plan. All questions were answered. The patient knows to call the clinic with any problems, questions or concerns. We can certainly see the patient much sooner if necessary.  Disclaimer: This note was dictated with voice recognition software. Similar sounding words can inadvertently be  transcribed and may not be corrected upon review.

## 2023-01-17 ENCOUNTER — Encounter (HOSPITAL_COMMUNITY): Payer: Self-pay | Admitting: Emergency Medicine

## 2023-01-17 ENCOUNTER — Observation Stay (HOSPITAL_COMMUNITY)
Admission: EM | Admit: 2023-01-17 | Discharge: 2023-01-17 | Disposition: A | Discharge status: Home / Self Care | Payer: Medicare Other | Attending: Internal Medicine | Admitting: Internal Medicine

## 2023-01-17 ENCOUNTER — Other Ambulatory Visit: Payer: Self-pay

## 2023-01-17 ENCOUNTER — Emergency Department (HOSPITAL_COMMUNITY): Payer: Medicare Other

## 2023-01-17 DIAGNOSIS — N289 Disorder of kidney and ureter, unspecified: Secondary | ICD-10-CM | POA: Insufficient documentation

## 2023-01-17 DIAGNOSIS — T7840XA Allergy, unspecified, initial encounter: Secondary | ICD-10-CM

## 2023-01-17 DIAGNOSIS — Z96642 Presence of left artificial hip joint: Secondary | ICD-10-CM | POA: Diagnosis not present

## 2023-01-17 DIAGNOSIS — Z1152 Encounter for screening for COVID-19: Secondary | ICD-10-CM | POA: Insufficient documentation

## 2023-01-17 DIAGNOSIS — I129 Hypertensive chronic kidney disease with stage 1 through stage 4 chronic kidney disease, or unspecified chronic kidney disease: Secondary | ICD-10-CM | POA: Diagnosis not present

## 2023-01-17 DIAGNOSIS — X58XXXA Exposure to other specified factors, initial encounter: Secondary | ICD-10-CM | POA: Diagnosis not present

## 2023-01-17 DIAGNOSIS — I4891 Unspecified atrial fibrillation: Secondary | ICD-10-CM | POA: Insufficient documentation

## 2023-01-17 DIAGNOSIS — D471 Chronic myeloproliferative disease: Secondary | ICD-10-CM | POA: Diagnosis present

## 2023-01-17 DIAGNOSIS — R0602 Shortness of breath: Secondary | ICD-10-CM | POA: Insufficient documentation

## 2023-01-17 DIAGNOSIS — R197 Diarrhea, unspecified: Secondary | ICD-10-CM | POA: Insufficient documentation

## 2023-01-17 DIAGNOSIS — N1832 Chronic kidney disease, stage 3b: Secondary | ICD-10-CM | POA: Insufficient documentation

## 2023-01-17 DIAGNOSIS — Z79899 Other long term (current) drug therapy: Secondary | ICD-10-CM | POA: Insufficient documentation

## 2023-01-17 DIAGNOSIS — D539 Nutritional anemia, unspecified: Secondary | ICD-10-CM

## 2023-01-17 DIAGNOSIS — D72829 Elevated white blood cell count, unspecified: Secondary | ICD-10-CM

## 2023-01-17 DIAGNOSIS — T783XXA Angioneurotic edema, initial encounter: Secondary | ICD-10-CM | POA: Diagnosis not present

## 2023-01-17 DIAGNOSIS — T781XXA Other adverse food reactions, not elsewhere classified, initial encounter: Secondary | ICD-10-CM | POA: Diagnosis not present

## 2023-01-17 DIAGNOSIS — T782XXA Anaphylactic shock, unspecified, initial encounter: Secondary | ICD-10-CM

## 2023-01-17 DIAGNOSIS — I10 Essential (primary) hypertension: Secondary | ICD-10-CM | POA: Diagnosis present

## 2023-01-17 DIAGNOSIS — R220 Localized swelling, mass and lump, head: Secondary | ICD-10-CM | POA: Diagnosis present

## 2023-01-17 HISTORY — DX: Anaphylactic shock, unspecified, initial encounter: T78.2XXA

## 2023-01-17 LAB — BASIC METABOLIC PANEL
Anion gap: 10 (ref 5–15)
BUN: 36 mg/dL — ABNORMAL HIGH (ref 8–23)
CO2: 19 mmol/L — ABNORMAL LOW (ref 22–32)
Calcium: 8.8 mg/dL — ABNORMAL LOW (ref 8.9–10.3)
Chloride: 105 mmol/L (ref 98–111)
Creatinine, Ser: 1.81 mg/dL — ABNORMAL HIGH (ref 0.61–1.24)
GFR, Estimated: 34 mL/min — ABNORMAL LOW (ref >60)
Glucose, Bld: 125 mg/dL — ABNORMAL HIGH (ref 70–99)
Potassium: 3.9 mmol/L (ref 3.5–5.1)
Sodium: 134 mmol/L — ABNORMAL LOW (ref 135–145)

## 2023-01-17 LAB — CBC WITH DIFFERENTIAL/PLATELET
Abs Immature Granulocytes: 0 10*3/uL (ref 0.00–0.07)
Basophils Absolute: 0 10*3/uL (ref 0.0–0.1)
Basophils Relative: 0 %
Eosinophils Absolute: 0 10*3/uL (ref 0.0–0.5)
Eosinophils Relative: 0 %
HCT: 35.4 % — ABNORMAL LOW (ref 39.0–52.0)
Hemoglobin: 12.9 g/dL — ABNORMAL LOW (ref 13.0–17.0)
Lymphocytes Relative: 3 %
Lymphs Abs: 0.8 10*3/uL (ref 0.7–4.0)
MCH: 45.7 pg — ABNORMAL HIGH (ref 26.0–34.0)
MCHC: 36.4 g/dL — ABNORMAL HIGH (ref 30.0–36.0)
MCV: 125.5 fL — ABNORMAL HIGH (ref 80.0–100.0)
Monocytes Absolute: 1.5 10*3/uL — ABNORMAL HIGH (ref 0.1–1.0)
Monocytes Relative: 6 %
Neutro Abs: 22.9 10*3/uL — ABNORMAL HIGH (ref 1.7–7.7)
Neutrophils Relative %: 91 %
Platelets: 304 10*3/uL (ref 150–400)
RBC: 2.82 MIL/uL — ABNORMAL LOW (ref 4.22–5.81)
RDW: 12.6 % (ref 11.5–15.5)
WBC: 25.2 10*3/uL — ABNORMAL HIGH (ref 4.0–10.5)
nRBC: 0 % (ref 0.0–0.2)
nRBC: 0 /100 WBC

## 2023-01-17 LAB — RESP PANEL BY RT-PCR (RSV, FLU A&B, COVID)  RVPGX2
Influenza A by PCR: NEGATIVE
Influenza B by PCR: NEGATIVE
Resp Syncytial Virus by PCR: NEGATIVE
SARS Coronavirus 2 by RT PCR: NEGATIVE

## 2023-01-17 MED ORDER — LACTATED RINGERS IV SOLN: INTRAVENOUS | Status: AC

## 2023-01-17 MED ORDER — ENOXAPARIN SODIUM 30 MG/0.3ML IJ SOSY
30.0000 mg | PREFILLED_SYRINGE | INTRAMUSCULAR | Status: DC
  Administered 2023-01-17: 30 mg via SUBCUTANEOUS
  Filled 2023-01-17: qty 0.3

## 2023-01-17 MED ORDER — ALLOPURINOL 100 MG PO TABS: 100.0000 mg | ORAL_TABLET | Freq: Every day | ORAL | Status: DC | PRN

## 2023-01-17 MED ORDER — ATENOLOL 25 MG PO TABS
25.0000 mg | ORAL_TABLET | Freq: Every day | ORAL | Status: DC
  Administered 2023-01-17: 25 mg via ORAL
  Filled 2023-01-17: qty 1

## 2023-01-17 MED ORDER — DIPHENHYDRAMINE HCL 50 MG/ML IJ SOLN
25.0000 mg | Freq: Once | INTRAMUSCULAR | Status: AC
  Administered 2023-01-17: 25 mg via INTRAVENOUS
  Filled 2023-01-17: qty 1

## 2023-01-17 MED ORDER — LORATADINE 10 MG PO TABS
10.0000 mg | ORAL_TABLET | Freq: Two times a day (BID) | ORAL | Status: DC
  Administered 2023-01-17: 10 mg via ORAL
  Filled 2023-01-17: qty 1

## 2023-01-17 MED ORDER — ACETAMINOPHEN 650 MG RE SUPP: 650.0000 mg | Freq: Four times a day (QID) | RECTAL | Status: DC | PRN

## 2023-01-17 MED ORDER — KETOTIFEN FUMARATE 0.025 % OP SOLN: 1.0000 [drp] | Freq: Every day | OPHTHALMIC | Status: DC | PRN

## 2023-01-17 MED ORDER — FAMOTIDINE IN NACL 20-0.9 MG/50ML-% IV SOLN
20.0000 mg | Freq: Once | INTRAVENOUS | Status: AC
  Administered 2023-01-17: 20 mg via INTRAVENOUS
  Filled 2023-01-17: qty 50

## 2023-01-17 MED ORDER — METHYLPREDNISOLONE SODIUM SUCC 125 MG IJ SOLR
125.0000 mg | Freq: Once | INTRAMUSCULAR | Status: AC
  Administered 2023-01-17: 125 mg via INTRAVENOUS
  Filled 2023-01-17: qty 2

## 2023-01-17 MED ORDER — ONDANSETRON HCL 4 MG/2ML IJ SOLN: 4.0000 mg | Freq: Four times a day (QID) | INTRAMUSCULAR | Status: DC | PRN

## 2023-01-17 MED ORDER — IPRATROPIUM-ALBUTEROL 0.5-2.5 (3) MG/3ML IN SOLN
3.0000 mL | Freq: Once | RESPIRATORY_TRACT | Status: AC
  Administered 2023-01-17: 3 mL via RESPIRATORY_TRACT
  Filled 2023-01-17: qty 3

## 2023-01-17 MED ORDER — PREDNISONE 20 MG PO TABS
40.0000 mg | ORAL_TABLET | Freq: Every day | ORAL | Status: DC
  Administered 2023-01-17: 40 mg via ORAL
  Filled 2023-01-17: qty 2

## 2023-01-17 MED ORDER — EPINEPHRINE 0.3 MG/0.3ML IJ SOAJ: 0.3000 mg | Freq: Once | INTRAMUSCULAR | Status: DC | PRN

## 2023-01-17 MED ORDER — IRBESARTAN 75 MG PO TABS
75.0000 mg | ORAL_TABLET | Freq: Every day | ORAL | Status: DC
  Filled 2023-01-17: qty 1

## 2023-01-17 MED ORDER — EPINEPHRINE 0.3 MG/0.3ML IJ SOAJ
0.3000 mg | Freq: Once | INTRAMUSCULAR | Status: AC
  Administered 2023-01-17: 0.3 mg via INTRAMUSCULAR
  Filled 2023-01-17: qty 0.3

## 2023-01-17 MED ORDER — CALCIUM GLUCONATE-NACL 1-0.675 GM/50ML-% IV SOLN
1.0000 g | Freq: Once | INTRAVENOUS | Status: AC
  Administered 2023-01-17: 1000 mg via INTRAVENOUS
  Filled 2023-01-17: qty 50

## 2023-01-17 MED ORDER — PREDNISONE 20 MG PO TABS: 40.0000 mg | ORAL_TABLET | ORAL | 0 refills | Status: AC

## 2023-01-17 MED ORDER — HYDROXYUREA 500 MG PO CAPS: 500.0000 mg | ORAL_CAPSULE | Freq: Two times a day (BID) | ORAL | Status: DC

## 2023-01-17 MED ORDER — SODIUM CHLORIDE 0.9% FLUSH
3.0000 mL | Freq: Two times a day (BID) | INTRAVENOUS | Status: DC
  Administered 2023-01-17: 3 mL via INTRAVENOUS

## 2023-01-17 MED ORDER — ALBUTEROL SULFATE (2.5 MG/3ML) 0.083% IN NEBU: 2.5000 mg | INHALATION_SOLUTION | Freq: Four times a day (QID) | RESPIRATORY_TRACT | Status: DC | PRN

## 2023-01-17 MED ORDER — ONDANSETRON HCL 4 MG/2ML IJ SOLN
4.0000 mg | Freq: Once | INTRAMUSCULAR | Status: AC
  Administered 2023-01-17: 4 mg via INTRAVENOUS
  Filled 2023-01-17: qty 2

## 2023-01-17 MED ORDER — ONDANSETRON HCL 4 MG PO TABS: 4.0000 mg | ORAL_TABLET | Freq: Four times a day (QID) | ORAL | Status: DC | PRN

## 2023-01-17 MED ORDER — ACETAMINOPHEN 325 MG PO TABS: 650.0000 mg | ORAL_TABLET | Freq: Four times a day (QID) | ORAL | Status: DC | PRN

## 2023-01-17 MED ORDER — AMLODIPINE BESYLATE 5 MG PO TABS
5.0000 mg | ORAL_TABLET | Freq: Every day | ORAL | Status: DC
  Administered 2023-01-17: 5 mg via ORAL
  Filled 2023-01-17: qty 1

## 2023-01-17 NOTE — ED Provider Notes (Signed)
[facility] Provider Note   CSN: [id number] Arrival date & time: 01/17/23  0019     History  Chief Complaint  Patient presents with   Facial Swelling    Adam Tapia is a 89 y.o. male.  HPI Patient with history of hypertension, myeloproliferative disorder presents with allergic reaction Patient reports he ate some peanuts and some popcorn and about an hour later started having tongue swelling and difficulty swallowing.  He also reports diarrhea.  No rash or itching that is new.  No chest pain or shortness of breath. He had otherwise been at his baseline.  No new medicines.   Past Medical History:  Diagnosis Date   Acute blood loss anemia    AF (atrial fibrillation) (HCC)    Allergic rhinitis    Anemia    Arthritis    BPH (benign prostatic hyperplasia)    Constipation    Dysrhythmia    PT STATES HAS HX A FIB - FOLLOWED BY DR.KEVIN LITTLE   GERD (gastroesophageal reflux disease)    OCCASIONAL - HAS NEXIUM IF NEEDED - NOT CURRENTLY TAKING   Hypertension    Myeloproliferative disorder ([facility])    [facility]   Nocturia    Primary osteoarthritis of left hip    Psoriasis    Unsteady gait     Home Medications Prior to Admission medications   Medication Sig Start Date End Date Taking? Authorizing Provider  acetaminophen (TYLENOL) 325 MG tablet Take 650 mg by mouth every 6 (six) hours as needed for moderate pain.     [provider]  allopurinol (ZYLOPRIM) 100 MG tablet TAKE 1 TABLET BY MOUTH EVERY DAY 02/10/21   Curt Bears, MD  amLODipine (NORVASC) 5 MG tablet Take 5 mg by mouth 2 (two) times daily.     [provider]  atenolol (TENORMIN) 25 MG tablet Take 25 mg by mouth daily. 07/18/15   [provider]  candesartan (ATACAND) 8 MG tablet Take 8 mg by mouth daily. 06/26/19   [provider]  ciprofloxacin (CIPRO) 250 MG tablet Take 1 tablet (250 mg total) by  mouth 2 (two) times daily. 06/27/22   Curt Bears, MD  esomeprazole ([place]) 20 MG capsule  02/26/16   [provider]  fexofenadine (ALLEGRA) 180 MG tablet Take 180 mg by mouth daily.    [provider]  fluticasone (FLONASE) 50 MCG/ACT nasal spray Place 1 spray into both nostrils daily as needed for allergies.  05/30/14   [provider]  hydroxyurea (HYDREA) 500 MG capsule TAKE 1 CAPSULE BY MOUTH TWICE A DAY (MAY TAKE WITH FOOD TO MINIMIZE SIDE EFFECTS) 11/23/22   Curt Bears, MD  indomethacin (INDOCIN) 50 MG capsule Take 1 capsule (50 mg total) by mouth 3 (three) times daily as needed. 04/03/18   Curt Bears, MD  ketotifen (ZADITOR) 0.025 % ophthalmic solution 1 drop 2 (two) times daily.    [provider]  promethazine-dextromethorphan (PROMETHAZINE-DM) 6.25-15 MG/5ML syrup Take 2.5-5 mLs by mouth 4 (four) times daily as needed for cough. 05/23/21   Coral Spikes, DO  triamterene-hydrochlorothiazide (MAXZIDE-25) 37.5-25 MG per tablet Take 0.5 tablets by mouth daily.     [provider]  TURMERIC PO Take 1 tablet by mouth daily.    [provider]      Allergies    Nsaids    Review of Systems   Review of Systems  Constitutional:  Negative for fever.  Respiratory:  Negative for shortness of breath.   Cardiovascular:  Negative for chest pain.  Gastrointestinal:  Positive for diarrhea.  Skin:  Negative for rash.    Physical Exam Updated Vital Signs BP (!) 112/59   Pulse 87   Temp 99.6 F (37.6 C) (Oral)   Resp 15   Ht 1.727 m ('5\' 8"'$ )   Wt 86.1 kg   SpO2 99%   BMI 28.86 kg/m  Physical Exam CONSTITUTIONAL: Elderly, appears younger than stated age HEAD: Normocephalic/atraumatic ENMT: Mucous membranes moist Both lips are edematous.  Tongue is enlarged, but there is no uvular edema, uvula midline.  No drooling or stridor. There is no dental abscess noted NECK: supple no meningeal signs CV: S1/S2 noted, no  murmurs/rubs/gallops noted LUNGS: Lungs are clear to auscultation bilaterally, no apparent distress ABDOMEN: soft NEURO: Pt is awake/alert/appropriate, moves all extremitiesx4.  No facial droop.   SKIN: warm, color normal PSYCH: no abnormalities of mood noted, alert and oriented to situation  ED Results / Procedures / Treatments   Labs (all labs ordered are listed, but only abnormal results are displayed) Labs Reviewed  CBC WITH DIFFERENTIAL/PLATELET - Abnormal; Notable for the following components:      Result Value   WBC 25.2 (*)    RBC 2.82 (*)    Hemoglobin 12.9 (*)    HCT 35.4 (*)    MCV 125.5 (*)    MCH 45.7 (*)    MCHC 36.4 (*)    Neutro Abs 22.9 (*)    Monocytes Absolute 1.5 (*)    All other components within normal limits  BASIC METABOLIC PANEL - Abnormal; Notable for the following components:   Sodium 134 (*)    CO2 19 (*)    Glucose, Bld 125 (*)    BUN 36 (*)    Creatinine, Ser 1.81 (*)    Calcium 8.8 (*)    GFR, Estimated 34 (*)    All other components within normal limits  RESP PANEL BY RT-PCR (RSV, FLU A&B, COVID)  [id number]    EKG EKG Interpretation  Date/Time:  Tuesday January 17 2023 01:05:21 EST Ventricular Rate:  92 PR Interval:  176 QRS Duration: 158 QT Interval:  374 QTC Calculation: 463 R Axis:   1 Text Interpretation: Sinus rhythm Atrial premature complex Right bundle branch block Confirmed by Ripley Fraise [phone]) on 01/17/2023 1:12:21 AM  Radiology DG Chest Port 1 View  Result Date: 01/17/2023 CLINICAL DATA:  Shortness of breath. EXAM: PORTABLE CHEST 1 VIEW COMPARISON:  05/23/2021. FINDINGS: The heart is enlarged and the mediastinal contour is within normal limits. Lung volumes are low with airspace opacities at the lung bases. No effusion or pneumothorax. No acute osseous abnormality. IMPRESSION: 1. Low lung volumes with atelectasis or infiltrate at the lung bases. 2. Cardiomegaly. Electronically Signed   By: Brett Fairy M.D.   On:  01/17/2023 03:39    Procedures .Critical Care  Performed by: Ripley Fraise, MD Authorized by: Ripley Fraise, MD   Critical care provider statement:    Critical care time (minutes):  40   Critical care start time:  01/17/2023 1:00 AM   Critical care end time:  01/17/2023 1:40 AM   Critical care time was exclusive of:  Separately billable procedures and treating other patients   Critical care was necessary to treat or prevent imminent or life-threatening deterioration of the following conditions:  Respiratory failure and shock   Critical care was time spent personally by me  on the following activities:  Re-evaluation of patient's condition, ordering and review of laboratory studies, development of treatment plan with patient or surrogate, evaluation of patient's response to treatment and examination of patient   I assumed direction of critical care for this patient from another provider in my specialty: no     Care discussed with: admitting provider       Medications Ordered in ED Medications  acetaminophen (TYLENOL) tablet 650 mg (has no administration in time range)    Or  acetaminophen (TYLENOL) suppository 650 mg (has no administration in time range)  lactated ringers infusion (has no administration in time range)  diphenhydrAMINE (BENADRYL) injection 25 mg (25 mg Intravenous Given 01/17/23 0111)  ondansetron (ZOFRAN) injection 4 mg (4 mg Intravenous Given 01/17/23 0111)  methylPREDNISolone sodium succinate (SOLU-MEDROL) 125 mg/2 mL injection 125 mg (125 mg Intravenous Given 01/17/23 0111)  famotidine (PEPCID) IVPB 20 mg premix (0 mg Intravenous Stopped 01/17/23 0156)  EPINEPHrine (EPI-PEN) injection 0.3 mg (0.3 mg Intramuscular Given 01/17/23 0109)  ipratropium-albuterol (DUONEB) 0.5-2.5 (3) MG/3ML nebulizer solution 3 mL (3 mLs Nebulization Given 01/17/23 0225)    ED Course/ Medical Decision Making/ A&P Clinical Course as of 01/17/23 0516  Tue Jan 17, 2023  0111 Patient presents  for angioedema.  This occurred after eating candy.  He has tongue and lip swelling, but is protecting his airway and otherwise in no acute distress.  Strong suspicion this is allergic in nature.  Medications have been ordered [DW]  0205 Pt improving, tongue appears less swollen  [DW]  0214 WBC(!): 25.2 Leukocytosis [DW]  0222 Patient had some mild hypoxia, reports it is hard to swallow.  On my exam he is in no acute distress, pulse ox above 90%.  Lungs are clear.  No increase in his swelling, I am still able to visualize his uvula.  Will give a DuoNeb, check a chest x-ray.  Will lean towards admission given his age. [DW]  0325 Creatinine(!): 1.81 Acute kidney injury [DW]  0425 Patient without any worsening, but has not significantly improved over the past hour.  He does appear better after neb treatment.  Given his age, comorbidities and appearance, I feel he would benefit from admission [DW]  0515 Discussed with Dr. Velia Meyer for admission [DW]    Clinical Course User Index [DW] Ripley Fraise, MD                             Medical Decision Making Amount and/or Complexity of Data Reviewed Labs:  Decision-making details documented in ED Course. Radiology: ordered.  Risk Prescription drug management. Decision regarding hospitalization.   This patient presents to the ED for concern of angioedema, this involves an extensive number of treatment options, and is a complaint that carries with it a high risk of complications and morbidity.  The differential diagnosis includes but is not limited to drug-induced angioedema, anaphylaxis, oral abscess  Comorbidities that complicate the patient evaluation: Patient's presentation is complicated by their history of hypertension  Additional history obtained: Additional history obtained from family  Lab Tests: I Ordered, and personally interpreted labs.  The pertinent results include:  leukocytosis (likely related to his underlying  myeloproliferative disease) acute kidney injury   Cardiac Monitoring: The patient was maintained on a cardiac monitor.  I personally viewed and interpreted the cardiac monitor which showed an underlying rhythm of:  sinus rhythm  Medicines ordered and prescription drug management: I ordered medication including epinephrine,  Benadryl, solu- Medrol for angioedema Reevaluation of the patient after these medicines showed that the patient    improved  Critical Interventions:  epinephrine  Consultations Obtained: I requested consultation with the admitting physician triad , and discussed  findings as well as pertinent plan - they recommend: admit  Reevaluation: After the interventions noted above, I reevaluated the patient and found that they have :improved  Complexity of problems addressed: Patient's presentation is most consistent with  acute presentation with potential threat to life or bodily function  Disposition: After consideration of the diagnostic results and the patient's response to treatment,  I feel that the patent would benefit from admission   .           Final Clinical Impression(s) / ED Diagnoses Final diagnoses:  Angioedema, initial encounter  Allergic reaction, initial encounter    Rx / DC Orders ED Discharge Orders     None         Ripley Fraise, MD 01/17/23 [phone]

## 2023-01-17 NOTE — Discharge Summary (Addendum)
Physician Discharge Summary   Patient: Adam Tapia MRN: [id number] DOB: [birth year]  Admit date:     01/17/2023  Discharge date: 01/17/23  Discharge Physician: Norval Morton   PCP: [name], MD   Recommendations at discharge:   Please follow-up with your primary care provider to be evaluated for need of referral to allergy Discharge Diagnoses: Active Problems:   Leukocytosis   Myeloproliferative disorder (HCC)   Renal insufficiency   Hypocalcemia   Essential hypertension   Macrocytic anemia  Resolved Problems:   *Anaphylactic reaction*  Hospital Course: Adam Tapia is a 89 y.o. male with medical history significant of hypertension, atrial fibrillation, myeloproliferative disorder, CKD stage IIIb, and BPH who presents with with complaints of tongue and mouth swelling started yesterday evening.  History is obtained from the patient who lives at home with his wife, walks with the use of a cane, and still performs all of his ADLs.  Yesterday evening he recalls eating popcorn, peanuts, and little piece of hard candy that he found.  Patient seems to think it was a hard candy that he thinks had cinnamon in it because soon thereafter he reported that his mouth felt sore followed by swelling including his tongue.  He started having difficulty swallowing which he became concerned.  Noted associated symptoms of nausea and some diarrhea.  Denies having any episodes of vomiting, rash, itching ,shortness of breath, or chest pain.  Denies any recent medication changes or new medications.  Patient did report having similar episode like this less than a month ago while eating peanut brittle with Cajun spice that likely had cinnamon in it.  At that time he did not have a primary care provider and did not require medical attention as symptoms were less severe.  His daughter and wife report that specific incident was thought secondary to a infection in his tooth, but patient adamantly denies this.    In the emergency department patient was given  epinephrine 0.3 mg IM, Solu-Medrol 125 mg IV, Benadryl 25 mg IV, Pepcid 20 mg IV, DuoNeb breathing treatment, and Zofran IV.  He did not require subsequent doses of epinephrine and swelling subsequently resolved throughout the day.  Assessment and Plan: Anaphylactic reaction Patient had eaten some popcorn, peanuts, and hard candy which he believes had cinnamon prior to developing tongue swelling.  Reports similar symptoms have been less than 1 month ago after eating peanut brittle with some kind of Cajun spice that he believes has cinnamon in it as well. Patient receivedepinephrine 0.3 mg IM, Solu-Medrol 125 mg IV, Benadryl 25 mg IV, Pepcid 20 mg IV.  Patient did not require subsequent doses of epinephrine.  Patient was prescribed prednisone taper over the next 4 days and recommended to continue antihistamine.  Myeloproliferative disorder with positive JAK2 mutation Leukocytosis Acute on chronic.  White blood cell count that is acutely elevated in the setting of allergic reaction.  Patient is followed by Dr. Julien Nordmann of oncology in the outpatient setting.  Last office visit on 2/5 where patient was recommended to continue hydroxyurea  500 mg daily. -Continue outpatient follow-up with Dr. Julien Nordmann   Renal insufficiency Creatinine 1.81 with BUN 36.  Baseline creatinine appears to be around 1.4-1.6.  Patient has been given normal saline IV fluids at 75 mL/h following the emergency department.   Hypocalcemia Acute.  Calcium noted to be just mildly low at 8.8.  Albumin recently checked and noted to be within normal limits.  Calcium level was replaced with 1  g of calcium recommended.   Essential hypertension Blood pressures currently maintained 104/58 -126/51. -Resumed home blood pressure medications as tolerated   Macrocytic anemia Hemoglobin appears stable at 12.9 with elevated MCV similar to priors.         Consultants: None Procedures  performed: none Disposition: Home Diet recommendation:  Discharge Diet Orders (From admission, onward)     Start     Ordered   01/17/23 0000  Diet - low sodium heart healthy        01/17/23 1449            DISCHARGE MEDICATION: Allergies as of 01/17/2023       Reactions   Nsaids Other (See Comments)        Medication List     STOP taking these medications    indomethacin 50 MG capsule Commonly known as: INDOCIN   triamterene-hydrochlorothiazide 37.5-25 MG tablet Commonly known as: MAXZIDE-25       TAKE these medications    acetaminophen 325 MG tablet Commonly known as: TYLENOL Take 650 mg by mouth daily as needed for moderate pain.   allopurinol 100 MG tablet Commonly known as: ZYLOPRIM TAKE 1 TABLET BY MOUTH EVERY DAY What changed:  when to take this reasons to take this   amLODipine 5 MG tablet Commonly known as: NORVASC Take 5 mg by mouth daily.   atenolol 25 MG tablet Commonly known as: TENORMIN Take 25 mg by mouth daily.   candesartan 8 MG tablet Commonly known as: ATACAND Take 8 mg by mouth daily.   fexofenadine 180 MG tablet Commonly known as: ALLEGRA Take 180 mg by mouth daily.   hydroxyurea 500 MG capsule Commonly known as: HYDREA TAKE 1 CAPSULE BY MOUTH TWICE A DAY (MAY TAKE WITH FOOD TO MINIMIZE SIDE EFFECTS)   ketotifen 0.025 % ophthalmic solution Commonly known as: ZADITOR Place 1 drop into both eyes daily as needed (itching eyes).   predniSONE 20 MG tablet Commonly known as: DELTASONE Take 2 tablets (40 mg total) by mouth See admin instructions for 4 days. Take prednisone 40 mg p.o. daily x 2 day, then take 20 mg p.o. daily for 2 days then stop   promethazine-dextromethorphan 6.25-15 MG/5ML syrup Commonly known as: PROMETHAZINE-DM Take 2.5-5 mLs by mouth 4 (four) times daily as needed for cough.   TURMERIC PO Take 1 tablet by mouth daily.        Discharge Exam: Filed Weights   01/17/23 0042  Weight: 86.1 kg    Exam  Constitutional: Elderly male in no acute distress Eyes: PERRL, lids and conjunctivae normal ENMT: Mucous membranes are moist.  No significant swelling of the lips. Neck: normal, supple, no stridor appreciated. Respiratory: clear to auscultation bilaterally, no wheezing, no crackles. Normal respiratory effort. No accessory muscle use.  Cardiovascular: Regular rate and rhythm, no murmurs / rubs / gallops. No extremity edema. 2+ pedal pulses.   Abdomen: no tenderness, no masses palpated. No hepatosplenomegaly. Bowel sounds positive.  Musculoskeletal: no clubbing / cyanosis. No joint deformity upper and lower extremities. Good ROM, no contractures. Normal muscle tone.  Skin: no rashes, lesions, ulcers. No induration Neurologic: CN 2-12 grossly intact.  Strength 5/5 in all 4.  Psychiatric: Normal judgment and insight. Alert and oriented x 3. Normal mood.    Condition at discharge: good  The results of significant diagnostics from this hospitalization (including imaging, microbiology, ancillary and laboratory) are listed below for reference.   Imaging Studies: DG Chest Port 1 View  Result Date:  01/17/2023 CLINICAL DATA:  Shortness of breath. EXAM: PORTABLE CHEST 1 VIEW COMPARISON:  05/23/2021. FINDINGS: The heart is enlarged and the mediastinal contour is within normal limits. Lung volumes are low with airspace opacities at the lung bases. No effusion or pneumothorax. No acute osseous abnormality. IMPRESSION: 1. Low lung volumes with atelectasis or infiltrate at the lung bases. 2. Cardiomegaly. Electronically Signed   By: Brett Fairy M.D.   On: 01/17/2023 03:39    Microbiology: Results for orders placed or performed during the hospital encounter of 01/17/23  Resp panel by RT-PCR (RSV, Flu A&B, Covid) Anterior Nasal Swab     Status: None   Collection Time: 01/17/23  2:24 AM   Specimen: Anterior Nasal Swab  Result Value Ref Range Status   SARS Coronavirus 2 by RT PCR NEGATIVE  NEGATIVE Final   Influenza A by PCR NEGATIVE NEGATIVE Final   Influenza B by PCR NEGATIVE NEGATIVE Final    Comment: (NOTE) The Xpert Xpress SARS-CoV-2/FLU/RSV plus assay is intended as an aid in the diagnosis of influenza from Nasopharyngeal swab specimens and should not be used as a sole basis for treatment. Nasal washings and aspirates are unacceptable for Xpert Xpress SARS-CoV-2/FLU/RSV testing.  Fact Sheet for Patients: EntrepreneurPulse.com.au  Fact Sheet for Healthcare Providers: IncredibleEmployment.be  This test is not yet approved or cleared by the [place] FDA and has been authorized for detection and/or diagnosis of SARS-CoV-2 by FDA under an Emergency Use Authorization (EUA). This EUA will remain in effect (meaning this test can be used) for the duration of the COVID-19 declaration under Section 564(b)(1) of the Act, 21 U.S.C. section 360bbb-3(b)(1), unless the authorization is terminated or revoked.     Resp Syncytial Virus by PCR NEGATIVE NEGATIVE Final    Comment: (NOTE) Fact Sheet for Patients: EntrepreneurPulse.com.au  Fact Sheet for Healthcare Providers: IncredibleEmployment.be  This test is not yet approved or cleared by the [place] FDA and has been authorized for detection and/or diagnosis of SARS-CoV-2 by FDA under an Emergency Use Authorization (EUA). This EUA will remain in effect (meaning this test can be used) for the duration of the COVID-19 declaration under Section 564(b)(1) of the Act, 21 U.S.C. section 360bbb-3(b)(1), unless the authorization is terminated or revoked.  Performed at [facility], [address], [place], [place] [address]     Labs: CBC: Recent Labs  Lab 01/17/23 0047  WBC 25.2*  NEUTROABS 22.9*  HGB 12.9*  HCT 35.4*  MCV 125.5*  PLT 123456   Basic Metabolic Panel: Recent Labs  Lab 01/17/23 0219  NA 134*  K 3.9  CL 105  CO2  19*  GLUCOSE 125*  BUN 36*  CREATININE 1.81*  CALCIUM 8.8*   Liver Function Tests: No results for input(s): "AST", "ALT", "ALKPHOS", "BILITOT", "PROT", "ALBUMIN" in the last 168 hours. CBG: No results for input(s): "GLUCAP" in the last 168 hours.  Discharge time spent: less than 30 minutes.  Signed: Norval Morton, MD [facility] 01/17/2023

## 2023-01-17 NOTE — H&P (Addendum)
History and Physical    Patient: Adam Tapia [id number] DOB: 04-12-1923 DOA: 01/17/2023 DOS: the patient was seen and examined on 01/17/2023 PCP: [name], MD  Patient coming from: Home  Chief Complaint:  Chief Complaint  Patient presents with   Facial Swelling   HPI: Adam Tapia is a 89 y.o. male with medical history significant of hypertension, atrial fibrillation, myeloproliferative disorder, CKD stage IIIb, and BPH who presents with with complaints of tongue and mouth swelling started yesterday evening.  History is obtained from the patient who lives at home with his wife, walks with the use of a cane, and still performs all of his ADLs.  Yesterday evening he recalls eating popcorn, peanuts, and little piece of hard candy that he found.  Patient seems to think it was a hard candy that he thinks had cinnamon in it because soon thereafter he reported that his mouth felt sore followed by swelling including his tongue.  He started having difficulty swallowing which he became concerned.  Noted associated symptoms of nausea and some diarrhea.  Denies having any episodes of vomiting, rash, shortness of breath, or chest pain.  Denies any recent medication changes or new medications.  Patient did report having similar episode like this less than a month ago while eating peanut brittle with Cajun spice that likely had cinnamon in it.  At that time he did not have a primary care provider and did not require medical attention as symptoms were less severe.  His daughter and wife report that incident was thought secondary to a infection in his tooth, but patient adamantly denies this.  In the emergency department patient was noted to have temperature of 100.1 F with tachycardia and all other vital signs relatively maintained.  Labs significant for WBC 25.2, hemoglobin 12.5,, sodium 134, BUN 36, creatinine 1.81, and calcium 8.8.  Chest x-ray noted low lung volumes with atelectasis or infiltrate  at the lung bases and cardiomegaly.  Patient has been given  epinephrine 0.3 mg IM, Solu-Medrol 125 mg IV, Benadryl 25 mg IV, Pepcid 20 mg IV, DuoNeb breathing treatment, and Zofran IV.   Review of Systems: As mentioned in the history of present illness. All other systems reviewed and are negative. Past Medical History:  Diagnosis Date   Acute blood loss anemia    AF (atrial fibrillation) (HCC)    Allergic rhinitis    Anemia    Arthritis    BPH (benign prostatic hyperplasia)    Constipation    Dysrhythmia    PT STATES HAS HX A FIB - FOLLOWED BY DR.KEVIN LITTLE   GERD (gastroesophageal reflux disease)    OCCASIONAL - HAS NEXIUM IF NEEDED - NOT CURRENTLY TAKING   Hypertension    Myeloproliferative disorder ([place])    [facility] DR[facility]   Nocturia    Primary osteoarthritis of left hip    Psoriasis    Unsteady gait    Past Surgical History:  Procedure Laterality Date   HERNIA REPAIR  1998   TOTAL HIP ARTHROPLASTY Left 03/11/2016   Procedure: LEFT TOTAL HIP ARTHROPLASTY ANTERIOR APPROACH;  Surgeon: Mcarthur Rossetti, MD;  Location: [facility];  Service: Orthopedics;  Laterality: Left;  Went from Spinal to an LMA   Social History:  reports that he has never smoked. He has never used smokeless tobacco. He reports that he does not drink alcohol and does not use drugs.  Allergies  Allergen Reactions   Nsaids Other (See Comments)  History reviewed. No pertinent family history.  Prior to Admission medications   Medication Sig Start Date End Date Taking? Authorizing Provider  acetaminophen (TYLENOL) 325 MG tablet Take 650 mg by mouth every 6 (six) hours as needed for moderate pain.     [provider]  allopurinol (ZYLOPRIM) 100 MG tablet TAKE 1 TABLET BY MOUTH EVERY DAY 02/10/21   Curt Bears, MD  amLODipine (NORVASC) 5 MG tablet Take 5 mg by mouth 2 (two) times daily.     [provider]  atenolol (TENORMIN) 25 MG tablet Take 25 mg by  mouth daily. 07/18/15   [provider]  candesartan (ATACAND) 8 MG tablet Take 8 mg by mouth daily. 06/26/19   [provider]  ciprofloxacin (CIPRO) 250 MG tablet Take 1 tablet (250 mg total) by mouth 2 (two) times daily. 06/27/22   Curt Bears, MD  esomeprazole ([place]) 20 MG capsule  02/26/16   [provider]  fexofenadine (ALLEGRA) 180 MG tablet Take 180 mg by mouth daily.    [provider]  fluticasone (FLONASE) 50 MCG/ACT nasal spray Place 1 spray into both nostrils daily as needed for allergies.  05/30/14   [provider]  hydroxyurea (HYDREA) 500 MG capsule TAKE 1 CAPSULE BY MOUTH TWICE A DAY (MAY TAKE WITH FOOD TO MINIMIZE SIDE EFFECTS) 11/23/22   Curt Bears, MD  indomethacin (INDOCIN) 50 MG capsule Take 1 capsule (50 mg total) by mouth 3 (three) times daily as needed. 04/03/18   Curt Bears, MD  ketotifen (ZADITOR) 0.025 % ophthalmic solution 1 drop 2 (two) times daily.    [provider]  promethazine-dextromethorphan (PROMETHAZINE-DM) 6.25-15 MG/5ML syrup Take 2.5-5 mLs by mouth 4 (four) times daily as needed for cough. 05/23/21   Coral Spikes, DO  triamterene-hydrochlorothiazide (MAXZIDE-25) 37.5-25 MG per tablet Take 0.5 tablets by mouth daily.     [provider]  TURMERIC PO Take 1 tablet by mouth daily.    [provider]    Physical Exam: Vitals:   01/17/23 0445 01/17/23 0500 01/17/23 0515 01/17/23 0530  BP: (!) 112/59 105/60 (!) 108/53 111/61  Pulse: 87 88 83 82  Resp: '15 20 19 18  '$ Temp:      TempSrc:      SpO2: 99% 99% 98% 97%  Weight:      Height:       Exam  Constitutional: Elderly male currently in no acute distress Eyes: PERRL, lids and conjunctivae normal ENMT: Mucous membranes are moist. Posterior pharynx clear of any exudate or lesions.  Neck: normal, supple, no stridor appreciated. Respiratory: clear to auscultation bilaterally, no wheezing, no crackles. Normal respiratory  effort. No accessory muscle use.  Cardiovascular: Regular rate and rhythm, no murmurs / rubs / gallops.  2+ pedal pulses.  s.  Abdomen: no tenderness, no masses palpated.  Bowel sounds positive.  Musculoskeletal: no clubbing / cyanosis. No joint deformity upper and lower extremities. Good ROM, no contractures. Normal muscle tone.  Skin: no rashes, lesions, ulcers. No induration Neurologic: CN 2-12 grossly intact.   5/5 in all 4.  Psychiatric: Normal judgment and insight. Alert and oriented x 3. Normal mood.   Data Reviewed:  EKG reveals sinus rhythm at 92 bpm with premature atrial complexes and right bundle branch block.  Reviewed labs, imaging and pertinent records as noted above in HPI. Assessment and Plan:  Anaphylactic reaction Patient had eaten some popcorn, peanuts, and hard candy which he believes had cinnamon prior to developing tongue  swelling.  Reports similar symptoms have been less than 1 month ago after eating peanut brittle with some kind of Cajun spice that he believes has cinnamon in it as well. Patient receivedepinephrine 0.3 mg IM, Solu-Medrol 125 mg IV, Benadryl 25 mg IV, Pepcid 20 mg IV. -Admit to a medical telemetry bed -Aspiration precautions with elevation head of bed -Monitoring for swelling -Cetirizine 10 mg twice daily -Prednisone 40 mg daily with plan to taper over the next 5 days -Pepcid -Normal saline IV fluids at 75 mL/h x 8 hours  Myeloproliferative disorder with positive JAK2 mutation Leukocytosis Acute on chronic.  White blood cell count that is acutely elevated in the setting of allergic reaction.  Patient is followed by Dr. Julien Nordmann of oncology in the outpatient setting.  Last office visit on 2/5 where patient was recommended to continue hydroxyurea  500 mg daily. -Continue hydroxyurea -Continue outpatient follow-up with Dr. Julien Nordmann  Renal insufficiency Creatinine 1.81 with BUN 36.  Baseline creatinine appears to be around 1.4-1.6.  Patient has been  started on normal saline IV fluids at 75 mL/h.  Hypocalcemia Acute.  Calcium noted to be just mildly low at 8.8.  Albumin recently checked and noted to be within normal limits. -Calcium gluconate 1 g IV  Essential hypertension Blood pressures currently maintained 104/58 -126/51. -Resume home blood pressure medications as tolerated  Macrocytic anemia Hemoglobin appears stable at 12.9 with elevated MCV similar to priors. -Continue to monitor  DVT prophylaxis: Lovenox Advance Care Planning:   Code Status: Full Code   Consults: none  Family Communication: Attempted to update wife over the phone, but no answer  Severity of Illness: The appropriate patient status for this patient is OBSERVATION. Observation status is judged to be reasonable and necessary in order to provide the required intensity of service to ensure the patient's safety. The patient's presenting symptoms, physical exam findings, and initial radiographic and laboratory data in the context of their medical condition is felt to place them at decreased risk for further clinical deterioration. Furthermore, it is anticipated that the patient will be medically stable for discharge from the hospital within 2 midnights of admission.   Author: Norval Morton, MD 01/17/2023 7:28 AM  For on call review [email or web address].

## 2023-01-17 NOTE — ED Triage Notes (Signed)
Around 5pm today, pt noticed his mouth including lips and tongue started to swell.  He is drooling.  Able to talk and states he was having trouble breathing earlier but not now.

## 2023-01-17 NOTE — Progress Notes (Signed)
Carryover admission to the Day Admitter.  I discussed this case with the EDP, Dr. Christy Gentles.  Per these discussions:   This is a very high functioning 89 year old male who presents from home complaining of tongue swelling, lip swelling shortness of breath, diarrhea, of acute onset while eating peanuts, cinnamon, and popcorn earlier in the evening.  Not a/w with any stridor.  Is protecting his airway.  He received epinephrine, Benadryl, Pepcid, steroids.  Appears hemodynamically stable.  No rash.  The above symptoms have shown some slight improvement with the above pharmacologic interventions and tincture of time.  However, given the residual degree of angioedema, lip swelling in the context of the patient's age, will bring the patient in for observation to further monitor his recovery.   I have placed an order for observation to med/tele for further evaluation management of suspected presenting anaphylactic reaction.   I have placed some additional preliminary admit orders via the adult multi-morbid admission order set. I have also ordered continuous lactated Ringer's at 75 cc/h x 8 hours for further hemodynamic support.    Babs Bertin, DO Hospitalist

## 2023-01-17 NOTE — ED Provider Triage Note (Signed)
Emergency Medicine Provider Triage Evaluation Note  Adam Tapia , a 89 y.o. male  was evaluated in triage.  Pt complains of swelling of the tongue and lower lip with associated nausea, diarrhea, dizziness and shortness of breath after eating a hard candy this evening.  History of similar reaction with similar candy in the past.  Review of Systems  Positive:  Negative:   Unable to complete due to Acuity of condition Physical Exam  BP (!) 114/59 (BP Location: Right Arm)   Pulse (!) 101   Temp 100.1 F (37.8 C)   Resp 16   SpO2 93%  Gen:   Awake, difficult to understand speech secondary to oral swelling Resp:  Normal effort lungs CTA MSK:   Moves extremities without difficulty  Other:  Very mild tachycardia with regular rhythm.  Lingual edema and lower lip edema and erythema.  Medical Decision Making  Medically screening exam initiated at 12:40 AM.  Appropriate orders placed.  Adam Tapia was informed that the remainder of the evaluation will be completed by another provider, this initial triage assessment does not replace that evaluation, and the importance of remaining in the ED until their evaluation is complete.  Concern for anaphylactic reaction versus angioedema, order set initiated, patient transported emergently to trauma A.  Agricultural consultant and EDP aware. This chart was dictated using voice recognition software, Dragon. Despite the best efforts of this provider to proofread and correct errors, errors may still occur which can change documentation meaning.     Emeline Darling, PA-C 01/17/23 [phone]

## 2023-02-02 ENCOUNTER — Other Ambulatory Visit: Payer: Self-pay | Admitting: Internal Medicine

## 2023-02-03 LAB — BASIC METABOLIC PANEL WITH GFR
BUN/Creatinine Ratio: 22 (calc) (ref 6–22)
BUN: 46 mg/dL — ABNORMAL HIGH (ref 7–25)
CO2: 23 mmol/L (ref 20–32)
Calcium: 9.4 mg/dL (ref 8.6–10.3)
Chloride: 109 mmol/L (ref 98–110)
Creat: 2.06 mg/dL — ABNORMAL HIGH (ref 0.70–1.22)
Glucose, Bld: 91 mg/dL (ref 65–99)
Potassium: 4.2 mmol/L (ref 3.5–5.3)
Sodium: 139 mmol/L (ref 135–146)
eGFR: 29 mL/min/{1.73_m2} — ABNORMAL LOW (ref >=60)

## 2023-02-03 LAB — EXTRA LAV TOP TUBE

## 2023-03-06 ENCOUNTER — Other Ambulatory Visit: Payer: Self-pay | Admitting: Internal Medicine

## 2023-03-06 DIAGNOSIS — D471 Chronic myeloproliferative disease: Secondary | ICD-10-CM

## 2023-03-27 ENCOUNTER — Inpatient Hospital Stay: Payer: Medicare Other | Attending: Internal Medicine

## 2023-03-27 ENCOUNTER — Inpatient Hospital Stay (HOSPITAL_BASED_OUTPATIENT_CLINIC_OR_DEPARTMENT_OTHER): Payer: Medicare Other | Admitting: Internal Medicine

## 2023-03-27 ENCOUNTER — Other Ambulatory Visit: Payer: Self-pay

## 2023-03-27 VITALS — BP 150/72 | HR 62 | Temp 98.0°F | Resp 16 | Wt 186.3 lb

## 2023-03-27 DIAGNOSIS — D75839 Thrombocytosis, unspecified: Secondary | ICD-10-CM | POA: Insufficient documentation

## 2023-03-27 DIAGNOSIS — R2231 Localized swelling, mass and lump, right upper limb: Secondary | ICD-10-CM | POA: Diagnosis not present

## 2023-03-27 DIAGNOSIS — Z79899 Other long term (current) drug therapy: Secondary | ICD-10-CM | POA: Insufficient documentation

## 2023-03-27 DIAGNOSIS — D72829 Elevated white blood cell count, unspecified: Secondary | ICD-10-CM | POA: Diagnosis not present

## 2023-03-27 DIAGNOSIS — D471 Chronic myeloproliferative disease: Secondary | ICD-10-CM

## 2023-03-27 LAB — CBC WITH DIFFERENTIAL (CANCER CENTER ONLY)
Abs Immature Granulocytes: 0.12 10*3/uL — ABNORMAL HIGH (ref 0.00–0.07)
Basophils Absolute: 0.1 10*3/uL (ref 0.0–0.1)
Basophils Relative: 0 %
Eosinophils Absolute: 0.1 10*3/uL (ref 0.0–0.5)
Eosinophils Relative: 0 %
HCT: 35.9 % — ABNORMAL LOW (ref 39.0–52.0)
Hemoglobin: 12.7 g/dL — ABNORMAL LOW (ref 13.0–17.0)
Immature Granulocytes: 1 %
Lymphocytes Relative: 18 %
Lymphs Abs: 3 10*3/uL (ref 0.7–4.0)
MCH: 43.9 pg — ABNORMAL HIGH (ref 26.0–34.0)
MCHC: 35.4 g/dL (ref 30.0–36.0)
MCV: 124.2 fL — ABNORMAL HIGH (ref 80.0–100.0)
Monocytes Absolute: 1.3 10*3/uL — ABNORMAL HIGH (ref 0.1–1.0)
Monocytes Relative: 8 %
Neutro Abs: 12.1 10*3/uL — ABNORMAL HIGH (ref 1.7–7.7)
Neutrophils Relative %: 73 %
Platelet Count: 286 10*3/uL (ref 150–400)
RBC: 2.89 MIL/uL — ABNORMAL LOW (ref 4.22–5.81)
RDW: 12.2 % (ref 11.5–15.5)
WBC Count: 16.7 10*3/uL — ABNORMAL HIGH (ref 4.0–10.5)
nRBC: 0 % (ref 0.0–0.2)

## 2023-03-27 LAB — CMP (CANCER CENTER ONLY)
ALT: 8 U/L (ref 0–44)
AST: 16 U/L (ref 15–41)
Albumin: 3.8 g/dL (ref 3.5–5.0)
Alkaline Phosphatase: 67 U/L (ref 38–126)
Anion gap: 7 (ref 5–15)
BUN: 37 mg/dL — ABNORMAL HIGH (ref 8–23)
CO2: 22 mmol/L (ref 22–32)
Calcium: 9.1 mg/dL (ref 8.9–10.3)
Chloride: 108 mmol/L (ref 98–111)
Creatinine: 1.76 mg/dL — ABNORMAL HIGH (ref 0.61–1.24)
GFR, Estimated: 35 mL/min — ABNORMAL LOW (ref >60)
Glucose, Bld: 87 mg/dL (ref 70–99)
Potassium: 4.6 mmol/L (ref 3.5–5.1)
Sodium: 137 mmol/L (ref 135–145)
Total Bilirubin: 0.4 mg/dL (ref 0.3–1.2)
Total Protein: 7.3 g/dL (ref 6.5–8.1)

## 2023-03-27 LAB — LACTATE DEHYDROGENASE: LDH: 159 U/L (ref 98–192)

## 2023-03-27 NOTE — Progress Notes (Signed)
[facility] [phone]   [phone]  OFFICE PROGRESS NOTE  Catha Gosselin, MD [address] [place] [address]  DIAGNOSIS: Myeloproliferative disorder with positive JAK-2 mutation.  PRIOR THERAPY: None.  CURRENT THERAPY: Hydrea 500 mg by mouth daily. Status post approximately 38 months months of treatment.  The dose of Hydrea was increased to 500 mg twice a day on 04/03/2018.   INTERVAL HISTORY: DEMONT DOCHERTY 89 y.o. male returns to the clinic today for follow-up visit accompanied by his wife.  The patient is feeling fine today with no concerning complaints except for feeling a mass right arm.  He has been tolerating his treatment with hydroxyurea fairly well.  He denied having any current chest pain, shortness of breath, cough or hemoptysis.  He is here today for evaluation and repeat blood work.  ALLERGIES:  is allergic to nsaids.  MEDICATIONS:  Current Outpatient Medications  Medication Sig Dispense Refill   acetaminophen (TYLENOL) 325 MG tablet Take 650 mg by mouth daily as needed for moderate pain.     allopurinol (ZYLOPRIM) 100 MG tablet TAKE 1 TABLET BY MOUTH EVERY DAY (Patient taking differently: Take 100 mg by mouth daily as needed (gout).) 90 tablet 1   amLODipine (NORVASC) 5 MG tablet Take 5 mg by mouth daily.     atenolol (TENORMIN) 25 MG tablet Take 25 mg by mouth daily.  5   candesartan (ATACAND) 8 MG tablet Take 8 mg by mouth daily.     fexofenadine (ALLEGRA) 180 MG tablet Take 180 mg by mouth daily.     hydroxyurea (HYDREA) 500 MG capsule TAKE 1 CAPSULE BY MOUTH TWICE A DAY (MAY TAKE WITH FOOD TO MINIMIZE SIDE EFFECTS) 60 capsule 2   ketotifen (ZADITOR) 0.025 % ophthalmic solution Place 1 drop into both eyes daily as needed (itching eyes).     promethazine-dextromethorphan (PROMETHAZINE-DM) 6.25-15 MG/5ML syrup Take 2.5-5 mLs by mouth 4 (four) times daily as needed for cough. (Patient not taking: Reported on 01/17/2023)  118 mL 0   TURMERIC PO Take 1 tablet by mouth daily. (Patient not taking: Reported on 01/17/2023)     No current facility-administered medications for this visit.    REVIEW OF SYSTEMS:  A comprehensive review of systems was negative except for: Constitutional: positive for fatigue   PHYSICAL EXAMINATION: General appearance: alert, cooperative, and no distress Head: Normocephalic, without obvious abnormality, atraumatic Neck: no adenopathy Lymph nodes: Cervical, supraclavicular, and axillary nodes normal. Resp: clear to auscultation bilaterally Back: symmetric, no curvature. ROM normal. No CVA tenderness. Cardio: regular rate and rhythm, S1, S2 normal, no murmur, click, rub or gallop GI: soft, non-tender; bowel sounds normal; no masses,  no organomegaly Extremities: extremities normal, atraumatic, no cyanosis or edema  ECOG PERFORMANCE STATUS: 1 - Symptomatic but completely ambulatory  Blood pressure (!) 150/72, pulse 62, temperature 98 F (36.7 C), temperature source Oral, resp. rate 16, weight 186 lb 4.8 oz (84.5 kg), SpO2 100 %.  LABORATORY DATA: Lab Results  Component Value Date   WBC 16.7 (H) 03/27/2023   HGB 12.7 (L) 03/27/2023   HCT 35.9 (L) 03/27/2023   MCV 124.2 (H) 03/27/2023   PLT 286 03/27/2023      Chemistry      Component Value Date/Time   NA 139 02/02/2023 1146   NA 138 10/05/2017 1018   K 4.2 02/02/2023 1146   K 4.4 10/05/2017 1018   CL 109 02/02/2023 1146   CO2 23 02/02/2023  1146   CO2 25 10/05/2017 1018   BUN 46 (H) 02/02/2023 1146   BUN 21.8 10/05/2017 1018   CREATININE 2.06 (H) 02/02/2023 1146   CREATININE 1.3 10/05/2017 1018   GLU 88 03/17/2016 0000      Component Value Date/Time   CALCIUM 9.4 02/02/2023 1146   CALCIUM 9.9 10/05/2017 1018   ALKPHOS 65 12/26/2022 1313   ALKPHOS 154 (H) 10/05/2017 1018   AST 13 (L) 12/26/2022 1313   AST 18 10/05/2017 1018   ALT 8 12/26/2022 1313   ALT 13 10/05/2017 1018   BILITOT 0.4 12/26/2022 1313    BILITOT 0.45 10/05/2017 1018       RADIOGRAPHIC STUDIES: No results found.  ASSESSMENT AND PLAN:  This is a very pleasant 89 years old African-American male with myeloproliferative disorder and persistent leukocytosis and thrombocytosis with positive JAK 2 mutation. The patient is currently on treatment with hydroxyurea 500 mg p.o. daily status post 38 months. His treatment was changed to 500 mg p.o. twice daily in May 2019. The patient has been tolerating this treatment well with no concerning adverse effects.  Repeat CBC today showed decrease in the total white blood count down to 16.7 with hemoglobin of 12.7 and hematocrit 35.9% with normal platelets count of 286,000. I recommended for the patient to continue his current treatment with the same dose. For the suspicious muscle mass and the right arm, I will order CT scan of the right arm to rule out any underlying neoplasm. I will see him back for follow-up visit in 3 months for evaluation and repeat blood work unless the CT scan showed concerning findings, I will see him sooner. The patient was advised to call immediately if he has any concerning symptoms in the interval.  The patient voices understanding of current disease status and treatment options and is in agreement with the current care plan. All questions were answered. The patient knows to call the clinic with any problems, questions or concerns. We can certainly see the patient much sooner if necessary.  Disclaimer: This note was dictated with voice recognition software. Similar sounding words can inadvertently be transcribed and may not be corrected upon review.

## 2023-04-14 ENCOUNTER — Ambulatory Visit (HOSPITAL_COMMUNITY)
Admission: RE | Admit: 2023-04-14 | Discharge: 2023-04-14 | Disposition: A | Discharge status: Home / Self Care | Payer: Medicare Other | Source: Ambulatory Visit | Attending: Internal Medicine | Admitting: Internal Medicine

## 2023-04-14 DIAGNOSIS — D471 Chronic myeloproliferative disease: Secondary | ICD-10-CM | POA: Diagnosis present

## 2023-04-14 MED ORDER — IOHEXOL 300 MG/ML  SOLN
75.0000 mL | Freq: Once | INTRAMUSCULAR | Status: AC | PRN
  Administered 2023-04-14: 75 mL via INTRAVENOUS

## 2023-04-27 ENCOUNTER — Telehealth: Payer: Self-pay | Admitting: Medical Oncology

## 2023-04-27 NOTE — Telephone Encounter (Signed)
-----   Message from Si Gaul, MD sent at 04/27/2023  1:01 PM EDT ----- He may need to see an orthopedic surgery for questionable rupture of his biceps.  He will need to get the referral from his primary care physician.  Thank you ----- Message ----- From: Interface, Rad Results In Sent: 04/27/2023  12:50 PM EDT To: Si Gaul, MD

## 2023-04-27 NOTE — Telephone Encounter (Signed)
I LVM with Dr. Asa Lente message. Faxed CT report to PCP.

## 2023-04-27 NOTE — Telephone Encounter (Signed)
Pt called back and I gave him Dr. Asa Lente instructions.

## 2023-05-22 ENCOUNTER — Other Ambulatory Visit: Payer: Self-pay | Admitting: Internal Medicine

## 2023-05-22 DIAGNOSIS — D471 Chronic myeloproliferative disease: Secondary | ICD-10-CM

## 2023-06-20 ENCOUNTER — Emergency Department (HOSPITAL_COMMUNITY): Payer: Medicare Other

## 2023-06-20 ENCOUNTER — Inpatient Hospital Stay (HOSPITAL_COMMUNITY)
Admission: EM | Admit: 2023-06-20 | Discharge: 2023-06-26 | DRG: 643 | Disposition: A | Discharge status: Home Health | Payer: Medicare Other | Attending: Internal Medicine | Admitting: Internal Medicine

## 2023-06-20 ENCOUNTER — Encounter (HOSPITAL_COMMUNITY): Payer: Self-pay

## 2023-06-20 ENCOUNTER — Other Ambulatory Visit: Payer: Self-pay

## 2023-06-20 DIAGNOSIS — Z886 Allergy status to analgesic agent status: Secondary | ICD-10-CM | POA: Diagnosis not present

## 2023-06-20 DIAGNOSIS — E222 Syndrome of inappropriate secretion of antidiuretic hormone: Secondary | ICD-10-CM | POA: Diagnosis present

## 2023-06-20 DIAGNOSIS — S0031XA Abrasion of nose, initial encounter: Secondary | ICD-10-CM | POA: Diagnosis present

## 2023-06-20 DIAGNOSIS — E861 Hypovolemia: Secondary | ICD-10-CM | POA: Diagnosis present

## 2023-06-20 DIAGNOSIS — W19XXXA Unspecified fall, initial encounter: Secondary | ICD-10-CM | POA: Diagnosis not present

## 2023-06-20 DIAGNOSIS — K5900 Constipation, unspecified: Secondary | ICD-10-CM | POA: Diagnosis present

## 2023-06-20 DIAGNOSIS — Z1152 Encounter for screening for COVID-19: Secondary | ICD-10-CM

## 2023-06-20 DIAGNOSIS — I129 Hypertensive chronic kidney disease with stage 1 through stage 4 chronic kidney disease, or unspecified chronic kidney disease: Secondary | ICD-10-CM | POA: Diagnosis present

## 2023-06-20 DIAGNOSIS — L409 Psoriasis, unspecified: Secondary | ICD-10-CM | POA: Diagnosis present

## 2023-06-20 DIAGNOSIS — E876 Hypokalemia: Secondary | ICD-10-CM | POA: Diagnosis present

## 2023-06-20 DIAGNOSIS — E872 Acidosis, unspecified: Secondary | ICD-10-CM | POA: Diagnosis present

## 2023-06-20 DIAGNOSIS — D509 Iron deficiency anemia, unspecified: Secondary | ICD-10-CM | POA: Diagnosis present

## 2023-06-20 DIAGNOSIS — E663 Overweight: Secondary | ICD-10-CM | POA: Diagnosis present

## 2023-06-20 DIAGNOSIS — I10 Essential (primary) hypertension: Secondary | ICD-10-CM | POA: Diagnosis present

## 2023-06-20 DIAGNOSIS — E871 Hypo-osmolality and hyponatremia: Secondary | ICD-10-CM | POA: Diagnosis not present

## 2023-06-20 DIAGNOSIS — Z7901 Long term (current) use of anticoagulants: Secondary | ICD-10-CM

## 2023-06-20 DIAGNOSIS — Y929 Unspecified place or not applicable: Secondary | ICD-10-CM | POA: Diagnosis not present

## 2023-06-20 DIAGNOSIS — Z96642 Presence of left artificial hip joint: Secondary | ICD-10-CM | POA: Diagnosis present

## 2023-06-20 DIAGNOSIS — Z79899 Other long term (current) drug therapy: Secondary | ICD-10-CM

## 2023-06-20 DIAGNOSIS — N400 Enlarged prostate without lower urinary tract symptoms: Secondary | ICD-10-CM | POA: Diagnosis present

## 2023-06-20 DIAGNOSIS — J189 Pneumonia, unspecified organism: Secondary | ICD-10-CM | POA: Diagnosis not present

## 2023-06-20 DIAGNOSIS — Z6828 Body mass index (BMI) 28.0-28.9, adult: Secondary | ICD-10-CM

## 2023-06-20 DIAGNOSIS — N1832 Chronic kidney disease, stage 3b: Secondary | ICD-10-CM | POA: Diagnosis present

## 2023-06-20 DIAGNOSIS — Y92009 Unspecified place in unspecified non-institutional (private) residence as the place of occurrence of the external cause: Secondary | ICD-10-CM

## 2023-06-20 DIAGNOSIS — I4891 Unspecified atrial fibrillation: Secondary | ICD-10-CM | POA: Diagnosis present

## 2023-06-20 DIAGNOSIS — R296 Repeated falls: Secondary | ICD-10-CM | POA: Diagnosis present

## 2023-06-20 DIAGNOSIS — D471 Chronic myeloproliferative disease: Secondary | ICD-10-CM | POA: Diagnosis present

## 2023-06-20 DIAGNOSIS — K219 Gastro-esophageal reflux disease without esophagitis: Secondary | ICD-10-CM | POA: Diagnosis present

## 2023-06-20 DIAGNOSIS — R918 Other nonspecific abnormal finding of lung field: Secondary | ICD-10-CM | POA: Diagnosis present

## 2023-06-20 LAB — SARS CORONAVIRUS 2 BY RT PCR: SARS Coronavirus 2 by RT PCR: NEGATIVE

## 2023-06-20 LAB — URINALYSIS, ROUTINE W REFLEX MICROSCOPIC
Bilirubin Urine: NEGATIVE
Glucose, UA: NEGATIVE mg/dL
Ketones, ur: 5 mg/dL — AB
Leukocytes,Ua: NEGATIVE
Nitrite: NEGATIVE
Protein, ur: 100 mg/dL — AB
Specific Gravity, Urine: 1.02 (ref 1.005–1.030)
pH: 5 (ref 5.0–8.0)

## 2023-06-20 LAB — CBC WITH DIFFERENTIAL/PLATELET
Abs Immature Granulocytes: 0.33 10*3/uL — ABNORMAL HIGH (ref 0.00–0.07)
Basophils Absolute: 0 10*3/uL (ref 0.0–0.1)
Basophils Relative: 0 %
Eosinophils Absolute: 0 10*3/uL (ref 0.0–0.5)
Eosinophils Relative: 0 %
HCT: 28.6 % — ABNORMAL LOW (ref 39.0–52.0)
Hemoglobin: 10.1 g/dL — ABNORMAL LOW (ref 13.0–17.0)
Immature Granulocytes: 5 %
Lymphocytes Relative: 12 %
Lymphs Abs: 0.8 10*3/uL (ref 0.7–4.0)
MCH: 43.5 pg — ABNORMAL HIGH (ref 26.0–34.0)
MCHC: 35.3 g/dL (ref 30.0–36.0)
MCV: 123.3 fL — ABNORMAL HIGH (ref 80.0–100.0)
Monocytes Absolute: 0.6 10*3/uL (ref 0.1–1.0)
Monocytes Relative: 10 %
Neutro Abs: 4.7 10*3/uL (ref 1.7–7.7)
Neutrophils Relative %: 73 %
Platelets: 154 10*3/uL (ref 150–400)
RBC: 2.32 MIL/uL — ABNORMAL LOW (ref 4.22–5.81)
RDW: 15.3 % (ref 11.5–15.5)
WBC: 6.4 10*3/uL (ref 4.0–10.5)
nRBC: 0 % (ref 0.0–0.2)

## 2023-06-20 LAB — BASIC METABOLIC PANEL
Anion gap: 10 (ref 5–15)
BUN: 19 mg/dL (ref 8–23)
CO2: 21 mmol/L — ABNORMAL LOW (ref 22–32)
Calcium: 8.6 mg/dL — ABNORMAL LOW (ref 8.9–10.3)
Chloride: 95 mmol/L — ABNORMAL LOW (ref 98–111)
Creatinine, Ser: 1.3 mg/dL — ABNORMAL HIGH (ref 0.61–1.24)
GFR, Estimated: 50 mL/min — ABNORMAL LOW (ref >60)
Glucose, Bld: 147 mg/dL — ABNORMAL HIGH (ref 70–99)
Potassium: 3.5 mmol/L (ref 3.5–5.1)
Sodium: 126 mmol/L — ABNORMAL LOW (ref 135–145)

## 2023-06-20 LAB — COMPREHENSIVE METABOLIC PANEL
ALT: 13 U/L (ref 0–44)
AST: 24 U/L (ref 15–41)
Albumin: 2.7 g/dL — ABNORMAL LOW (ref 3.5–5.0)
Alkaline Phosphatase: 33 U/L — ABNORMAL LOW (ref 38–126)
Anion gap: 11 (ref 5–15)
BUN: 22 mg/dL (ref 8–23)
CO2: 18 mmol/L — ABNORMAL LOW (ref 22–32)
Calcium: 8.2 mg/dL — ABNORMAL LOW (ref 8.9–10.3)
Chloride: 98 mmol/L (ref 98–111)
Creatinine, Ser: 1.63 mg/dL — ABNORMAL HIGH (ref 0.61–1.24)
GFR, Estimated: 38 mL/min — ABNORMAL LOW (ref >60)
Glucose, Bld: 122 mg/dL — ABNORMAL HIGH (ref 70–99)
Potassium: 3.6 mmol/L (ref 3.5–5.1)
Sodium: 127 mmol/L — ABNORMAL LOW (ref 135–145)
Total Bilirubin: 0.6 mg/dL (ref 0.3–1.2)
Total Protein: 6.9 g/dL (ref 6.5–8.1)

## 2023-06-20 LAB — RESP PANEL BY RT-PCR (RSV, FLU A&B, COVID)  RVPGX2
Influenza A by PCR: NEGATIVE
Influenza B by PCR: NEGATIVE
Resp Syncytial Virus by PCR: NEGATIVE
SARS Coronavirus 2 by RT PCR: NEGATIVE

## 2023-06-20 LAB — PROTIME-INR
INR: 1.2 (ref 0.8–1.2)
Prothrombin Time: 15.4 seconds — ABNORMAL HIGH (ref 11.4–15.2)

## 2023-06-20 LAB — CULTURE, BLOOD (ROUTINE X 2): Special Requests: ADEQUATE

## 2023-06-20 LAB — I-STAT CG4 LACTIC ACID, ED: Lactic Acid, Venous: 2.5 mmol/L (ref 0.5–1.9)

## 2023-06-20 LAB — MAGNESIUM: Magnesium: 1.7 mg/dL (ref 1.7–2.4)

## 2023-06-20 LAB — BRAIN NATRIURETIC PEPTIDE: B Natriuretic Peptide: 169.1 pg/mL — ABNORMAL HIGH (ref 0.0–100.0)

## 2023-06-20 LAB — APTT: aPTT: 28 seconds (ref 24–36)

## 2023-06-20 LAB — LACTIC ACID, PLASMA: Lactic Acid, Venous: 1.9 mmol/L (ref 0.5–1.9)

## 2023-06-20 MED ORDER — ACETAMINOPHEN 325 MG PO TABS
650.0000 mg | ORAL_TABLET | Freq: Four times a day (QID) | ORAL | Status: DC | PRN
  Administered 2023-06-20: 650 mg via ORAL
  Filled 2023-06-20 (×2): qty 2

## 2023-06-20 MED ORDER — ACETAMINOPHEN 325 MG PO TABS: 650.0000 mg | ORAL_TABLET | Freq: Every day | ORAL | Status: DC | PRN

## 2023-06-20 MED ORDER — PANTOPRAZOLE SODIUM 40 MG PO TBEC
40.0000 mg | DELAYED_RELEASE_TABLET | Freq: Every day | ORAL | Status: DC
  Administered 2023-06-21 – 2023-06-26 (×6): 40 mg via ORAL
  Filled 2023-06-20 (×6): qty 1

## 2023-06-20 MED ORDER — SODIUM CHLORIDE 0.9 % IV SOLN
2.0000 g | INTRAVENOUS | Status: AC
  Administered 2023-06-21 – 2023-06-24 (×4): 2 g via INTRAVENOUS
  Filled 2023-06-20 (×4): qty 20

## 2023-06-20 MED ORDER — SODIUM CHLORIDE 0.9 % IV SOLN: INTRAVENOUS | Status: DC

## 2023-06-20 MED ORDER — SODIUM CHLORIDE 0.9 % IV SOLN
500.0000 mg | Freq: Once | INTRAVENOUS | Status: AC
  Administered 2023-06-20: 500 mg via INTRAVENOUS
  Filled 2023-06-20: qty 5

## 2023-06-20 MED ORDER — AMLODIPINE BESYLATE 5 MG PO TABS
5.0000 mg | ORAL_TABLET | Freq: Every day | ORAL | Status: DC
  Administered 2023-06-21 – 2023-06-26 (×6): 5 mg via ORAL
  Filled 2023-06-20 (×6): qty 1

## 2023-06-20 MED ORDER — ONDANSETRON HCL 4 MG PO TABS: 4.0000 mg | ORAL_TABLET | Freq: Four times a day (QID) | ORAL | Status: DC | PRN

## 2023-06-20 MED ORDER — LACTATED RINGERS IV BOLUS
1000.0000 mL | Freq: Once | INTRAVENOUS | Status: AC
  Administered 2023-06-20: 1000 mL via INTRAVENOUS

## 2023-06-20 MED ORDER — ATENOLOL 25 MG PO TABS
25.0000 mg | ORAL_TABLET | Freq: Every day | ORAL | Status: DC
  Administered 2023-06-21 – 2023-06-26 (×6): 25 mg via ORAL
  Filled 2023-06-20 (×6): qty 1

## 2023-06-20 MED ORDER — SODIUM CHLORIDE 0.9 % IV SOLN
500.0000 mg | INTRAVENOUS | Status: AC
  Administered 2023-06-21 – 2023-06-24 (×4): 500 mg via INTRAVENOUS
  Filled 2023-06-20 (×4): qty 5

## 2023-06-20 MED ORDER — SODIUM CHLORIDE 0.9 % IV SOLN
1.0000 g | Freq: Once | INTRAVENOUS | Status: AC
  Administered 2023-06-20: 1 g via INTRAVENOUS
  Filled 2023-06-20: qty 10

## 2023-06-20 MED ORDER — POLYETHYLENE GLYCOL 3350 17 G PO PACK
17.0000 g | PACK | Freq: Every day | ORAL | Status: DC
  Administered 2023-06-21 – 2023-06-26 (×6): 17 g via ORAL
  Filled 2023-06-20 (×6): qty 1

## 2023-06-20 MED ORDER — LACTATED RINGERS IV SOLN: INTRAVENOUS | Status: DC

## 2023-06-20 MED ORDER — ACETAMINOPHEN 650 MG RE SUPP: 650.0000 mg | Freq: Four times a day (QID) | RECTAL | Status: DC | PRN

## 2023-06-20 MED ORDER — HYDROXYUREA 500 MG PO CAPS: 500.0000 mg | ORAL_CAPSULE | Freq: Two times a day (BID) | ORAL | Status: DC

## 2023-06-20 MED ORDER — ALBUTEROL SULFATE (2.5 MG/3ML) 0.083% IN NEBU: 2.5000 mg | INHALATION_SOLUTION | RESPIRATORY_TRACT | Status: DC | PRN

## 2023-06-20 MED ORDER — ACETAMINOPHEN 325 MG PO TABS: 650.0000 mg | ORAL_TABLET | Freq: Once | ORAL | Status: DC

## 2023-06-20 MED ORDER — ONDANSETRON HCL 4 MG/2ML IJ SOLN: 4.0000 mg | Freq: Four times a day (QID) | INTRAMUSCULAR | Status: DC | PRN

## 2023-06-20 NOTE — ED Notes (Signed)
Pt to ct via stretcher

## 2023-06-20 NOTE — Progress Notes (Signed)
Notified bedside nurse of need to draw repeat lactic acid. 

## 2023-06-20 NOTE — H&P (Addendum)
History and Physical    MARTREZ MAHAFFY JXB:[id number] DOB: 1923-06-30 DOA: 06/20/2023  PCP: Fleet Contras, MD  Patient coming from: home  I have personally briefly reviewed patient's old medical records in [facility]  Chief Complaint: fall   HPI: Adam Tapia is a 89 y.o. male with medical history significant of  Atrial fibrillation, Hypertension,Myeloproliferative disorder, GERD, constipation ,unsteady gait ,psoriasis,CKDIIIb-IV who presents to ED BIBEMS  with complaint of multiple falls over the last 2 days. Patient has per family been weaker over the last few days with recurrent falls.  Patient per family had fall yesterday that was mechanical with  head strike resulting in abrasion on left side of his nose. Of note family states patient has had increase stools x 1 month and is currently undergoing evaluation by pcp. In the field patient was noted to have temp of 101.7 for which he was given 1 gram of tylenol enroute. Patient currently states she has been feeling weaker x 2 days. He notes his diarrhea has improved. He  notes no cough / fever/ chills/ chest  pain or sob but noted low appetite and generalized weakness.   ED Course:  IN ED Vitals:99.8, bp 129/62, hr 87, sat 100% on ra  Wbc: 6.4, hgb 10.1 ( 12.7) ZHY:QMVHQIONGE: 1. No acute intracranial abnormality. 2. No acute fracture or traumatic subluxation of the cervical spine.  resp panel: neg  Cxr: IMPRESSION: Mild opacity at the right mid and lower lung. Acute infiltrates possible. Recommend follow-up to confirm clearance.  XB:MWUXLKG Na  127(137), k 3.6, CL 98, CO2 18, glu 122, cr 1.63 (at baseline)  Tx LR  Review of Systems: As per HPI otherwise 10 point review of systems negative.   Past Medical History:  Diagnosis Date   Acute blood loss anemia    AF (atrial fibrillation) (HCC)    Allergic rhinitis    Anaphylactic reaction 01/17/2023   Anemia    Arthritis    BPH (benign prostatic hyperplasia)     Constipation    Dysrhythmia    PT STATES HAS HX A FIB - FOLLOWED BY DR.KEVIN LITTLE   GERD (gastroesophageal reflux disease)    OCCASIONAL - HAS NEXIUM IF NEEDED - NOT CURRENTLY TAKING   Hypertension    Myeloproliferative disorder (HCC)    [facility] DR[facility] Arbutus Ped  [facility]   Nocturia    Primary osteoarthritis of left hip    Psoriasis    Unsteady gait     Past Surgical History:  Procedure Laterality Date   HERNIA REPAIR  1998   TOTAL HIP ARTHROPLASTY Left 03/11/2016   Procedure: LEFT TOTAL HIP ARTHROPLASTY ANTERIOR APPROACH;  Surgeon: Kathryne Hitch, MD;  Location: [facility];  Service: Orthopedics;  Laterality: Left;  Went from Spinal to an LMA     reports that he has never smoked. He has never used smokeless tobacco. He reports that he does not drink alcohol and does not use drugs.  Allergies  Allergen Reactions   Nsaids Other (See Comments)    History reviewed. No pertinent family history.  Prior to Admission medications   Medication Sig Start Date End Date Taking? Authorizing Provider  acetaminophen (TYLENOL) 325 MG tablet Take 650 mg by mouth daily as needed for moderate pain.    [provider]  allopurinol (ZYLOPRIM) 100 MG tablet TAKE 1 TABLET BY MOUTH EVERY DAY Patient taking differently: Take 100 mg by mouth daily as needed (gout). 02/10/21   Si Gaul, MD  amLODipine (NORVASC) 5 MG tablet Take 5 mg by mouth daily.    [provider]  atenolol (TENORMIN) 25 MG tablet Take 25 mg by mouth daily. 07/18/15   [provider]  candesartan (ATACAND) 8 MG tablet Take 8 mg by mouth daily. 06/26/19   [provider]  fexofenadine (ALLEGRA) 180 MG tablet Take 180 mg by mouth daily.    [provider]  hydroxyurea (HYDREA) 500 MG capsule TAKE 1 CAPSULE BY MOUTH TWICE A DAY (MAY TAKE WITH FOOD TO MINIMIZE SIDE EFFECTS) 05/22/23   Si Gaul, MD  ketotifen (ZADITOR) 0.025 % ophthalmic solution Place 1 drop into both  eyes daily as needed (itching eyes).    [provider]  promethazine-dextromethorphan (PROMETHAZINE-DM) 6.25-15 MG/5ML syrup Take 2.5-5 mLs by mouth 4 (four) times daily as needed for cough. Patient not taking: Reported on 01/17/2023 05/23/21   Tommie Sams, DO  TURMERIC PO Take 1 tablet by mouth daily. Patient not taking: Reported on 01/17/2023    [provider]    Physical Exam: Vitals:   06/20/23 1400 06/20/23 1522 06/20/23 1524 06/20/23 1600  BP: (!) 100/58  (!) 141/70 (!) 145/59  Pulse: 88  75 79  Resp: (!) 28 (!) 24 18 (!) 22  Temp:   98.7 F (37.1 C)   TempSrc:   Oral   SpO2: 100%  98% 98%  Weight:      Height:        Constitutional: NAD, calm, comfortable,pleasant Vitals:   06/20/23 1400 06/20/23 1522 06/20/23 1524 06/20/23 1600  BP: (!) 100/58  (!) 141/70 (!) 145/59  Pulse: 88  75 79  Resp: (!) 28 (!) 24 18 (!) 22  Temp:   98.7 F (37.1 C)   TempSrc:   Oral   SpO2: 100%  98% 98%  Weight:      Height:       Eyes: eomi, lids and conjunctivae normal ENMT: Mucous membranes are moist. Posterior pharynx clear of any exudate or lesions.Normal dentition.  Neck: normal, supple, no masses, no thyromegaly Respiratory: clear to auscultation bilaterally, no wheezing, no crackles. Normal respiratory effort. No accessory muscle use.  Cardiovascular: Regular rate and rhythm, no murmurs / rubs / gallops. Trace extremity edema. 2+ pedal pulses.  Abdomen: no tenderness, no masses palpated. No hepatosplenomegaly. Bowel sounds positive.  Musculoskeletal: no clubbing / cyanosis. No joint deformity upper and lower extremities. Good ROM, no contractures. Normal muscle tone.  Skin: no rashes, lesions, ulcers. No induration Neurologic: CN 2-12 grossly intact. Sensation intact, . Strength 5/5 in all 4.  Psychiatric: Normal judgment and insight. Alert and oriented x 3. Normal mood.    Labs on Admission: I have personally reviewed following labs and imaging  studies  CBC: Recent Labs  Lab 06/20/23 1410  WBC 6.4  NEUTROABS PENDING  HGB 10.1*  HCT 28.6*  MCV 123.3*  PLT 154   Basic Metabolic Panel: Recent Labs  Lab 06/20/23 1410  NA 127*  K 3.6  CL 98  CO2 18*  GLUCOSE 122*  BUN 22  CREATININE 1.63*  CALCIUM 8.2*   GFR: Estimated Creatinine Clearance: 24.5 mL/min (A) (by C-G formula based on SCr of 1.63 mg/dL (H)). Liver Function Tests: Recent Labs  Lab 06/20/23 1410  AST 24  ALT 13  ALKPHOS 33*  BILITOT 0.6  PROT 6.9  ALBUMIN 2.7*   No results for input(s): "LIPASE", "AMYLASE" in the last 168 hours. No results for input(s): "AMMONIA" in the last 168  hours. Coagulation Profile: No results for input(s): "INR", "PROTIME" in the last 168 hours. Cardiac Enzymes: No results for input(s): "CKTOTAL", "CKMB", "CKMBINDEX", "TROPONINI" in the last 168 hours. BNP (last 3 results) No results for input(s): "PROBNP" in the last 8760 hours. HbA1C: No results for input(s): "HGBA1C" in the last 72 hours. CBG: No results for input(s): "GLUCAP" in the last 168 hours. Lipid Profile: No results for input(s): "CHOL", "HDL", "LDLCALC", "TRIG", "CHOLHDL", "LDLDIRECT" in the last 72 hours. Thyroid Function Tests: No results for input(s): "TSH", "T4TOTAL", "FREET4", "T3FREE", "THYROIDAB" in the last 72 hours. Anemia Panel: No results for input(s): "VITAMINB12", "FOLATE", "FERRITIN", "TIBC", "IRON", "RETICCTPCT" in the last 72 hours. Urine analysis:    Component Value Date/Time   COLORURINE YELLOW 03/14/2016 0959   APPEARANCEUR CLEAR 03/14/2016 0959   LABSPEC 1.013 03/14/2016 0959   PHURINE 6.5 03/14/2016 0959   GLUCOSEU NEGATIVE 03/14/2016 0959   HGBUR TRACE (A) 03/14/2016 0959   BILIRUBINUR NEGATIVE 03/14/2016 0959   KETONESUR NEGATIVE 03/14/2016 0959   PROTEINUR NEGATIVE 03/14/2016 0959   NITRITE NEGATIVE 03/14/2016 0959   LEUKOCYTESUR NEGATIVE 03/14/2016 0959    Radiological Exams on Admission: DG Chest Port 1  View  Result Date: 06/20/2023 CLINICAL DATA:  Pain after fall EXAM: PORTABLE CHEST 1 VIEW COMPARISON:  01/17/23 FINDINGS: Mild opacity at the right mid and lower lung. No pneumothorax, effusion or edema. Normal cardiopericardial silhouette. Overlapping cardiac leads. Film is under penetrated. IMPRESSION: Mild opacity at the right mid and lower lung. Acute infiltrates possible. Recommend follow-up to confirm clearance. Electronically Signed   By: Karen Kays M.D.   On: 06/20/2023 14:23   CT Head Wo Contrast  Result Date: 06/20/2023 CLINICAL DATA:  Polytrauma, blunt EXAM: CT HEAD WITHOUT CONTRAST CT CERVICAL SPINE WITHOUT CONTRAST TECHNIQUE: Multidetector CT imaging of the head and cervical spine was performed following the standard protocol without intravenous contrast. Multiplanar CT image reconstructions of the cervical spine were also generated. RADIATION DOSE REDUCTION: This exam was performed according to the departmental dose-optimization program which includes automated exposure control, adjustment of the mA and/or kV according to patient size and/or use of iterative reconstruction technique. COMPARISON:  None Available. FINDINGS: CT HEAD FINDINGS Brain: No evidence of acute infarction, hemorrhage, hydrocephalus, extra-axial collection or mass lesion/mass effect. Sequela of mild chronic microvascular ischemic change. Generalized volume loss. Prominent subarachnoid spaces bilaterally are likely related to the degree of volume loss. Vascular: No hyperdense vessel or unexpected calcification. Skull: Normal. Negative for fracture or focal lesion. Sinuses/Orbits: No middle ear or mastoid effusion. Paranasal sinuses are notable for complete opacification of the left frontal sinus. Orbits are unremarkable. Other: None. CT CERVICAL SPINE FINDINGS Alignment: Trace retrolisthesis of C5 on C6. Grade 1 anterolisthesis of C7 on T1. Skull base and vertebrae: No acute fracture. No primary bone lesion or focal  pathologic process. Soft tissues and spinal canal: No prevertebral fluid or swelling. No visible canal hematoma. Disc levels:  No evidence of high-grade spinal canal stenosis. Upper chest: Negative. Other: None IMPRESSION: 1. No acute intracranial abnormality. 2. No acute fracture or traumatic subluxation of the cervical spine. Electronically Signed   By: Lorenza Cambridge M.D.   On: 06/20/2023 14:15   CT Cervical Spine Wo Contrast  Result Date: 06/20/2023 CLINICAL DATA:  Polytrauma, blunt EXAM: CT HEAD WITHOUT CONTRAST CT CERVICAL SPINE WITHOUT CONTRAST TECHNIQUE: Multidetector CT imaging of the head and cervical spine was performed following the standard protocol without intravenous contrast. Multiplanar CT image reconstructions of the cervical spine were  also generated. RADIATION DOSE REDUCTION: This exam was performed according to the departmental dose-optimization program which includes automated exposure control, adjustment of the mA and/or kV according to patient size and/or use of iterative reconstruction technique. COMPARISON:  None Available. FINDINGS: CT HEAD FINDINGS Brain: No evidence of acute infarction, hemorrhage, hydrocephalus, extra-axial collection or mass lesion/mass effect. Sequela of mild chronic microvascular ischemic change. Generalized volume loss. Prominent subarachnoid spaces bilaterally are likely related to the degree of volume loss. Vascular: No hyperdense vessel or unexpected calcification. Skull: Normal. Negative for fracture or focal lesion. Sinuses/Orbits: No middle ear or mastoid effusion. Paranasal sinuses are notable for complete opacification of the left frontal sinus. Orbits are unremarkable. Other: None. CT CERVICAL SPINE FINDINGS Alignment: Trace retrolisthesis of C5 on C6. Grade 1 anterolisthesis of C7 on T1. Skull base and vertebrae: No acute fracture. No primary bone lesion or focal pathologic process. Soft tissues and spinal canal: No prevertebral fluid or swelling. No  visible canal hematoma. Disc levels:  No evidence of high-grade spinal canal stenosis. Upper chest: Negative. Other: None IMPRESSION: 1. No acute intracranial abnormality. 2. No acute fracture or traumatic subluxation of the cervical spine. Electronically Signed   By: Lorenza Cambridge M.D.   On: 06/20/2023 14:15    EKG: Independently reviewed.   Assessment/Plan  CAP -patient with increase Opacities on cxr unable to r/o infection  -levaquin de-escalate as able  -possible aspiration noted right sided infiltrate  -pulmonary toilet  -urine ag, sputum, f/u on culture data  -SLP consult  Hyponatremia -presumed due to low volume  - trial of gently hydration  -monitor Na level q 4-6 hours  - urine na , serum osmo ,tsh  Myeloproliferative d/o  Anemia  - noted hgb now 10 from around 12 2 mo ago  - no episodes of bleed  -presumed due to MDS  - will check anemia labs   CKDIIIb-IV -at baseline   GERD -ppi   Constipation  -resume bowel regimen   Atrial fib  - on eliquis  - patient with falls and is 89  - ? Benefit of Eliquis at this time vs risk -consider discuss with cardiology re benefit of continue use in this clinical setting    Hypertension -stable  -resume home regimen with amlodpine   Unsteady gait  -chronic -pt/ot to evaluate     DVT prophylaxis: on eliquis Code Status: full/ as discussed per patient wishes in event of cardiac arrest  Family Communication: none at bedside Disposition Plan: patient  expected to be admitted greater than 2 midnights  Consults called: n/a Admission status: med tele   Lurline Del MD [facility]  If 7PM-7AM, please contact night-coverage [email or web address] Password [email or web address]  06/20/2023, 5:11 PM  ]

## 2023-06-20 NOTE — Sepsis Progress Note (Signed)
Elink monitoring for the code sepsis protocol.  

## 2023-06-20 NOTE — Progress Notes (Signed)
Prior-To-Admission Oral Chemotherapy for Treatment of Oncologic Disease   Order noted from Dr. Maisie Fus to continue prior-to-admission oral chemotherapy regimen of hydroxyurea.  Procedure Per Pharmacy & Therapeutics Committee Policy: Orders for continuation of home oral chemotherapy for treatment of an oncologic disease will be held unless approved by an oncologist during current admission.    For patients receiving oncology care at [facility], inpatient pharmacist contacts patient's oncologist during regular office hours to review. If earlier review is medically necessary, attending physician consults [facility] on-call oncologist   For patients receiving oncology care outside of [facility], attending physician consults patient's oncologist to review. If this oncologist or their coverage cannot be reached, attending physician consults [facility] on-call oncologist   Oral chemotherapy continuation order is on hold pending oncologist review, [facility] oncologist Arbutus Ped will be notified by inpatient pharmacy during office hours    Junita Push PharmD 06/20/2023, 11:52 PM

## 2023-06-20 NOTE — Sepsis Progress Note (Signed)
Difficult stick for labs/IV's

## 2023-06-20 NOTE — ED Notes (Signed)
Unsuccessful attempt at obtaining second set of blood cultures. Will start antibiotics to prevent delay

## 2023-06-20 NOTE — ED Notes (Signed)
Pt encouraged to urinate

## 2023-06-20 NOTE — ED Notes (Signed)
ED TO INPATIENT HANDOFF REPORT  ED Nurse Name and Phone #: Thamas Jaegers Name/Age/Gender Adam Tapia 89 y.o. male Room/Bed: WA05/WA05  Code Status   Code Status: Full Code  Home/SNF/Other Home Patient oriented to: self, place, time, and situation Is this baseline? Yes   Triage Complete: Triage complete  Chief Complaint CAP (community acquired pneumonia) [J18.9]  Triage Note BIB EMS from home for multiple falls over the last 2 days. Pt had mechanical fall today with no injury, but family was unable to get him up which was unusual for pt. Pt has been weaker today than normal. Fall yesterday was mechanical and pt hit his face and has abrasion to left side of nose and tip of nose. Pt currently being treated of diarrhea by PCP that he has had for about a month. Oral temp 101.7 with EMS, was treated with tylenol and fluids.    Allergies Allergies  Allergen Reactions   Nsaids Other (See Comments)    Level of Care/Admitting Diagnosis ED Disposition     ED Disposition  Admit   Condition  --   Comment  Hospital Area: [facility] [[id number]]  Level of Care: Telemetry [5]  Admit to tele based on following criteria: Other see comments  Comments: pna  May admit patient to [facility] or [facility] if equivalent level of care is available:: No  Covid Evaluation: Confirmed COVID Negative  Diagnosis: CAP (community acquired pneumonia) [562130]  Admitting Physician: Lurline Del [[id number]]  Attending Physician: Lurline Del [[id number]]  Certification:: I certify this patient is being admitted for an inpatient-only procedure  Estimated Length of Stay: 3          B Medical/Surgery History Past Medical History:  Diagnosis Date   Acute blood loss anemia    AF (atrial fibrillation) (HCC)    Allergic rhinitis    Anaphylactic reaction 01/17/2023   Anemia    Arthritis    BPH (benign prostatic hyperplasia)    Constipation    Dysrhythmia    PT  STATES HAS HX A FIB - FOLLOWED BY DR.KEVIN LITTLE   GERD (gastroesophageal reflux disease)    OCCASIONAL - HAS NEXIUM IF NEEDED - NOT CURRENTLY TAKING   Hypertension    Myeloproliferative disorder (HCC)    [facility]us [facility]   Nocturia    Primary osteoarthritis of left hip    Psoriasis    Unsteady gait    Past Surgical History:  Procedure Laterality Date   HERNIA REPAIR  1998   TOTAL HIP ARTHROPLASTY Left 03/11/2016   Procedure: LEFT TOTAL HIP ARTHROPLASTY ANTERIOR APPROACH;  Surgeon: Kathryne Hitch, MD;  Location: [facility];  Service: Orthopedics;  Laterality: Left;  Went from Spinal to an LMA     A IV Location/Drains/Wounds Patient Lines/Drains/Airways Status     Active Line/Drains/Airways     Name Placement date Placement time Site Days   Peripheral IV 06/20/23 18 G Left Antecubital 06/20/23  1245  Antecubital  less than 1   Peripheral IV 06/20/23 20 G Right Antecubital 06/20/23  1740  Antecubital  less than 1            Intake/Output Last 24 hours  Intake/Output Summary (Last 24 hours) at 06/20/2023 1805 Last data filed at 06/20/2023 1551 Gross per 24 hour  Intake 1000 ml  Output --  Net 1000 ml    Labs/Imaging Results for orders placed or performed during the hospital encounter of  06/20/23 (from the past 48 hour(s))  SARS Coronavirus 2 by RT PCR (hospital order, performed in [facility] hospital lab) *cepheid single result test* Anterior Nasal Swab     Status: None   Collection Time: 06/20/23  2:02 PM   Specimen: Anterior Nasal Swab  Result Value Ref Range   SARS Coronavirus 2 by RT PCR NEGATIVE NEGATIVE    Comment: (NOTE) SARS-CoV-2 target nucleic acids are NOT DETECTED.  The SARS-CoV-2 RNA is generally detectable in upper and lower respiratory specimens during the acute phase of infection. The lowest concentration of SARS-CoV-2 viral copies this assay can detect is 250 copies / mL. A negative result does not preclude SARS-CoV-2  infection and should not be used as the sole basis for treatment or other patient management decisions.  A negative result may occur with improper specimen collection / handling, submission of specimen other than nasopharyngeal swab, presence of viral mutation(s) within the areas targeted by this assay, and inadequate number of viral copies (<250 copies / mL). A negative result must be combined with clinical observations, patient history, and epidemiological information.  Fact Sheet for Patients:   RoadLapTop.co.za  Fact Sheet for Healthcare Providers: http://kim-miller.com/  This test is not yet approved or  cleared by the [place] FDA and has been authorized for detection and/or diagnosis of SARS-CoV-2 by FDA under an Emergency Use Authorization (EUA).  This EUA will remain in effect (meaning this test can be used) for the duration of the COVID-19 declaration under Section 564(b)(1) of the Act, 21 U.S.C. section 360bbb-3(b)(1), unless the authorization is terminated or revoked sooner.  Performed at [facility], [address], [place], [place] [address]   Resp panel by RT-PCR (RSV, Flu A&B, Covid) Anterior Nasal Swab     Status: None   Collection Time: 06/20/23  2:02 PM   Specimen: Anterior Nasal Swab  Result Value Ref Range   SARS Coronavirus 2 by RT PCR NEGATIVE NEGATIVE    Comment: (NOTE) SARS-CoV-2 target nucleic acids are NOT DETECTED.  The SARS-CoV-2 RNA is generally detectable in upper respiratory specimens during the acute phase of infection. The lowest concentration of SARS-CoV-2 viral copies this assay can detect is 138 copies/mL. A negative result does not preclude SARS-Cov-2 infection and should not be used as the sole basis for treatment or other patient management decisions. A negative result may occur with  improper specimen collection/handling, submission of specimen other than  nasopharyngeal swab, presence of viral mutation(s) within the areas targeted by this assay, and inadequate number of viral copies(<138 copies/mL). A negative result must be combined with clinical observations, patient history, and epidemiological information. The expected result is Negative.  Fact Sheet for Patients:  BloggerCourse.com  Fact Sheet for Healthcare Providers:  SeriousBroker.it  This test is no t yet approved or cleared by the [place] FDA and  has been authorized for detection and/or diagnosis of SARS-CoV-2 by FDA under an Emergency Use Authorization (EUA). This EUA will remain  in effect (meaning this test can be used) for the duration of the COVID-19 declaration under Section 564(b)(1) of the Act, 21 U.S.C.section 360bbb-3(b)(1), unless the authorization is terminated  or revoked sooner.       Influenza A by PCR NEGATIVE NEGATIVE   Influenza B by PCR NEGATIVE NEGATIVE    Comment: (NOTE) The Xpert Xpress SARS-CoV-2/FLU/RSV plus assay is intended as an aid in the diagnosis of influenza from Nasopharyngeal swab specimens and should not be used as a sole basis  for treatment. Nasal washings and aspirates are unacceptable for Xpert Xpress SARS-CoV-2/FLU/RSV testing.  Fact Sheet for Patients: BloggerCourse.com  Fact Sheet for Healthcare Providers: SeriousBroker.it  This test is not yet approved or cleared by the [place] FDA and has been authorized for detection and/or diagnosis of SARS-CoV-2 by FDA under an Emergency Use Authorization (EUA). This EUA will remain in effect (meaning this test can be used) for the duration of the COVID-19 declaration under Section 564(b)(1) of the Act, 21 U.S.C. section 360bbb-3(b)(1), unless the authorization is terminated or revoked.     Resp Syncytial Virus by PCR NEGATIVE NEGATIVE    Comment: (NOTE) Fact Sheet for  Patients: BloggerCourse.com  Fact Sheet for Healthcare Providers: SeriousBroker.it  This test is not yet approved or cleared by the [place] FDA and has been authorized for detection and/or diagnosis of SARS-CoV-2 by FDA under an Emergency Use Authorization (EUA). This EUA will remain in effect (meaning this test can be used) for the duration of the COVID-19 declaration under Section 564(b)(1) of the Act, 21 U.S.C. section 360bbb-3(b)(1), unless the authorization is terminated or revoked.  Performed at [facility], [address], [place], [place] [address]   Comprehensive metabolic panel     Status: Abnormal   Collection Time: 06/20/23  2:10 PM  Result Value Ref Range   Sodium 127 (L) 135 - 145 mmol/L   Potassium 3.6 3.5 - 5.1 mmol/L   Chloride 98 98 - 111 mmol/L   CO2 18 (L) 22 - 32 mmol/L   Glucose, Bld 122 (H) 70 - 99 mg/dL    Comment: Glucose reference range applies only to samples taken after fasting for at least 8 hours.   BUN 22 8 - 23 mg/dL   Creatinine, Ser 6.04 (H) 0.61 - 1.24 mg/dL   Calcium 8.2 (L) 8.9 - 10.3 mg/dL   Total Protein 6.9 6.5 - 8.1 g/dL   Albumin 2.7 (L) 3.5 - 5.0 g/dL   AST 24 15 - 41 U/L   ALT 13 0 - 44 U/L   Alkaline Phosphatase 33 (L) 38 - 126 U/L   Total Bilirubin 0.6 0.3 - 1.2 mg/dL   GFR, Estimated 38 (L) >60 mL/min    Comment: (NOTE) Calculated using the CKD-EPI Creatinine Equation (2021)    Anion gap 11 5 - 15    Comment: Performed at [facility], [address], [place], [place] [address]  CBC with Differential     Status: Abnormal (Preliminary result)   Collection Time: 06/20/23  2:10 PM  Result Value Ref Range   WBC 6.4 4.0 - 10.5 K/uL   RBC 2.32 (L) 4.22 - 5.81 MIL/uL   Hemoglobin 10.1 (L) 13.0 - 17.0 g/dL   HCT 11.9 (L) 14.7 - 82.9 %   MCV 123.3 (H) 80.0 - 100.0 fL   MCH 43.5 (H) 26.0 - 34.0 pg   MCHC 35.3 30.0 - 36.0 g/dL   RDW 56.2  13.0 - 86.5 %   Platelets 154 150 - 400 K/uL   nRBC 0.0 0.0 - 0.2 %    Comment: Performed at [facility], [address], [place], [place] [address]   Neutrophils Relative % PENDING %   Neutro Abs PENDING 1.7 - 7.7 K/uL   Band Neutrophils PENDING %   Lymphocytes Relative PENDING %   Lymphs Abs PENDING 0.7 - 4.0 K/uL   Monocytes Relative PENDING %   Monocytes Absolute PENDING 0.1 - 1.0 K/uL  Eosinophils Relative PENDING %   Eosinophils Absolute PENDING 0.0 - 0.5 K/uL   Basophils Relative PENDING %   Basophils Absolute PENDING 0.0 - 0.1 K/uL   WBC Morphology PENDING    RBC Morphology PENDING    Smear Review PENDING    Other PENDING %   nRBC PENDING 0 /100 WBC   Metamyelocytes Relative PENDING %   Myelocytes PENDING %   Promyelocytes Relative PENDING %   Blasts PENDING %   Immature Granulocytes PENDING %   Abs Immature Granulocytes PENDING 0.00 - 0.07 K/uL  Urinalysis, Routine w reflex microscopic -Urine, Clean Catch     Status: Abnormal   Collection Time: 06/20/23  3:53 PM  Result Value Ref Range   Color, Urine YELLOW YELLOW   APPearance HAZY (A) CLEAR   Specific Gravity, Urine 1.020 1.005 - 1.030   pH 5.0 5.0 - 8.0   Glucose, UA NEGATIVE NEGATIVE mg/dL   Hgb urine dipstick MODERATE (A) NEGATIVE   Bilirubin Urine NEGATIVE NEGATIVE   Ketones, ur 5 (A) NEGATIVE mg/dL   Protein, ur 272 (A) NEGATIVE mg/dL   Nitrite NEGATIVE NEGATIVE   Leukocytes,Ua NEGATIVE NEGATIVE   RBC / HPF 0-5 0 - 5 RBC/hpf   WBC, UA 0-5 0 - 5 WBC/hpf   Bacteria, UA RARE (A) NONE SEEN   Squamous Epithelial / HPF 0-5 0 - 5 /HPF   Mucus PRESENT    Hyaline Casts, UA PRESENT    Granular Casts, UA PRESENT     Comment: Performed at [facility], [address], [place], [place] [address]  I-Stat Lactic Acid, ED     Status: Abnormal   Collection Time: 06/20/23  5:54 PM  Result Value Ref Range   Lactic Acid, Venous 2.5 (HH) 0.5 - 1.9 mmol/L   Comment NOTIFIED  PHYSICIAN    DG Chest Port 1 View  Result Date: 06/20/2023 CLINICAL DATA:  Pain after fall EXAM: PORTABLE CHEST 1 VIEW COMPARISON:  01/17/23 FINDINGS: Mild opacity at the right mid and lower lung. No pneumothorax, effusion or edema. Normal cardiopericardial silhouette. Overlapping cardiac leads. Film is under penetrated. IMPRESSION: Mild opacity at the right mid and lower lung. Acute infiltrates possible. Recommend follow-up to confirm clearance. Electronically Signed   By: Karen Kays M.D.   On: 06/20/2023 14:23   CT Head Wo Contrast  Result Date: 06/20/2023 CLINICAL DATA:  Polytrauma, blunt EXAM: CT HEAD WITHOUT CONTRAST CT CERVICAL SPINE WITHOUT CONTRAST TECHNIQUE: Multidetector CT imaging of the head and cervical spine was performed following the standard protocol without intravenous contrast. Multiplanar CT image reconstructions of the cervical spine were also generated. RADIATION DOSE REDUCTION: This exam was performed according to the departmental dose-optimization program which includes automated exposure control, adjustment of the mA and/or kV according to patient size and/or use of iterative reconstruction technique. COMPARISON:  None Available. FINDINGS: CT HEAD FINDINGS Brain: No evidence of acute infarction, hemorrhage, hydrocephalus, extra-axial collection or mass lesion/mass effect. Sequela of mild chronic microvascular ischemic change. Generalized volume loss. Prominent subarachnoid spaces bilaterally are likely related to the degree of volume loss. Vascular: No hyperdense vessel or unexpected calcification. Skull: Normal. Negative for fracture or focal lesion. Sinuses/Orbits: No middle ear or mastoid effusion. Paranasal sinuses are notable for complete opacification of the left frontal sinus. Orbits are unremarkable. Other: None. CT CERVICAL SPINE FINDINGS Alignment: Trace retrolisthesis of C5 on C6. Grade 1 anterolisthesis of C7 on T1. Skull base and vertebrae: No acute fracture. No primary  bone lesion  or focal pathologic process. Soft tissues and spinal canal: No prevertebral fluid or swelling. No visible canal hematoma. Disc levels:  No evidence of high-grade spinal canal stenosis. Upper chest: Negative. Other: None IMPRESSION: 1. No acute intracranial abnormality. 2. No acute fracture or traumatic subluxation of the cervical spine. Electronically Signed   By: Lorenza Cambridge M.D.   On: 06/20/2023 14:15   CT Cervical Spine Wo Contrast  Result Date: 06/20/2023 CLINICAL DATA:  Polytrauma, blunt EXAM: CT HEAD WITHOUT CONTRAST CT CERVICAL SPINE WITHOUT CONTRAST TECHNIQUE: Multidetector CT imaging of the head and cervical spine was performed following the standard protocol without intravenous contrast. Multiplanar CT image reconstructions of the cervical spine were also generated. RADIATION DOSE REDUCTION: This exam was performed according to the departmental dose-optimization program which includes automated exposure control, adjustment of the mA and/or kV according to patient size and/or use of iterative reconstruction technique. COMPARISON:  None Available. FINDINGS: CT HEAD FINDINGS Brain: No evidence of acute infarction, hemorrhage, hydrocephalus, extra-axial collection or mass lesion/mass effect. Sequela of mild chronic microvascular ischemic change. Generalized volume loss. Prominent subarachnoid spaces bilaterally are likely related to the degree of volume loss. Vascular: No hyperdense vessel or unexpected calcification. Skull: Normal. Negative for fracture or focal lesion. Sinuses/Orbits: No middle ear or mastoid effusion. Paranasal sinuses are notable for complete opacification of the left frontal sinus. Orbits are unremarkable. Other: None. CT CERVICAL SPINE FINDINGS Alignment: Trace retrolisthesis of C5 on C6. Grade 1 anterolisthesis of C7 on T1. Skull base and vertebrae: No acute fracture. No primary bone lesion or focal pathologic process. Soft tissues and spinal canal: No prevertebral  fluid or swelling. No visible canal hematoma. Disc levels:  No evidence of high-grade spinal canal stenosis. Upper chest: Negative. Other: None IMPRESSION: 1. No acute intracranial abnormality. 2. No acute fracture or traumatic subluxation of the cervical spine. Electronically Signed   By: Lorenza Cambridge M.D.   On: 06/20/2023 14:15    Pending Labs Unresulted Labs (From admission, onward)     Start     Ordered   06/21/23 0500  Comprehensive metabolic panel  Tomorrow morning,   R        06/20/23 1801   06/21/23 0500  CBC  Tomorrow morning,   R        06/20/23 1801   06/20/23 1801  Osmolality, urine  Once,   R        06/20/23 1801   06/20/23 1801  Sodium  Now then every 6 hours,   R      06/20/23 1801   06/20/23 1800  Basic metabolic panel  Once,   R        06/20/23 1801   06/20/23 1800  Magnesium  Once,   R        06/20/23 1801   06/20/23 1800  Expectorated Sputum Assessment w Gram Stain, Rflx to Resp Cult  Once,   R        06/20/23 1801   06/20/23 1800  Brain natriuretic peptide  Once,   R        06/20/23 1801   06/20/23 1800  Hemoglobin A1c  Once,   R        06/20/23 1801   06/20/23 1800  TSH  Once,   R        06/20/23 1801   06/20/23 1800  Sodium, urine, random  Once,   R        06/20/23 1801   06/20/23 1751  Expectorated Sputum Assessment w Gram Stain, Rflx to Resp Cult  (COPD / Pneumonia / Cellulitis / Lower Extremity Wound)  Once,   R        06/20/23 1801   06/20/23 1751  Culture, blood (routine x 2) Call MD if unable to obtain prior to antibiotics being given  (COPD / Pneumonia / Cellulitis / Lower Extremity Wound)  BLOOD CULTURE X 2,   R     Comments: If blood cultures drawn in Emergency Department - Do not draw and cancel order    06/20/23 1801   06/20/23 1751  Legionella Pneumophila Serogp 1 Ur Ag  (COPD / Pneumonia / Cellulitis / Lower Extremity Wound)  Once,   R        06/20/23 1801   06/20/23 1751  Strep pneumoniae urinary antigen  (COPD / Pneumonia / Cellulitis / Lower  Extremity Wound)  Once,   R        06/20/23 1801   06/20/23 1639  Protime-INR  (Septic presentation on arrival (screening labs, nursing and treatment orders for obvious sepsis))  ONCE - STAT,   STAT        06/20/23 1639   06/20/23 1639  APTT  (Septic presentation on arrival (screening labs, nursing and treatment orders for obvious sepsis))  ONCE - STAT,   STAT        06/20/23 1639   06/20/23 1639  Blood Culture (routine x 2)  (Septic presentation on arrival (screening labs, nursing and treatment orders for obvious sepsis))  BLOOD CULTURE X 2,   STAT      06/20/23 1639            Vitals/Pain Today's Vitals   06/20/23 1400 06/20/23 1522 06/20/23 1524 06/20/23 1600  BP: (!) 100/58  (!) 141/70 (!) 145/59  Pulse: 88  75 79  Resp: (!) 28 (!) 24 18 (!) 22  Temp:   98.7 F (37.1 C)   TempSrc:   Oral   SpO2: 100%  98% 98%  Weight:      Height:      PainSc:        Isolation Precautions Airborne and Contact precautions  Medications Medications  acetaminophen (TYLENOL) tablet 650 mg (650 mg Oral Not Given 06/20/23 1402)  cefTRIAXone (ROCEPHIN) 1 g in sodium chloride 0.9 % 100 mL IVPB (1 g Intravenous New Bag/Given 06/20/23 1744)  azithromycin (ZITHROMAX) 500 mg in sodium chloride 0.9 % 250 mL IVPB (has no administration in time range)  lactated ringers infusion ( Intravenous New Bag/Given 06/20/23 1744)  cefTRIAXone (ROCEPHIN) 2 g in sodium chloride 0.9 % 100 mL IVPB (has no administration in time range)  azithromycin (ZITHROMAX) 500 mg in sodium chloride 0.9 % 250 mL IVPB (has no administration in time range)  0.9 %  sodium chloride infusion (has no administration in time range)  acetaminophen (TYLENOL) tablet 650 mg (has no administration in time range)    Or  acetaminophen (TYLENOL) suppository 650 mg (has no administration in time range)  ondansetron (ZOFRAN) tablet 4 mg (has no administration in time range)    Or  ondansetron (ZOFRAN) injection 4 mg (has no administration in time  range)  albuterol (PROVENTIL) (2.5 MG/3ML) 0.083% nebulizer solution 2.5 mg (has no administration in time range)  lactated ringers bolus 1,000 mL (0 mLs Intravenous Stopped 06/20/23 1551)    Mobility walks with device     Focused Assessments     R Recommendations: See Admitting Provider Note  Report given  to:   Additional Notes:

## 2023-06-20 NOTE — ED Triage Notes (Signed)
BIB EMS from home for multiple falls over the last 2 days. Pt had mechanical fall today with no injury, but family was unable to get him up which was unusual for pt. Pt has been weaker today than normal. Fall yesterday was mechanical and pt hit his face and has abrasion to left side of nose and tip of nose. Pt currently being treated of diarrhea by PCP that he has had for about a month. Oral temp 101.7 with EMS, was treated with tylenol and fluids.

## 2023-06-20 NOTE — ED Provider Notes (Signed)
[place] EMERGENCY DEPARTMENT AT [facility] Provider Note   CSN: [id number] Arrival date & time: 06/20/23  1231     History  Chief Complaint  Patient presents with   Fall   Weakness    Adam Tapia is a 89 y.o. male.  HPI     89 y.o. male with medical history significant of hypertension, atrial fibrillation, myeloproliferative disorder, CKD stage IIIb, and BPH who presents with with complaints of weakness, fall. Patient per family had fall yesterday that was mechanical with  head strike resulting in abrasion on left side of his nose. Of note family states patient has had increase stools x 1 month and is currently undergoing evaluation by pcp.   Pt had fevers per EMS and received tylenol 1 gram. Home Medications Prior to Admission medications   Medication Sig Start Date End Date Taking? Authorizing Provider  acetaminophen (TYLENOL) 325 MG tablet Take 650 mg by mouth daily as needed for moderate pain.    [provider]  allopurinol (ZYLOPRIM) 100 MG tablet TAKE 1 TABLET BY MOUTH EVERY DAY Patient taking differently: Take 100 mg by mouth daily as needed (gout). 02/10/21   Si Gaul, MD  amLODipine (NORVASC) 5 MG tablet Take 5 mg by mouth daily.    [provider]  atenolol (TENORMIN) 25 MG tablet Take 25 mg by mouth daily. 07/18/15   [provider]  candesartan (ATACAND) 8 MG tablet Take 8 mg by mouth daily. 06/26/19   [provider]  fexofenadine (ALLEGRA) 180 MG tablet Take 180 mg by mouth daily.    [provider]  hydroxyurea (HYDREA) 500 MG capsule TAKE 1 CAPSULE BY MOUTH TWICE A DAY (MAY TAKE WITH FOOD TO MINIMIZE SIDE EFFECTS) 05/22/23   Si Gaul, MD  ketotifen (ZADITOR) 0.025 % ophthalmic solution Place 1 drop into both eyes daily as needed (itching eyes).    [provider]  promethazine-dextromethorphan (PROMETHAZINE-DM) 6.25-15 MG/5ML syrup Take 2.5-5 mLs by mouth 4 (four) times daily as  needed for cough. Patient not taking: Reported on 01/17/2023 05/23/21   Tommie Sams, DO  TURMERIC PO Take 1 tablet by mouth daily. Patient not taking: Reported on 01/17/2023    [provider]      Allergies    Nsaids    Review of Systems   Review of Systems  All other systems reviewed and are negative.   Physical Exam Updated Vital Signs BP (!) 145/59   Pulse 79   Temp 98.7 F (37.1 C) (Oral)   Resp (!) 22   Ht 5\' 8"  (1.727 m)   Wt 81.6 kg   SpO2 98%   BMI 27.37 kg/m  Physical Exam Vitals and nursing note reviewed.  Constitutional:      Appearance: He is well-developed.  HENT:     Head: Atraumatic.  Cardiovascular:     Rate and Rhythm: Normal rate.  Pulmonary:     Effort: Pulmonary effort is normal.  Abdominal:     Tenderness: There is no abdominal tenderness.  Musculoskeletal:     Cervical back: Neck supple.  Skin:    General: Skin is warm.  Neurological:     Mental Status: He is alert and oriented to person, place, and time.     ED Results / Procedures / Treatments   Labs (all labs ordered are listed, but only abnormal results are displayed) Labs Reviewed  COMPREHENSIVE METABOLIC PANEL - Abnormal; Notable for the following components:  Result Value   Sodium 127 (*)    CO2 18 (*)    Glucose, Bld 122 (*)    Creatinine, Ser 1.63 (*)    Calcium 8.2 (*)    Albumin 2.7 (*)    Alkaline Phosphatase 33 (*)    GFR, Estimated 38 (*)    All other components within normal limits  CBC WITH DIFFERENTIAL/PLATELET - Abnormal; Notable for the following components:   RBC 2.32 (*)    Hemoglobin 10.1 (*)    HCT 28.6 (*)    MCV 123.3 (*)    MCH 43.5 (*)    All other components within normal limits  SARS CORONAVIRUS 2 BY RT PCR  RESP PANEL BY RT-PCR (RSV, FLU A&B, COVID)  [id number]  CULTURE, BLOOD (ROUTINE X 2)  CULTURE, BLOOD (ROUTINE X 2)  URINALYSIS, ROUTINE W REFLEX MICROSCOPIC  PROTIME-INR  APTT  I-STAT CG4 LACTIC ACID, ED     EKG None  Radiology DG Chest Port 1 View  Result Date: 06/20/2023 CLINICAL DATA:  Pain after fall EXAM: PORTABLE CHEST 1 VIEW COMPARISON:  01/17/23 FINDINGS: Mild opacity at the right mid and lower lung. No pneumothorax, effusion or edema. Normal cardiopericardial silhouette. Overlapping cardiac leads. Film is under penetrated. IMPRESSION: Mild opacity at the right mid and lower lung. Acute infiltrates possible. Recommend follow-up to confirm clearance. Electronically Signed   By: Karen Kays M.D.   On: 06/20/2023 14:23   CT Head Wo Contrast  Result Date: 06/20/2023 CLINICAL DATA:  Polytrauma, blunt EXAM: CT HEAD WITHOUT CONTRAST CT CERVICAL SPINE WITHOUT CONTRAST TECHNIQUE: Multidetector CT imaging of the head and cervical spine was performed following the standard protocol without intravenous contrast. Multiplanar CT image reconstructions of the cervical spine were also generated. RADIATION DOSE REDUCTION: This exam was performed according to the departmental dose-optimization program which includes automated exposure control, adjustment of the mA and/or kV according to patient size and/or use of iterative reconstruction technique. COMPARISON:  None Available. FINDINGS: CT HEAD FINDINGS Brain: No evidence of acute infarction, hemorrhage, hydrocephalus, extra-axial collection or mass lesion/mass effect. Sequela of mild chronic microvascular ischemic change. Generalized volume loss. Prominent subarachnoid spaces bilaterally are likely related to the degree of volume loss. Vascular: No hyperdense vessel or unexpected calcification. Skull: Normal. Negative for fracture or focal lesion. Sinuses/Orbits: No middle ear or mastoid effusion. Paranasal sinuses are notable for complete opacification of the left frontal sinus. Orbits are unremarkable. Other: None. CT CERVICAL SPINE FINDINGS Alignment: Trace retrolisthesis of C5 on C6. Grade 1 anterolisthesis of C7 on T1. Skull base and vertebrae: No acute  fracture. No primary bone lesion or focal pathologic process. Soft tissues and spinal canal: No prevertebral fluid or swelling. No visible canal hematoma. Disc levels:  No evidence of high-grade spinal canal stenosis. Upper chest: Negative. Other: None IMPRESSION: 1. No acute intracranial abnormality. 2. No acute fracture or traumatic subluxation of the cervical spine. Electronically Signed   By: Lorenza Cambridge M.D.   On: 06/20/2023 14:15   CT Cervical Spine Wo Contrast  Result Date: 06/20/2023 CLINICAL DATA:  Polytrauma, blunt EXAM: CT HEAD WITHOUT CONTRAST CT CERVICAL SPINE WITHOUT CONTRAST TECHNIQUE: Multidetector CT imaging of the head and cervical spine was performed following the standard protocol without intravenous contrast. Multiplanar CT image reconstructions of the cervical spine were also generated. RADIATION DOSE REDUCTION: This exam was performed according to the departmental dose-optimization program which includes automated exposure control, adjustment of the mA and/or kV according to patient size and/or use of iterative  reconstruction technique. COMPARISON:  None Available. FINDINGS: CT HEAD FINDINGS Brain: No evidence of acute infarction, hemorrhage, hydrocephalus, extra-axial collection or mass lesion/mass effect. Sequela of mild chronic microvascular ischemic change. Generalized volume loss. Prominent subarachnoid spaces bilaterally are likely related to the degree of volume loss. Vascular: No hyperdense vessel or unexpected calcification. Skull: Normal. Negative for fracture or focal lesion. Sinuses/Orbits: No middle ear or mastoid effusion. Paranasal sinuses are notable for complete opacification of the left frontal sinus. Orbits are unremarkable. Other: None. CT CERVICAL SPINE FINDINGS Alignment: Trace retrolisthesis of C5 on C6. Grade 1 anterolisthesis of C7 on T1. Skull base and vertebrae: No acute fracture. No primary bone lesion or focal pathologic process. Soft tissues and spinal  canal: No prevertebral fluid or swelling. No visible canal hematoma. Disc levels:  No evidence of high-grade spinal canal stenosis. Upper chest: Negative. Other: None IMPRESSION: 1. No acute intracranial abnormality. 2. No acute fracture or traumatic subluxation of the cervical spine. Electronically Signed   By: Lorenza Cambridge M.D.   On: 06/20/2023 14:15    Procedures Procedures    Medications Ordered in ED Medications  acetaminophen (TYLENOL) tablet 650 mg (650 mg Oral Not Given 06/20/23 1402)  cefTRIAXone (ROCEPHIN) 1 g in sodium chloride 0.9 % 100 mL IVPB (has no administration in time range)  azithromycin (ZITHROMAX) 500 mg in sodium chloride 0.9 % 250 mL IVPB (has no administration in time range)  lactated ringers infusion (has no administration in time range)  lactated ringers bolus 1,000 mL (0 mLs Intravenous Stopped 06/20/23 1551)    ED Course/ Medical Decision Making/ A&P                                 Medical Decision Making Amount and/or Complexity of Data Reviewed Labs: ordered. Radiology: ordered. ECG/medicine tests: ordered.  Risk OTC drugs. Prescription drug management. Decision regarding hospitalization.  Patient with history of A-fib, hypertension comes in with chief complaint of fever, weakness, falls.  Differential diagnosis considered for this patient includes: ICH / Stroke, acute coronary syndrome, Infection - UTI/Pneumonia/soft tissue infection leading to encephalopathy, encephalopathy due to electrolyte abnormality or drug interactions or toxins or metabolic conditions like adrenal insufficiency, hyperglycemia, paraneoplastic process, Hypercapnia / COPD, Hypoxia  Patient's workup reveals hyponatremia along with questionable pneumonia.  We have covered him for CAP.  Clinically not septic, no endorgan damage seen.  Hospitalist team is still requesting blood cultures - they have been ordered.   Final Clinical Impression(s) / ED Diagnoses Final diagnoses:  Acute  hyponatremia  Community acquired pneumonia, unspecified laterality    Rx / DC Orders ED Discharge Orders     None         Derwood Kaplan, MD 06/23/23 [id number]

## 2023-06-21 DIAGNOSIS — E871 Hypo-osmolality and hyponatremia: Secondary | ICD-10-CM | POA: Diagnosis not present

## 2023-06-21 DIAGNOSIS — J189 Pneumonia, unspecified organism: Secondary | ICD-10-CM | POA: Diagnosis not present

## 2023-06-21 LAB — LACTIC ACID, PLASMA: Lactic Acid, Venous: 1.1 mmol/L (ref 0.5–1.9)

## 2023-06-21 MED ORDER — RISAQUAD PO CAPS
2.0000 | ORAL_CAPSULE | Freq: Every day | ORAL | Status: DC
  Administered 2023-06-21 – 2023-06-26 (×6): 2 via ORAL
  Filled 2023-06-21 (×6): qty 2

## 2023-06-21 MED ORDER — HYDROXYUREA 500 MG PO CAPS
500.0000 mg | ORAL_CAPSULE | Freq: Two times a day (BID) | ORAL | Status: DC
  Administered 2023-06-21 – 2023-06-25 (×9): 500 mg via ORAL
  Filled 2023-06-21 (×9): qty 1

## 2023-06-21 NOTE — Evaluation (Signed)
Physical Therapy Evaluation Patient Details Name: Adam Tapia MRN: [id number] DOB: 03-18-1923 Today's Date: 06/21/2023  History of Present Illness  89 y.o. male with medical history significant of   Atrial fibrillation, Hypertension,Myeloproliferative disorder, GERD, constipation ,unsteady gait ,psoriasis,CKDIIIb-IV who presents to ED BIBEMS  with complaint of multiple falls over the last 2 days. Patient has per family been weaker over the last few days with recurrent falls.  Patient per family had fall on day prior to admission that was mechanical with  head strike resulting in abrasion on left side of his nose. Of note family states patient has had increase stools x 1 month. Dx of PNA.  Clinical Impression  Pt admitted with above diagnosis. Pt ambulated 56' with RW with no loss of balance. Mod assist supine to sit and mod assist sit to stand, pt initially unsteady with posterior lean in standing. SpO2 92% on room air walking, HR 84, no dyspnea. Pt may need ST-SNF depending on progress and ability of pt's family to provide needed level of care.  Pt currently with functional limitations due to the deficits listed below (see PT Problem List). Pt will benefit from acute skilled PT to increase their independence and safety with mobility to allow discharge.           If plan is discharge home, recommend the following: A lot of help with walking and/or transfers;A lot of help with bathing/dressing/bathroom;Assistance with cooking/housework;Help with stairs or ramp for entrance   Can travel by private vehicle   Yes    Equipment Recommendations Rolling walker (2 wheels)  Recommendations for Other Services       Functional Status Assessment Patient has had a recent decline in their functional status and demonstrates the ability to make significant improvements in function in a reasonable and predictable amount of time.     Precautions / Restrictions Precautions Precautions: Fall Precaution  Comments: fell on day prior to admission, pt reports 2 other falls in past 6 months Restrictions Weight Bearing Restrictions: No      Mobility  Bed Mobility Overal bed mobility: Needs Assistance Bed Mobility: Supine to Sit     Supine to sit: Mod assist     General bed mobility comments: assist to raise trunk    Transfers Overall transfer level: Needs assistance Equipment used: Rolling walker (2 wheels) Transfers: Sit to/from Stand Sit to Stand: From elevated surface, Mod assist           General transfer comment: mod A to power up and steady 2* posterior lean    Ambulation/Gait Ambulation/Gait assistance: Min guard Gait Distance (Feet): 80 Feet Assistive device: Rolling walker (2 wheels) Gait Pattern/deviations: Decreased step length - right, Decreased step length - left, Trunk flexed Gait velocity: WFL     General Gait Details: ongoing verbal cues to step closer to frame of RW, no loss of balance, no dyspnea, SpO2 92% on room air walking, HR 84  Stairs            Wheelchair Mobility     Tilt Bed    Modified Rankin (Stroke Patients Only)       Balance Overall balance assessment: History of Falls, Needs assistance Sitting-balance support: Feet supported, No upper extremity supported Sitting balance-Leahy Scale: Fair     Standing balance support: Bilateral upper extremity supported, During functional activity, Reliant on assistive device for balance Standing balance-Leahy Scale: Poor Standing balance comment: posterior lean initially in standing  Pertinent Vitals/Pain Pain Assessment Pain Assessment: No/denies pain    Home Living Family/patient expects to be discharged to:: Private residence Living Arrangements: Spouse/significant other Available Help at Discharge: Family Type of Home: House           Home Equipment: Gilmer Mor - single point      Prior Function Prior Level of Function :  Independent/Modified Independent             Mobility Comments: walks a cane, per chart pt had several falls just prior to admission ADLs Comments: independent per pt report     Hand Dominance        Extremity/Trunk Assessment   Upper Extremity Assessment Upper Extremity Assessment: Overall WFL for tasks assessed    Lower Extremity Assessment Lower Extremity Assessment: Overall WFL for tasks assessed    Cervical / Trunk Assessment Cervical / Trunk Assessment: Kyphotic  Communication   Communication: No difficulties  Cognition Arousal/Alertness: Awake/alert Behavior During Therapy: WFL for tasks assessed/performed Overall Cognitive Status: Within Functional Limits for tasks assessed                                          General Comments      Exercises     Assessment/Plan    PT Assessment Patient needs continued PT services  PT Problem List Decreased activity tolerance;Decreased mobility       PT Treatment Interventions DME instruction;Gait training;Functional mobility training;Balance training;Therapeutic activities;Patient/family education    PT Goals (Current goals can be found in the Care Plan section)  Acute Rehab PT Goals Patient Stated Goal: to get stronger PT Goal Formulation: With patient Time For Goal Achievement: 07/05/23 Potential to Achieve Goals: Good    Frequency Min 1X/week     Co-evaluation               AM-PAC PT "6 Clicks" Mobility  Outcome Measure Help needed turning from your back to your side while in a flat bed without using bedrails?: A Lot Help needed moving from lying on your back to sitting on the side of a flat bed without using bedrails?: A Lot Help needed moving to and from a bed to a chair (including a wheelchair)?: A Lot Help needed standing up from a chair using your arms (e.g., wheelchair or bedside chair)?: A Lot Help needed to walk in hospital room?: A Little Help needed climbing 3-5  steps with a railing? : Total 6 Click Score: 12    End of Session Equipment Utilized During Treatment: Gait belt Activity Tolerance: Patient tolerated treatment well Patient left: in chair;with chair alarm set;with call bell/phone within reach Nurse Communication: Mobility status PT Visit Diagnosis: History of falling (Z91.81);Difficulty in walking, not elsewhere classified (R26.2);Other abnormalities of gait and mobility (R26.89)    Time: 1355-1415 PT Time Calculation (min) (ACUTE ONLY): 20 min   Charges:   PT Evaluation $PT Eval Moderate Complexity: 1 Mod   PT General Charges $$ ACUTE PT VISIT: 1 Visit         Tamala Ser PT 06/21/2023  Acute Rehabilitation Services  Office [phone]

## 2023-06-21 NOTE — TOC Initial Note (Signed)
Transition of Care [facility]) - Initial/Assessment Note    Patient Details  Name: Adam Tapia MRN: [id number] Date of Birth: 12-12-1922  Transition of Care [facility]) CM/SW Contact:    Otelia Santee, LCSW Phone Number: 06/21/2023, 3:19 PM  Clinical Narrative:                 Met with pt at bedside who shares he currently resides with his wife and daughter. He typically uses a cane when ambulating but, also reports having a RW at his house. Pt has been to SNF in the past however, pt is declining to go to SNF at this time. Pt prefers to return home with family and is agreeable to having home health arranged.  TOC will work to arrange [facility] for pt.    Expected Discharge Plan: Skilled Nursing Facility Barriers to Discharge: No Barriers Identified   Patient Goals and CMS Choice Patient states their goals for this hospitalization and ongoing recovery are:: To go home CMS Medicare.gov Compare Post Acute Care list provided to:: Patient Choice offered to / list presented to : Patient [place] ownership interest in [facility].provided to:: Patient    Expected Discharge Plan and Services In-house Referral: Clinical Social Work Discharge Planning Services: NA Post Acute Care Choice: Home Health Living arrangements for the past 2 months: Single Family Home                 DME Arranged: N/A DME Agency: NA                  Prior Living Arrangements/Services Living arrangements for the past 2 months: Single Family Home Lives with:: Adult Children, Spouse Patient language and need for interpreter reviewed:: Yes Do you feel safe going back to the place where you live?: Yes      Need for Family Participation in Patient Care: No (Comment) Care giver support system in place?: No (comment) Current home services: DME (Cane, Rw) Criminal Activity/Legal Involvement Pertinent to Current Situation/Hospitalization: No - Comment as needed  Activities of Daily Living Home Assistive  Devices/Equipment: None ADL Screening (condition at time of admission) Patient's cognitive ability adequate to safely complete daily activities?: Yes Is the patient deaf or have difficulty hearing?: No Does the patient have difficulty seeing, even when wearing glasses/contacts?: No Does the patient have difficulty concentrating, remembering, or making decisions?: No Patient able to express need for assistance with ADLs?: Yes Does the patient have difficulty dressing or bathing?: No Independently performs ADLs?: Yes (appropriate for developmental age) Does the patient have difficulty walking or climbing stairs?: No Weakness of Legs: None Weakness of Arms/Hands: None  Permission Sought/Granted Permission sought to share information with : Family Supports Permission granted to share information with : Yes, Verbal Permission Granted              Emotional Assessment Appearance:: Appears younger than stated age Attitude/Demeanor/Rapport: Engaged Affect (typically observed): Pleasant Orientation: : Oriented to Self, Oriented to Place, Oriented to  Time, Oriented to Situation Alcohol / Substance Use: Not Applicable Psych Involvement: No (comment)  Admission diagnosis:  Acute hyponatremia [E87.1] CAP (community acquired pneumonia) [J18.9] Community acquired pneumonia, unspecified laterality [J18.9] Patient Active Problem List   Diagnosis Date Noted   CAP (community acquired pneumonia) 06/20/2023   Renal insufficiency 01/17/2023   Hypocalcemia 01/17/2023   Essential hypertension 01/17/2023   Macrocytic anemia 01/17/2023   Osteoarthritis of left hip 03/11/2016   Status post total replacement of right hip 03/11/2016  Myeloproliferative disorder (HCC) 08/27/2014   Leukocytosis 06/03/2013   PCP:  Fleet Contras, MD Pharmacy:   [facility] [phone] [place], [place] - [address] [place] [address] Phone: [phone] Fax:  [phone]     Social Determinants of Health (SDOH) Social History: SDOH Screenings   Food Insecurity: No Food Insecurity (06/20/2023)  Housing: Low Risk  (06/20/2023)  Transportation Needs: No Transportation Needs (06/20/2023)  Utilities: Not At Risk (06/20/2023)  Tobacco Use: Low Risk  (06/20/2023)   SDOH Interventions: Food Insecurity Interventions: Intervention Not Indicated Housing Interventions: Intervention Not Indicated Transportation Interventions: Intervention Not Indicated Utilities Interventions: Intervention Not Indicated   Readmission Risk Interventions    06/21/2023    3:16 PM  Readmission Risk Prevention Plan  Post Dischage Appt Complete  Medication Screening Complete  Transportation Screening Complete

## 2023-06-21 NOTE — Evaluation (Signed)
Clinical/Bedside Swallow Evaluation Patient Details  Name: Adam Tapia MRN: [id number] Date of Birth: 09/07/1923  Today's Date: 06/21/2023 Time: SLP Start Time (ACUTE ONLY): 1445 SLP Stop Time (ACUTE ONLY): 1503 SLP Time Calculation (min) (ACUTE ONLY): 18 min  Past Medical History:  Past Medical History:  Diagnosis Date   Acute blood loss anemia    AF (atrial fibrillation) (HCC)    Allergic rhinitis    Anaphylactic reaction 01/17/2023   Anemia    Arthritis    BPH (benign prostatic hyperplasia)    Constipation    Dysrhythmia    PT STATES HAS HX A FIB - FOLLOWED BY DR.KEVIN LITTLE   GERD (gastroesophageal reflux disease)    OCCASIONAL - HAS NEXIUM IF NEEDED - NOT CURRENTLY TAKING   Hypertension    Myeloproliferative disorder (HCC)    [facility] Ar[facility]us [facility]   Nocturia    Primary osteoarthritis of left hip    Psoriasis    Unsteady gait    Past Surgical History:  Past Surgical History:  Procedure Laterality Date   HERNIA REPAIR  1998   TOTAL HIP ARTHROPLASTY Left 03/11/2016   Procedure: LEFT TOTAL HIP ARTHROPLASTY ANTERIOR APPROACH;  Surgeon: Kathryne Hitch, MD;  Location: [facility];  Service: Orthopedics;  Laterality: Left;  Went from Spinal to an LMA   HPI:  Adam Tapia is a 89 y.o. male with medical history significant of   Atrial fibrillation, Hypertension,Myeloproliferative disorder, GERD, constipation ,unsteady gait ,psoriasis,CKDIIIb-IV who presents to ED BIBEMS  with complaint of multiple falls over the last 2 days. Patient has per family been weaker over the last few days with recurrent falls.  Patient per family had fall yesterday that was mechanical with  head strike resulting in abrasion on left side of his nose. Of note family states patient has had increase stools x 1 month and is currently undergoing evaluation by pcp. In the field patient was noted to have temp of 101.7 for which he was given 1 gram of tylenol enroute. Patient currently  states she has been feeling weaker x 2 days. He notes his diarrhea has improved. He  notes no cough / fever/ chills/ chest  pain or sob but noted low appetite and generalized weakness; CXR on 7/30 indicated Mild opacity at the right mid and lower lung. Acute infiltrates  possible.   ST consulted for swallow evaluation.    Assessment / Plan / Recommendation  Clinical Impression  Pt seen for clinical swallowing evaluation with intake of various consistencies and normal oropharyngeal swallow observed for age with presbyphagia (aging swallow) potentially impacting cumulative swallow function with slight delay in the initiation of the swallow and generalized oral weakness/decreased po intake/satiety impacting swallow integrity/strength.  Pt observed to exhibit decreased respiratory status (dx of PNA), which affects breathing/swallowing reciprocity and this was discussed with pt/nursing staff regarding impact to swallowing.  No family available during evaluation.  Pt consumed thin via straw with smaller volumes, puree and soft solids without overt s/sx of aspiration occurring.  Pt denies dysphagia at baseline, but did state "my tongue felt thick" when asked about swallow function in general.  Recommend continue Regular/thin liquids with ST f/u briefly in acute setting for diet tolerance with a meal as fatigue may negatively impact swallow function.  Thank you for this consult. SLP Visit Diagnosis: Dysphagia, unspecified (R13.10)    Aspiration Risk  Mild aspiration risk    Diet Recommendation   Thin;Age appropriate regular  Medication Administration:  Whole meds with liquid    Other  Recommendations Oral Care Recommendations: Oral care BID;Staff/trained caregiver to provide oral care    Recommendations for follow up therapy are one component of a multi-disciplinary discharge planning process, led by the attending physician.  Recommendations may be updated based on patient status, additional functional  criteria and insurance authorization.  Follow up Recommendations No SLP follow up      Assistance Recommended at Discharge  TBD  Functional Status Assessment Patient has had a recent decline in their functional status and demonstrates the ability to make significant improvements in function in a reasonable and predictable amount of time.  Frequency and Duration min 1 x/week  1 week       Prognosis Prognosis for improved oropharyngeal function: Good      Swallow Study   General Date of Onset: 06/20/23 HPI: Adam Tapia is a 89 y.o. male with medical history significant of   Atrial fibrillation, Hypertension,Myeloproliferative disorder, GERD, constipation ,unsteady gait ,psoriasis,CKDIIIb-IV who presents to ED BIBEMS  with complaint of multiple falls over the last 2 days. Patient has per family been weaker over the last few days with recurrent falls.  Patient per family had fall yesterday that was mechanical with  head strike resulting in abrasion on left side of his nose. Of note family states patient has had increase stools x 1 month and is currently undergoing evaluation by pcp. In the field patient was noted to have temp of 101.7 for which he was given 1 gram of tylenol enroute. Patient currently states she has been feeling weaker x 2 days. He notes his diarrhea has improved. He  notes no cough / fever/ chills/ chest  pain or sob but noted low appetite and generalized weakness; CXR on 7/30 indicated Mild opacity at the right mid and lower lung. Acute infiltrates  possible.   ST consulted for swallow evaluation. Type of Study: Bedside Swallow Evaluation Previous Swallow Assessment: n/a Diet Prior to this Study: Regular;Thin liquids (Level 0) Temperature Spikes Noted: Yes Respiratory Status: Room air History of Recent Intubation: No Behavior/Cognition: Alert;Cooperative Oral Cavity Assessment: Within Functional Limits Oral Care Completed by SLP: Recent completion by staff Oral Cavity  - Dentition: Adequate natural dentition;Other (Comment) (implants) Vision: Functional for self-feeding Self-Feeding Abilities: Able to feed self Patient Positioning: Upright in chair Baseline Vocal Quality: Normal Volitional Cough: Strong Volitional Swallow: Able to elicit    Oral/Motor/Sensory Function Overall Oral Motor/Sensory Function: Generalized oral weakness   Ice Chips Ice chips: Not tested   Thin Liquid Thin Liquid: Within functional limits Presentation: Straw;Self Fed    Nectar Thick Nectar Thick Liquid: Not tested   Honey Thick Honey Thick Liquid: Not tested   Puree Puree: Within functional limits Presentation: Self Fed   Solid     Solid: Within functional limits Presentation: Self Fed      Pat Adaliah Hiegel,M.S., CCC-SLP 06/21/2023,3:05 PM

## 2023-06-21 NOTE — Plan of Care (Signed)

## 2023-06-21 NOTE — Progress Notes (Signed)
PROGRESS NOTE    Adam Tapia  ZOX:[id number] DOB: 1923/09/27 DOA: 06/20/2023 PCP: Fleet Contras, MD    Brief Narrative:  Adam Tapia is a 89 y.o. male with medical history significant of  Atrial fibrillation, Hypertension,Myeloproliferative disorder, GERD, constipation ,unsteady gait ,psoriasis,CKDIIIb-IV who presents to ED BIBEMS  with complaint of multiple falls over the last 2 days. Patient has per family been weaker over the last few days with recurrent falls.  Patient per family had fall yesterday that was mechanical with  head strike resulting in abrasion on left side of his nose. Of note family states patient has had increase stools x 1 month and is currently undergoing evaluation by pcp. In the field patient was noted to have temp of 101.7 for which he was given 1 gram of tylenol enroute. Patient currently states she has been feeling weaker x 2 days. He notes his diarrhea has improved.    Assessment and Plan: CAP -patient with increase Opacities on cxr unable to r/o infection  -azitho/rocephin -check pro calcitonin -possible aspiration noted right sided infiltrate  -pulmonary toilet  -urine ag, sputum, f/u on culture data  -SLP consult   Hyponatremia - urine na , serum osmo still pending -labs in AM   Myeloproliferative d/o /Anemia  - noted hgb now 10 from around 12 a few months ago  - no episodes of bleed  -presumed due to MDS  -labs in AM    CKD IIIb -at baseline  -hold IVF for now   GERD -ppi    Constipation  -resume bowel regimen    Atrial fib  - DO NOT SEE eliquis on his home less - rate controlled   Hypertension -stable    Unsteady gait with recent fall -walks with cane normally -pt/ot to evaluate       DVT prophylaxis: SCDs Start: 06/20/23 1759    Code Status: Full Code   Disposition Plan:  Level of care: Telemetry Status is: Inpatient Remains inpatient appropriate    Consultants:  none   Subjective: Lives with wife and  normally walks with cane-- had recent fall Feels better today then yesterday  Objective: Vitals:   06/21/23 0003 06/21/23 0100 06/21/23 0206 06/21/23 0521  BP: (!) 153/74  128/63 (!) 160/77  Pulse: 87  78 87  Resp: (!) 24  (!) 22 (!) 21  Temp: (!) 103.1 F (39.5 C) 99.8 F (37.7 C) 99.9 F (37.7 C) 100.1 F (37.8 C)  TempSrc: Oral Oral Oral Oral  SpO2: 92%  95% 99%  Weight:      Height:        Intake/Output Summary (Last 24 hours) at 06/21/2023 8119 Last data filed at 06/21/2023 0414 Gross per 24 hour  Intake 2048.89 ml  Output --  Net 2048.89 ml   Filed Weights   06/20/23 1248  Weight: 81.6 kg    Examination:   General: Appearance:     Overweight male in no acute distress     Lungs:     Diminished, no on O2, respirations unlabored  Heart:    Normal heart rate.    MS:   All extremities are intact.    Neurologic:   Awake, alert, pleasant and cooperative       Data Reviewed: I have personally reviewed following labs and imaging studies  CBC: Recent Labs  Lab 06/20/23 1410  WBC 6.4  NEUTROABS 4.7  HGB 10.1*  HCT 28.6*  MCV 123.3*  PLT 154   Basic Metabolic Panel:  Recent Labs  Lab 06/20/23 1410 06/20/23 2158 06/21/23 0057  NA 127* 126* 124*  K 3.6 3.5  --   CL 98 95*  --   CO2 18* 21*  --   GLUCOSE 122* 147*  --   BUN 22 19  --   CREATININE 1.63* 1.30*  --   CALCIUM 8.2* 8.6*  --   MG  --  1.7  --    GFR: Estimated Creatinine Clearance: 30.7 mL/min (A) (by C-G formula based on SCr of 1.3 mg/dL (H)). Liver Function Tests: Recent Labs  Lab 06/20/23 1410  AST 24  ALT 13  ALKPHOS 33*  BILITOT 0.6  PROT 6.9  ALBUMIN 2.7*   No results for input(s): "LIPASE", "AMYLASE" in the last 168 hours. No results for input(s): "AMMONIA" in the last 168 hours. Coagulation Profile: Recent Labs  Lab 06/20/23 1739  INR 1.2   Cardiac Enzymes: No results for input(s): "CKTOTAL", "CKMB", "CKMBINDEX", "TROPONINI" in the last 168 hours. BNP (last 3  results) No results for input(s): "PROBNP" in the last 8760 hours. HbA1C: No results for input(s): "HGBA1C" in the last 72 hours. CBG: No results for input(s): "GLUCAP" in the last 168 hours. Lipid Profile: No results for input(s): "CHOL", "HDL", "LDLCALC", "TRIG", "CHOLHDL", "LDLDIRECT" in the last 72 hours. Thyroid Function Tests: Recent Labs    06/20/23 2158  TSH 0.397   Anemia Panel: Recent Labs    06/21/23 0057  VITAMINB12 309  FOLATE 12.1  FERRITIN 495*  TIBC 186*  IRON 11*  RETICCTPCT 1.2   Sepsis Labs: Recent Labs  Lab 06/20/23 1754 06/20/23 2158 06/21/23 0057  LATICACIDVEN 2.5* 1.9 1.1    Recent Results (from the past 240 hour(s))  SARS Coronavirus 2 by RT PCR (hospital order, performed in [facility] hospital lab) *cepheid single result test* Anterior Nasal Swab     Status: None   Collection Time: 06/20/23  2:02 PM   Specimen: Anterior Nasal Swab  Result Value Ref Range Status   SARS Coronavirus 2 by RT PCR NEGATIVE NEGATIVE Final    Comment: (NOTE) SARS-CoV-2 target nucleic acids are NOT DETECTED.  The SARS-CoV-2 RNA is generally detectable in upper and lower respiratory specimens during the acute phase of infection. The lowest concentration of SARS-CoV-2 viral copies this assay can detect is 250 copies / mL. A negative result does not preclude SARS-CoV-2 infection and should not be used as the sole basis for treatment or other patient management decisions.  A negative result may occur with improper specimen collection / handling, submission of specimen other than nasopharyngeal swab, presence of viral mutation(s) within the areas targeted by this assay, and inadequate number of viral copies (<250 copies / mL). A negative result must be combined with clinical observations, patient history, and epidemiological information.  Fact Sheet for Patients:   RoadLapTop.co.za  Fact Sheet for Healthcare  Providers: http://kim-miller.com/  This test is not yet approved or  cleared by the [place] FDA and has been authorized for detection and/or diagnosis of SARS-CoV-2 by FDA under an Emergency Use Authorization (EUA).  This EUA will remain in effect (meaning this test can be used) for the duration of the COVID-19 declaration under Section 564(b)(1) of the Act, 21 U.S.C. section 360bbb-3(b)(1), unless the authorization is terminated or revoked sooner.  Performed at [facility], [address] [place], [place] [address]   Resp panel by RT-PCR (RSV, Flu A&B, Covid) Anterior Nasal Swab     Status: None  Collection Time: 06/20/23  2:02 PM   Specimen: Anterior Nasal Swab  Result Value Ref Range Status   SARS Coronavirus 2 by RT PCR NEGATIVE NEGATIVE Final    Comment: (NOTE) SARS-CoV-2 target nucleic acids are NOT DETECTED.  The SARS-CoV-2 RNA is generally detectable in upper respiratory specimens during the acute phase of infection. The lowest concentration of SARS-CoV-2 viral copies this assay can detect is 138 copies/mL. A negative result does not preclude SARS-Cov-2 infection and should not be used as the sole basis for treatment or other patient management decisions. A negative result may occur with  improper specimen collection/handling, submission of specimen other than nasopharyngeal swab, presence of viral mutation(s) within the areas targeted by this assay, and inadequate number of viral copies(<138 copies/mL). A negative result must be combined with clinical observations, patient history, and epidemiological information. The expected result is Negative.  Fact Sheet for Patients:  BloggerCourse.com  Fact Sheet for Healthcare Providers:  SeriousBroker.it  This test is no t yet approved or cleared by the [place] FDA and  has been authorized for detection and/or diagnosis of  SARS-CoV-2 by FDA under an Emergency Use Authorization (EUA). This EUA will remain  in effect (meaning this test can be used) for the duration of the COVID-19 declaration under Section 564(b)(1) of the Act, 21 U.S.C.section 360bbb-3(b)(1), unless the authorization is terminated  or revoked sooner.       Influenza A by PCR NEGATIVE NEGATIVE Final   Influenza B by PCR NEGATIVE NEGATIVE Final    Comment: (NOTE) The Xpert Xpress SARS-CoV-2/FLU/RSV plus assay is intended as an aid in the diagnosis of influenza from Nasopharyngeal swab specimens and should not be used as a sole basis for treatment. Nasal washings and aspirates are unacceptable for Xpert Xpress SARS-CoV-2/FLU/RSV testing.  Fact Sheet for Patients: BloggerCourse.com  Fact Sheet for Healthcare Providers: SeriousBroker.it  This test is not yet approved or cleared by the [place] FDA and has been authorized for detection and/or diagnosis of SARS-CoV-2 by FDA under an Emergency Use Authorization (EUA). This EUA will remain in effect (meaning this test can be used) for the duration of the COVID-19 declaration under Section 564(b)(1) of the Act, 21 U.S.C. section 360bbb-3(b)(1), unless the authorization is terminated or revoked.     Resp Syncytial Virus by PCR NEGATIVE NEGATIVE Final    Comment: (NOTE) Fact Sheet for Patients: BloggerCourse.com  Fact Sheet for Healthcare Providers: SeriousBroker.it  This test is not yet approved or cleared by the [place] FDA and has been authorized for detection and/or diagnosis of SARS-CoV-2 by FDA under an Emergency Use Authorization (EUA). This EUA will remain in effect (meaning this test can be used) for the duration of the COVID-19 declaration under Section 564(b)(1) of the Act, 21 U.S.C. section 360bbb-3(b)(1), unless the authorization is terminated  or revoked.  Performed at [facility], [address], [place], [place] [address]   Blood Culture (routine x 2)     Status: None (Preliminary result)   Collection Time: 06/20/23  6:34 PM   Specimen: BLOOD LEFT HAND  Result Value Ref Range Status   Specimen Description   Final    BLOOD LEFT HAND Performed at [facility], [address] [place], [place] [address]    Special Requests   Final    BOTTLES DRAWN AEROBIC AND ANAEROBIC Blood Culture adequate volume Performed at [facility], [address] [place], [place] [address]    Culture PENDING  Incomplete   Report Status PENDING  Incomplete         Radiology Studies: DG Chest Port 1 View  Result Date: 06/20/2023 CLINICAL DATA:  Pain after fall EXAM: PORTABLE CHEST 1 VIEW COMPARISON:  01/17/23 FINDINGS: Mild opacity at the right mid and lower lung. No pneumothorax, effusion or edema. Normal cardiopericardial silhouette. Overlapping cardiac leads. Film is under penetrated. IMPRESSION: Mild opacity at the right mid and lower lung. Acute infiltrates possible. Recommend follow-up to confirm clearance. Electronically Signed   By: Karen Kays M.D.   On: 06/20/2023 14:23   CT Head Wo Contrast  Result Date: 06/20/2023 CLINICAL DATA:  Polytrauma, blunt EXAM: CT HEAD WITHOUT CONTRAST CT CERVICAL SPINE WITHOUT CONTRAST TECHNIQUE: Multidetector CT imaging of the head and cervical spine was performed following the standard protocol without intravenous contrast. Multiplanar CT image reconstructions of the cervical spine were also generated. RADIATION DOSE REDUCTION: This exam was performed according to the departmental dose-optimization program which includes automated exposure control, adjustment of the mA and/or kV according to patient size and/or use of iterative reconstruction technique. COMPARISON:  None Available. FINDINGS: CT HEAD FINDINGS Brain: No evidence of acute infarction, hemorrhage,  hydrocephalus, extra-axial collection or mass lesion/mass effect. Sequela of mild chronic microvascular ischemic change. Generalized volume loss. Prominent subarachnoid spaces bilaterally are likely related to the degree of volume loss. Vascular: No hyperdense vessel or unexpected calcification. Skull: Normal. Negative for fracture or focal lesion. Sinuses/Orbits: No middle ear or mastoid effusion. Paranasal sinuses are notable for complete opacification of the left frontal sinus. Orbits are unremarkable. Other: None. CT CERVICAL SPINE FINDINGS Alignment: Trace retrolisthesis of C5 on C6. Grade 1 anterolisthesis of C7 on T1. Skull base and vertebrae: No acute fracture. No primary bone lesion or focal pathologic process. Soft tissues and spinal canal: No prevertebral fluid or swelling. No visible canal hematoma. Disc levels:  No evidence of high-grade spinal canal stenosis. Upper chest: Negative. Other: None IMPRESSION: 1. No acute intracranial abnormality. 2. No acute fracture or traumatic subluxation of the cervical spine. Electronically Signed   By: Lorenza Cambridge M.D.   On: 06/20/2023 14:15   CT Cervical Spine Wo Contrast  Result Date: 06/20/2023 CLINICAL DATA:  Polytrauma, blunt EXAM: CT HEAD WITHOUT CONTRAST CT CERVICAL SPINE WITHOUT CONTRAST TECHNIQUE: Multidetector CT imaging of the head and cervical spine was performed following the standard protocol without intravenous contrast. Multiplanar CT image reconstructions of the cervical spine were also generated. RADIATION DOSE REDUCTION: This exam was performed according to the departmental dose-optimization program which includes automated exposure control, adjustment of the mA and/or kV according to patient size and/or use of iterative reconstruction technique. COMPARISON:  None Available. FINDINGS: CT HEAD FINDINGS Brain: No evidence of acute infarction, hemorrhage, hydrocephalus, extra-axial collection or mass lesion/mass effect. Sequela of mild chronic  microvascular ischemic change. Generalized volume loss. Prominent subarachnoid spaces bilaterally are likely related to the degree of volume loss. Vascular: No hyperdense vessel or unexpected calcification. Skull: Normal. Negative for fracture or focal lesion. Sinuses/Orbits: No middle ear or mastoid effusion. Paranasal sinuses are notable for complete opacification of the left frontal sinus. Orbits are unremarkable. Other: None. CT CERVICAL SPINE FINDINGS Alignment: Trace retrolisthesis of C5 on C6. Grade 1 anterolisthesis of C7 on T1. Skull base and vertebrae: No acute fracture. No primary bone lesion or focal pathologic process. Soft tissues and spinal canal: No prevertebral fluid or swelling. No visible canal hematoma. Disc levels:  No evidence of high-grade spinal canal stenosis. Upper chest: Negative.  Other: None IMPRESSION: 1. No acute intracranial abnormality. 2. No acute fracture or traumatic subluxation of the cervical spine. Electronically Signed   By: Lorenza Cambridge M.D.   On: 06/20/2023 14:15        Scheduled Meds:  acetaminophen  650 mg Oral Once   amLODipine  5 mg Oral Daily   atenolol  25 mg Oral Daily   hydroxyurea  500 mg Oral BID   pantoprazole  40 mg Oral Daily   polyethylene glycol  17 g Oral Daily   Continuous Infusions:  sodium chloride 75 mL/hr at 06/21/23 0414   azithromycin     cefTRIAXone (ROCEPHIN)  IV       LOS: 1 day    Time spent: 45 minutes spent on chart review, discussion with nursing staff, consultants, updating family and interview/physical exam; more than 50% of that time was spent in counseling and/or coordination of care.    Joseph Art, DO [facility] Available via Epic secure chat 7am-7pm After these hours, please refer to coverage provider listed on [email or web address] 06/21/2023, 8:14 AM

## 2023-06-21 NOTE — Progress Notes (Signed)
Prior-To-Admission Oral Chemotherapy for Treatment of Oncologic Disease   Order noted from Dr. Maisie Fus to continue prior-to-admission oral chemotherapy regimen of hydroxyurea.  Procedure Per Pharmacy & Therapeutics Committee Policy: Orders for continuation of home oral chemotherapy for treatment of an oncologic disease will be held unless approved by an oncologist during current admission.    For patients receiving oncology care at [facility], inpatient pharmacist contacts patient's oncologist during regular office hours to review. If earlier review is medically necessary, attending physician consults [facility] on-call oncologist   For patients receiving oncology care outside of [facility], attending physician consults patient's oncologist to review. If this oncologist or their coverage cannot be reached, attending physician consults [facility] on-call oncologist   Per Dr. Arbutus Ped 7/31, okay to continue Hydrea at this time as long as current admission not related to/complicated by therapy.   Pricilla Riffle, PharmD, BCPS Clinical Pharmacist 06/21/2023 1:32 PM

## 2023-06-21 NOTE — Progress Notes (Signed)
   06/21/23 0003  Assess: MEWS Score  Temp (!) 103.1 F (39.5 C) (RN notified)   Patient has elevated temperature. Yellow MEWS score applied. Tylenol was given, Will recheck temperature in 30 minutes

## 2023-06-22 DIAGNOSIS — J189 Pneumonia, unspecified organism: Secondary | ICD-10-CM | POA: Diagnosis not present

## 2023-06-22 LAB — BASIC METABOLIC PANEL
Anion gap: 10 (ref 5–15)
BUN: 16 mg/dL (ref 8–23)
CO2: 20 mmol/L — ABNORMAL LOW (ref 22–32)
Calcium: 8.7 mg/dL — ABNORMAL LOW (ref 8.9–10.3)
Chloride: 94 mmol/L — ABNORMAL LOW (ref 98–111)
Creatinine, Ser: 1.36 mg/dL — ABNORMAL HIGH (ref 0.61–1.24)
GFR, Estimated: 47 mL/min — ABNORMAL LOW (ref >60)
Glucose, Bld: 117 mg/dL — ABNORMAL HIGH (ref 70–99)
Potassium: 3.8 mmol/L (ref 3.5–5.1)
Sodium: 124 mmol/L — ABNORMAL LOW (ref 135–145)

## 2023-06-22 LAB — SODIUM, URINE, RANDOM: Sodium, Ur: 10 mmol/L

## 2023-06-22 LAB — OSMOLALITY, URINE: Osmolality, Ur: 703 mOsm/kg (ref 300–900)

## 2023-06-22 MED ORDER — VITAMIN B-12 1000 MCG PO TABS
500.0000 ug | ORAL_TABLET | Freq: Every day | ORAL | Status: DC
  Administered 2023-06-22 – 2023-06-26 (×5): 500 ug via ORAL
  Filled 2023-06-22 (×5): qty 1

## 2023-06-22 MED ORDER — POTASSIUM CHLORIDE CRYS ER 20 MEQ PO TBCR
40.0000 meq | EXTENDED_RELEASE_TABLET | Freq: Once | ORAL | Status: AC
  Administered 2023-06-22: 40 meq via ORAL
  Filled 2023-06-22: qty 2

## 2023-06-22 NOTE — TOC Progression Note (Signed)
Transition of Care [facility]) - Progression Note    Patient Details  Name: Adam Tapia MRN: [id number] Date of Birth: [birth year]  Transition of Care [facility] CM/SW Contact  Otelia Santee, LCSW Phone Number: 06/22/2023, 9:06 AM  Clinical Narrative:    HHPT has been arranged with [facility]. HH order will need to be placed prior to discharge.    Expected Discharge Plan: Skilled Nursing Facility Barriers to Discharge: No Barriers Identified  Expected Discharge Plan and Services In-house Referral: Clinical Social Work Discharge Planning Services: NA Post Acute Care Choice: Home Health Living arrangements for the past 2 months: Single Family Home                 DME Arranged: N/A DME Agency: NA       HH Arranged: PT HH Agency: Presenter, broadcasting, [facility] Date HH Agency Contacted: 06/22/23 Time HH Agency Contacted: [phone] Representative spoke with at [facility] Agency: Tresa Endo   Social Determinants of Health (SDOH) Interventions SDOH Screenings   Food Insecurity: No Food Insecurity (06/20/2023)  Housing: Low Risk  (06/20/2023)  Transportation Needs: No Transportation Needs (06/20/2023)  Utilities: Not At Risk (06/20/2023)  Tobacco Use: Low Risk  (06/20/2023)    Readmission Risk Interventions    06/21/2023    3:16 PM  Readmission Risk Prevention Plan  Post Dischage Appt Complete  Medication Screening Complete  Transportation Screening Complete

## 2023-06-22 NOTE — Progress Notes (Signed)
PROGRESS NOTE Adam Tapia  YQM:[id number] DOB: 07/21/1923 DOA: 06/20/2023 PCP: Fleet Contras, MD  Brief Narrative/Hospital Course:  89 y.o. male with medical history significant of Atrial fibrillation, Hypertension,Myeloproliferative disorder, GERD, constipation ,unsteady gait ,psoriasis,CKDIIIb-IV who presented to ED BIBEMS  with complaint of multiple falls over the last 2 days PTA. Patient has per family been weaker over the last few days with recurrent falls.  He had a  mechanical with  head strike resulting in abrasion on left side of his nose per family.Of note family states patient has had increase stools x 1 month and is currently undergoing evaluation by pcp.In the field patient was noted to have temp of 101.7 for which he was given 1 gram of tylenol enroute.  7/30 in the ED: Vitals are stable low-grade fever Labs showed> hyponatremia 127 creatinine 1.6, lactic acidosis 2.5> subsequently resolved to 1.1, hemoglobin 10.1, influenza RSV COVID-negative CT head and C SPINE> no acute finding or fracture Chest x-ray> mild opacity in the right mid and lower lung acute infiltrates possible UA unremarkable Blood culture sent Patient was admitted for pneumonia    Subjective: Patient was seen and examined earlier Resting on bedside chair no nausea vomiting eating diet, no new complaints Tmax 103.1 on admission yesterday, saturating well on room air BP stable Labs shows sodium further downtrending/fluctuating 126>124>128>123, potassium 3.2 creatinine  1.6>1.3>1.2>1.2, CBC with mild thrombocytopenia and anemia 10.2  Assessment and Plan: Principal Problem:   CAP (community acquired pneumonia)   Community-acquired pneumonia: Imaging showed opacities on chest x-ray, although hemodynamically stable glucose is stable had fever at home> bilateral is unremarkable stimulant antigen negative blood culture NGTD.  Initial lactic acidosis resolved, Pro-Cal 0.8 Continue with current empiric antibiotics,  pulmonary toileting speech follow-up aspiration precaution   Hyponatremia: Initially presumed to be hypovolemic given trial of IV fluids, however sodium fluctuating and downtrending.?  SIADH -TSH 0.3, urine osmole elevated 624 and sodium 46. Currently off ivf.nephrology consulted recently admitted lites checking BMP continue fluid restriction electrolytes replacement. At this time 3% saline is not indicated.  Recent Labs  Lab 06/20/23 1410 06/20/23 2158 06/21/23 0057 06/21/23 0806 06/22/23 0519  NA 127* 126* 124* 128* 123*    Myeloproliferative d/o  Anemia: Anemia panel reviewed iron low at 11 ferritin 495 B12 on lower side 319 folate normal.  Add B12 supplement watch for any bleeding.presumed due to MDS.  Transfuse if less than 7 g.  Continue Hydrea Recent Labs  Lab 06/20/23 1410 06/21/23 0806 06/22/23 0519  HGB 10.1* 12.1* 10.2*  HCT 28.6* 34.0* 28.8*      CKDIIIb-IV: Creatinine seems to be downtrending Recent Labs    06/27/22 1015 09/26/22 1023 12/26/22 1313 01/17/23 0219 02/02/23 1146 03/27/23 1442 06/20/23 1410 06/20/23 2158 06/21/23 0806 06/22/23 0519  BUN 33* 31* 33* 36* 46* 37* 22 19 15 15   CREATININE 1.52* 1.45* 1.56* 1.81* 2.06* 1.76* 1.63* 1.30* 1.21 1.26*  CO2 24 27 25  19* 23 22 18* 21* 19* 20*    Hypokalemia: will replace k Recent Labs  Lab 06/20/23 1410 06/20/23 2158 06/21/23 0806 06/22/23 0519  K 3.6 3.5 3.5 3.2*  CALCIUM 8.2* 8.6* 8.8* 8.6*  MG  --  1.7  --   --    GERD Cont ppi    Constipation  Cont bowel regimen .  Last BM 7/31   Atrial fib:  Rate controlled on atenolol.  At home on Eliquis- patient with falls and is 89>? Benefit of Eliquis at this time vs risk-related to  discussed with his primary cardiology team regarding further continuation of Eliquis before discharge  Hypertension BP stable. Cont amlodpine and atenolol   Unsteady gait-chronic Multiple falls: Continue PT OT, supportive care fall precaution.  Uses cane normally  at home.   DVT prophylaxis: SCDs Start: 06/20/23 1759 Code Status:   Code Status: Full Code Family Communication: plan of care discussed with patient at bedside. Patient status is: Inpatient because of ongoing hyponatremia Level of care: Telemetry   Dispo: The patient is from: home            Anticipated disposition: SNF has been recommended by PT OT but patient seems reluctant reports he wants to go home with wife Objective: Vitals last 24 hrs: Vitals:   06/21/23 1032 06/21/23 1353 06/21/23 1910 06/22/23 0534  BP: (!) 158/88 132/70 (!) 149/77 134/71  Pulse: 81 76 79 69  Resp: 18 18 18 16   Temp: 98.2 F (36.8 C) 99.6 F (37.6 C) 99.3 F (37.4 C) 98.8 F (37.1 C)  TempSrc: Oral Oral    SpO2: 93% 97% 96% 97%  Weight:      Height:       Weight change:   Physical Examination: General exam: alert awake, older than stated age HEENT:Oral mucosa moist, Ear/Nose WNL grossly Respiratory system: bilaterally clear BS, no use of accessory muscle Cardiovascular system: S1 & S2 +, No JVD. Gastrointestinal system: Abdomen soft,NT,ND, BS+ Nervous System:Alert, awake, moving extremities. Extremities: LE edema neg,distal peripheral pulses palpable.  Skin: No rashes,no icterus. MSK: Normal muscle bulk,tone, power  Medications reviewed:  Scheduled Meds:  acetaminophen  650 mg Oral Once   acidophilus  2 capsule Oral Daily   amLODipine  5 mg Oral Daily   atenolol  25 mg Oral Daily   vitamin B-12  500 mcg Oral Daily   hydroxyurea  500 mg Oral BID   pantoprazole  40 mg Oral Daily   polyethylene glycol  17 g Oral Daily   Continuous Infusions:  azithromycin Stopped (06/21/23 1842)   cefTRIAXone (ROCEPHIN)  IV Stopped (06/21/23 1738)      Diet Order             Diet Heart Room service appropriate? Yes; Fluid consistency: Thin; Fluid restriction: 1500 mL Fluid  Diet effective now                            Intake/Output Summary (Last 24 hours) at 06/22/2023 1342 Last data  filed at 06/22/2023 1125 Gross per 24 hour  Intake 118 ml  Output 750 ml  Net -632 ml   Net IO Since Admission: 1,306.89 mL [06/22/23 1342]  Wt Readings from Last 3 Encounters:  06/20/23 81.6 kg  03/27/23 84.5 kg  01/17/23 86.1 kg     Unresulted Labs (From admission, onward)     Start     Ordered   06/23/23 0500  Basic metabolic panel  Daily,   R     Question:  Specimen collection method  Answer:  Lab=Lab collect   06/22/23 0848   06/23/23 0500  CBC  Daily,   R     Question:  Specimen collection method  Answer:  Lab=Lab collect   06/22/23 0848   06/22/23 1400  Basic metabolic panel  Once,   R       Question:  Specimen collection method  Answer:  Lab=Lab collect   06/22/23 1055   06/22/23 1056  Osmolality, urine  Once,  R        06/22/23 1055   06/22/23 1056  Sodium, urine, random  Once,   R        06/22/23 1055   06/20/23 1800  Expectorated Sputum Assessment w Gram Stain, Rflx to Resp Cult  Once,   R        06/20/23 1801   06/20/23 1751  Expectorated Sputum Assessment w Gram Stain, Rflx to Resp Cult  (COPD / Pneumonia / Cellulitis / Lower Extremity Wound)  Once,   R        06/20/23 1801   06/20/23 1751  Legionella Pneumophila Serogp 1 Ur Ag  (COPD / Pneumonia / Cellulitis / Lower Extremity Wound)  Once,   R        06/20/23 1801          Data Reviewed: I have personally reviewed following labs and imaging studies CBC: Recent Labs  Lab 06/20/23 1410 06/21/23 0806 06/22/23 0519  WBC 6.4 5.9 4.0  NEUTROABS 4.7  --   --   HGB 10.1* 12.1* 10.2*  HCT 28.6* 34.0* 28.8*  MCV 123.3* 119.3* 124.1*  PLT 154 169 145*   Basic Metabolic Panel: Recent Labs  Lab 06/20/23 1410 06/20/23 2158 06/21/23 0057 06/21/23 0806 06/22/23 0519  NA 127* 126* 124* 128* 123*  K 3.6 3.5  --  3.5 3.2*  CL 98 95*  --  96* 94*  CO2 18* 21*  --  19* 20*  GLUCOSE 122* 147*  --  105* 107*  BUN 22 19  --  15 15  CREATININE 1.63* 1.30*  --  1.21 1.26*  CALCIUM 8.2* 8.6*  --  8.8* 8.6*  MG   --  1.7  --   --   --    GFR: Estimated Creatinine Clearance: 31.7 mL/min (A) (by C-G formula based on SCr of 1.26 mg/dL (H)). Liver Function Tests: Recent Labs  Lab 06/20/23 1410 06/21/23 0806  AST 24 39  ALT 13 18  ALKPHOS 33* 36*  BILITOT 0.6 0.4  PROT 6.9 7.4  ALBUMIN 2.7* 2.7*   No results for input(s): "LIPASE", "AMYLASE" in the last 168 hours. No results for input(s): "AMMONIA" in the last 168 hours. Coagulation Profile: Recent Labs  Lab 06/20/23 1739  INR 1.2   BNP (last 3 results) No results for input(s): "PROBNP" in the last 8760 hours. HbA1C: Recent Labs    06/20/23 2158  HGBA1C 5.9*   CBG: No results for input(s): "GLUCAP" in the last 168 hours. Lipid Profile: No results for input(s): "CHOL", "HDL", "LDLCALC", "TRIG", "CHOLHDL", "LDLDIRECT" in the last 72 hours. Thyroid Function Tests: Recent Labs    06/20/23 2158  TSH 0.397   Sepsis Labs: Recent Labs  Lab 06/20/23 1754 06/20/23 2158 06/21/23 0057 06/22/23 0519  PROCALCITON  --   --   --  0.85  LATICACIDVEN 2.5* 1.9 1.1  --     Recent Results (from the past 240 hour(s))  SARS Coronavirus 2 by RT PCR (hospital order, performed in [facility] hospital lab) *cepheid single result test* Anterior Nasal Swab     Status: None   Collection Time: 06/20/23  2:02 PM   Specimen: Anterior Nasal Swab  Result Value Ref Range Status   SARS Coronavirus 2 by RT PCR NEGATIVE NEGATIVE Final    Comment: (NOTE) SARS-CoV-2 target nucleic acids are NOT DETECTED.  The SARS-CoV-2 RNA is generally detectable in upper and lower respiratory specimens during the acute phase of infection. The  lowest concentration of SARS-CoV-2 viral copies this assay can detect is 250 copies / mL. A negative result does not preclude SARS-CoV-2 infection and should not be used as the sole basis for treatment or other patient management decisions.  A negative result may occur with improper specimen collection / handling, submission of  specimen other than nasopharyngeal swab, presence of viral mutation(s) within the areas targeted by this assay, and inadequate number of viral copies (<250 copies / mL). A negative result must be combined with clinical observations, patient history, and epidemiological information.  Fact Sheet for Patients:   RoadLapTop.co.za  Fact Sheet for Healthcare Providers: http://kim-miller.com/  This test is not yet approved or  cleared by the [place] FDA and has been authorized for detection and/or diagnosis of SARS-CoV-2 by FDA under an Emergency Use Authorization (EUA).  This EUA will remain in effect (meaning this test can be used) for the duration of the COVID-19 declaration under Section 564(b)(1) of the Act, 21 U.S.C. section 360bbb-3(b)(1), unless the authorization is terminated or revoked sooner.  Performed at [facility], [address], [place], [place] [address]   Resp panel by RT-PCR (RSV, Flu A&B, Covid) Anterior Nasal Swab     Status: None   Collection Time: 06/20/23  2:02 PM   Specimen: Anterior Nasal Swab  Result Value Ref Range Status   SARS Coronavirus 2 by RT PCR NEGATIVE NEGATIVE Final    Comment: (NOTE) SARS-CoV-2 target nucleic acids are NOT DETECTED.  The SARS-CoV-2 RNA is generally detectable in upper respiratory specimens during the acute phase of infection. The lowest concentration of SARS-CoV-2 viral copies this assay can detect is 138 copies/mL. A negative result does not preclude SARS-Cov-2 infection and should not be used as the sole basis for treatment or other patient management decisions. A negative result may occur with  improper specimen collection/handling, submission of specimen other than nasopharyngeal swab, presence of viral mutation(s) within the areas targeted by this assay, and inadequate number of viral copies(<138 copies/mL). A negative result must be combined  with clinical observations, patient history, and epidemiological information. The expected result is Negative.  Fact Sheet for Patients:  BloggerCourse.com  Fact Sheet for Healthcare Providers:  SeriousBroker.it  This test is no t yet approved or cleared by the [place] FDA and  has been authorized for detection and/or diagnosis of SARS-CoV-2 by FDA under an Emergency Use Authorization (EUA). This EUA will remain  in effect (meaning this test can be used) for the duration of the COVID-19 declaration under Section 564(b)(1) of the Act, 21 U.S.C.section 360bbb-3(b)(1), unless the authorization is terminated  or revoked sooner.       Influenza A by PCR NEGATIVE NEGATIVE Final   Influenza B by PCR NEGATIVE NEGATIVE Final    Comment: (NOTE) The Xpert Xpress SARS-CoV-2/FLU/RSV plus assay is intended as an aid in the diagnosis of influenza from Nasopharyngeal swab specimens and should not be used as a sole basis for treatment. Nasal washings and aspirates are unacceptable for Xpert Xpress SARS-CoV-2/FLU/RSV testing.  Fact Sheet for Patients: BloggerCourse.com  Fact Sheet for Healthcare Providers: SeriousBroker.it  This test is not yet approved or cleared by the [place] FDA and has been authorized for detection and/or diagnosis of SARS-CoV-2 by FDA under an Emergency Use Authorization (EUA). This EUA will remain in effect (meaning this test can be used) for the duration of the COVID-19 declaration under Section 564(b)(1) of the Act, 21 U.S.C. section 360bbb-3(b)(1), unless the authorization is  terminated or revoked.     Resp Syncytial Virus by PCR NEGATIVE NEGATIVE Final    Comment: (NOTE) Fact Sheet for Patients: BloggerCourse.com  Fact Sheet for Healthcare Providers: SeriousBroker.it  This test is not yet approved  or cleared by the [place] FDA and has been authorized for detection and/or diagnosis of SARS-CoV-2 by FDA under an Emergency Use Authorization (EUA). This EUA will remain in effect (meaning this test can be used) for the duration of the COVID-19 declaration under Section 564(b)(1) of the Act, 21 U.S.C. section 360bbb-3(b)(1), unless the authorization is terminated or revoked.  Performed at [facility], [address] [place], [place] [address]   Blood Culture (routine x 2)     Status: None (Preliminary result)   Collection Time: 06/20/23  5:39 PM   Specimen: Right Antecubital; Blood  Result Value Ref Range Status   Specimen Description   Final    RIGHT ANTECUBITAL Performed at [facility], [address], [place], [place] [address]    Special Requests   Final    BOTTLES DRAWN AEROBIC AND ANAEROBIC Blood Culture results may not be optimal due to an inadequate volume of blood received in culture bottles Performed at [facility], [address] [place], [place] [address]    Culture   Final    NO GROWTH 2 DAYS Performed at [facility], [address], [place], [place] [address]    Report Status PENDING  Incomplete  Blood Culture (routine x 2)     Status: None (Preliminary result)   Collection Time: 06/20/23  6:34 PM   Specimen: BLOOD LEFT HAND  Result Value Ref Range Status   Specimen Description   Final    BLOOD LEFT HAND Performed at [facility], [address] [place], [place] [address]    Special Requests   Final    BOTTLES DRAWN AEROBIC AND ANAEROBIC Blood Culture adequate volume Performed at [facility], [address], [place], [place] [address]    Culture   Final    NO GROWTH 2 DAYS Performed at [facility], [address], [place], [place] [address]    Report Status PENDING  Incomplete  Culture, blood (routine x 2) Call MD if unable to obtain prior to  antibiotics being given     Status: None (Preliminary result)   Collection Time: 06/20/23  9:58 PM   Specimen: BLOOD LEFT HAND  Result Value Ref Range Status   Specimen Description   Final    BLOOD LEFT HAND Performed at [facility], [address] [place], [place] [address]    Special Requests   Final    BOTTLES DRAWN AEROBIC ONLY Blood Culture adequate volume Performed at [facility], [address], [place], [place] [address]    Culture   Final    NO GROWTH 1 DAY Performed at [facility], [address] [place], [place] [address]    Report Status PENDING  Incomplete    Antimicrobials: Anti-infectives (From admission, onward)    Start     Dose/Rate Route Frequency Ordered Stop   06/21/23 1800  cefTRIAXone (ROCEPHIN) 2 g in sodium chloride 0.9 % 100 mL IVPB        2 g 200 mL/hr over 30 Minutes Intravenous Every 24 hours 06/20/23 1801 06/25/23 1759   06/21/23 1800  azithromycin (ZITHROMAX) 500 mg in sodium chloride 0.9 % 250 mL  IVPB        500 mg 250 mL/hr over 60 Minutes Intravenous Every 24 hours 06/20/23 1801 06/25/23 1759   06/20/23 1630  cefTRIAXone (ROCEPHIN) 1 g in sodium chloride 0.9 % 100 mL IVPB        1 g 200 mL/hr over 30 Minutes Intravenous  Once 06/20/23 1622 06/20/23 1840   06/20/23 1630  azithromycin (ZITHROMAX) 500 mg in sodium chloride 0.9 % 250 mL IVPB        500 mg 250 mL/hr over 60 Minutes Intravenous  Once 06/20/23 1622 06/20/23 2000      Culture/Microbiology    Component Value Date/Time   SDES  06/20/2023 2158    BLOOD LEFT HAND Performed at [facility], [address], [place], [place] [address]    SPECREQUEST  06/20/2023 2158    BOTTLES DRAWN AEROBIC ONLY Blood Culture adequate volume Performed at [facility], [address] [place], [place] [address]    CULT  06/20/2023 2158    NO GROWTH 1 DAY Performed at [facility], [address], [place], [place] [address]     REPTSTATUS PENDING 06/20/2023 2158    Other culture-see note  Radiology Studies: DG Chest Port 1 View  Result Date: 06/20/2023 CLINICAL DATA:  Pain after fall EXAM: PORTABLE CHEST 1 VIEW COMPARISON:  01/17/23 FINDINGS: Mild opacity at the right mid and lower lung. No pneumothorax, effusion or edema. Normal cardiopericardial silhouette. Overlapping cardiac leads. Film is under penetrated. IMPRESSION: Mild opacity at the right mid and lower lung. Acute infiltrates possible. Recommend follow-up to confirm clearance. Electronically Signed   By: Karen Kays M.D.   On: 06/20/2023 14:23   CT Head Wo Contrast  Result Date: 06/20/2023 CLINICAL DATA:  Polytrauma, blunt EXAM: CT HEAD WITHOUT CONTRAST CT CERVICAL SPINE WITHOUT CONTRAST TECHNIQUE: Multidetector CT imaging of the head and cervical spine was performed following the standard protocol without intravenous contrast. Multiplanar CT image reconstructions of the cervical spine were also generated. RADIATION DOSE REDUCTION: This exam was performed according to the departmental dose-optimization program which includes automated exposure control, adjustment of the mA and/or kV according to patient size and/or use of iterative reconstruction technique. COMPARISON:  None Available. FINDINGS: CT HEAD FINDINGS Brain: No evidence of acute infarction, hemorrhage, hydrocephalus, extra-axial collection or mass lesion/mass effect. Sequela of mild chronic microvascular ischemic change. Generalized volume loss. Prominent subarachnoid spaces bilaterally are likely related to the degree of volume loss. Vascular: No hyperdense vessel or unexpected calcification. Skull: Normal. Negative for fracture or focal lesion. Sinuses/Orbits: No middle ear or mastoid effusion. Paranasal sinuses are notable for complete opacification of the left frontal sinus. Orbits are unremarkable. Other: None. CT CERVICAL SPINE FINDINGS Alignment: Trace retrolisthesis of C5 on C6. Grade 1  anterolisthesis of C7 on T1. Skull base and vertebrae: No acute fracture. No primary bone lesion or focal pathologic process. Soft tissues and spinal canal: No prevertebral fluid or swelling. No visible canal hematoma. Disc levels:  No evidence of high-grade spinal canal stenosis. Upper chest: Negative. Other: None IMPRESSION: 1. No acute intracranial abnormality. 2. No acute fracture or traumatic subluxation of the cervical spine. Electronically Signed   By: Lorenza Cambridge M.D.   On: 06/20/2023 14:15   CT Cervical Spine Wo Contrast  Result Date: 06/20/2023 CLINICAL DATA:  Polytrauma, blunt EXAM: CT HEAD WITHOUT CONTRAST CT CERVICAL SPINE WITHOUT CONTRAST TECHNIQUE: Multidetector CT imaging of the head and cervical spine was performed following the standard protocol  without intravenous contrast. Multiplanar CT image reconstructions of the cervical spine were also generated. RADIATION DOSE REDUCTION: This exam was performed according to the departmental dose-optimization program which includes automated exposure control, adjustment of the mA and/or kV according to patient size and/or use of iterative reconstruction technique. COMPARISON:  None Available. FINDINGS: CT HEAD FINDINGS Brain: No evidence of acute infarction, hemorrhage, hydrocephalus, extra-axial collection or mass lesion/mass effect. Sequela of mild chronic microvascular ischemic change. Generalized volume loss. Prominent subarachnoid spaces bilaterally are likely related to the degree of volume loss. Vascular: No hyperdense vessel or unexpected calcification. Skull: Normal. Negative for fracture or focal lesion. Sinuses/Orbits: No middle ear or mastoid effusion. Paranasal sinuses are notable for complete opacification of the left frontal sinus. Orbits are unremarkable. Other: None. CT CERVICAL SPINE FINDINGS Alignment: Trace retrolisthesis of C5 on C6. Grade 1 anterolisthesis of C7 on T1. Skull base and vertebrae: No acute fracture. No primary bone  lesion or focal pathologic process. Soft tissues and spinal canal: No prevertebral fluid or swelling. No visible canal hematoma. Disc levels:  No evidence of high-grade spinal canal stenosis. Upper chest: Negative. Other: None IMPRESSION: 1. No acute intracranial abnormality. 2. No acute fracture or traumatic subluxation of the cervical spine. Electronically Signed   By: Lorenza Cambridge M.D.   On: 06/20/2023 14:15     LOS: 2 days   Lanae Boast, MD [facility]  06/22/2023, 1:42 PM

## 2023-06-22 NOTE — TOC Progression Note (Signed)
Transition of Care [facility]) - Progression Note    Patient Details  Name: Adam Tapia MRN: [id number] Date of Birth: [birth year]  Transition of Care [facility]) CM/SW Contact  Huston Foley Jacklynn Ganong, RN Phone Number: 06/22/2023, 10:07 AM  Clinical Narrative:    LATE ENTRY : CM spoke with patient and wife on 7/31 3:40pm, discussed his wish to go home versus SNF. Choice offered for [facility] agency, wife has no preference. CM called referral to Rocco Pauls with [facility]. Patient's wife states she and her daughter will be assisting patient as needed.    Expected Discharge Plan: Skilled Nursing Facility Barriers to Discharge: No Barriers Identified  Expected Discharge Plan and Services In-house Referral: Clinical Social Work Discharge Planning Services: NA Post Acute Care Choice: Home Health Living arrangements for the past 2 months: Single Family Home                 DME Arranged: N/A DME Agency: NA       HH Arranged: PCS/Personal Care Services HH Agency: [facility], [facility] Date HH Agency Contacted: 06/22/23 Time HH Agency Contacted: [id number] Representative spoke with at [facility] Agency: Tresa Endo   Social Determinants of Health (SDOH) Interventions SDOH Screenings   Food Insecurity: No Food Insecurity (06/20/2023)  Housing: Low Risk  (06/20/2023)  Transportation Needs: No Transportation Needs (06/20/2023)  Utilities: Not At Risk (06/20/2023)  Tobacco Use: Low Risk  (06/20/2023)    Readmission Risk Interventions    06/21/2023    3:16 PM  Readmission Risk Prevention Plan  Post Dischage Appt Complete  Medication Screening Complete  Transportation Screening Complete

## 2023-06-22 NOTE — Consult Note (Signed)
[facility]  INPATIENT CONSULTATION  Reason for Consultation: hyponatremia Requesting Provider: Dr. Jonathon Bellows  HPI: Adam Tapia is an 89 y.o. male with A fib, BPH, HTN, myeloproliferative disorder, CKD 3b who is currently admitted for falls and pneumonia and is seen for evaluation and management of hyponatremia.    Admitted 7/30 after presenting via EMS for falls. Had a 1 month history of increased stools.  Febrile 101.7 initially -> CXR with possible R PNA and is on CAP treatment.  Initial lactate 2.5 and quickly normalized.   Sodium initially 126 7/30 11pm > 128 06/21/23 8am and 123 this AM with K 3.2.  Cr at baseline 1.2-1.3.   06/20/23 urine osm 624 - reportedly appeared dry; urine sodium 46.  Rec'd 1L LR 7/30 PM then 0.9% NS 75/hr from for about 9 hrs stopped 7/31 AM.   I/Os doc per chart yesterday 240 / 1000 UOP. No wt recorded.  NA in room says ate a great breakfast.   Currently working with PT -- some unsteadiness on feet that has been occurring off/on for weeks.  No HA, nausea, visual issues today.   No diarrhea.  Home meds: allopurinol, amlodipine, atenolol, candesartan, hydrea, apap.  No diuretics or SSRI.   PMH: Past Medical History:  Diagnosis Date   Acute blood loss anemia    AF (atrial fibrillation) (HCC)    Allergic rhinitis    Anaphylactic reaction 01/17/2023   Anemia    Arthritis    BPH (benign prostatic hyperplasia)    Constipation    Dysrhythmia    PT STATES HAS HX A FIB - FOLLOWED BY DR.KEVIN LITTLE   GERD (gastroesophageal reflux disease)    OCCASIONAL - HAS NEXIUM IF NEEDED - NOT CURRENTLY TAKING   Hypertension    Myeloproliferative disorder (HCC)    [facility] Arbutus Ped  [facility]   Nocturia    Primary osteoarthritis of left hip    Psoriasis    Unsteady gait    PSH: Past Surgical History:  Procedure Laterality Date   HERNIA REPAIR  1998   TOTAL HIP ARTHROPLASTY Left 03/11/2016   Procedure: LEFT TOTAL HIP ARTHROPLASTY ANTERIOR  APPROACH;  Surgeon: Kathryne Hitch, MD;  Location: [facility];  Service: Orthopedics;  Laterality: Left;  Went from Spinal to an LMA   Past Medical History:  Diagnosis Date   Acute blood loss anemia    AF (atrial fibrillation) (HCC)    Allergic rhinitis    Anaphylactic reaction 01/17/2023   Anemia    Arthritis    BPH (benign prostatic hyperplasia)    Constipation    Dysrhythmia    PT STATES HAS HX A FIB - FOLLOWED BY DR.KEVIN LITTLE   GERD (gastroesophageal reflux disease)    OCCASIONAL - HAS NEXIUM IF NEEDED - NOT CURRENTLY TAKING   Hypertension    Myeloproliferative disorder (HCC)    FOLLOW[facility] BY DR. Arbutus Ped  [facility]   Nocturia    Primary osteoarthritis of left hip    Psoriasis    Unsteady gait     Medications:  I have reviewed the patient's current medications.  Medications Prior to Admission  Medication Sig Dispense Refill   acetaminophen (TYLENOL) 325 MG tablet Take 650 mg by mouth daily as needed for moderate pain.     allopurinol (ZYLOPRIM) 100 MG tablet TAKE 1 TABLET BY MOUTH EVERY DAY (Patient taking differently: Take 100 mg by mouth daily as needed (gout).) 90 tablet 1   amLODipine (NORVASC)  5 MG tablet Take 5 mg by mouth daily.     atenolol (TENORMIN) 25 MG tablet Take 25 mg by mouth daily.  5   candesartan (ATACAND) 8 MG tablet Take 8 mg by mouth daily.     diclofenac Sodium (VOLTAREN) 1 % GEL Apply 2 g topically daily as needed (pain).     fexofenadine (ALLEGRA) 180 MG tablet Take 180 mg by mouth daily.     fluticasone (FLONASE) 50 MCG/ACT nasal spray Place 1 spray into both nostrils daily as needed for allergies or rhinitis.     hydroxyurea (HYDREA) 500 MG capsule TAKE 1 CAPSULE BY MOUTH TWICE A DAY (MAY TAKE WITH FOOD TO MINIMIZE SIDE EFFECTS) (Patient taking differently: Take 500 mg by mouth 2 (two) times daily.) 60 capsule 2   ketotifen (ZADITOR) 0.025 % ophthalmic solution Place 1 drop into both eyes daily as needed (itching eyes).     NON  FORMULARY Apply 1 application  topically daily as needed (pain). Veterinary liniment gel     Probiotic Product (RESTORA) CAPS Take 1 capsule by mouth daily.     promethazine-dextromethorphan (PROMETHAZINE-DM) 6.25-15 MG/5ML syrup Take 2.5-5 mLs by mouth 4 (four) times daily as needed for cough. (Patient not taking: Reported on 01/17/2023) 118 mL 0   triamterene-hydrochlorothiazide (MAXZIDE-25) 37.5-25 MG tablet Take 0.5 tablets by mouth every morning. (Patient not taking: Reported on 06/20/2023)      ALLERGIES:   Allergies  Allergen Reactions   Cinnamon Swelling    Face & throat swelling   Nsaids Other (See Comments)    FAM HX: History reviewed. No pertinent family history.  Social History:   reports that he has never smoked. He has never used smokeless tobacco. He reports that he does not drink alcohol and does not use drugs.  ROS: 12 system ROS neg except per HPI  Blood pressure 134/71, pulse 69, temperature 98.8 F (37.1 C), resp. rate 16, height 5\' 8"  (1.727 m), weight 81.6 kg, SpO2 97%. PHYSICAL EXAM: Gen: well appearing 89yo man  Eyes: anicteric ENT: MMM Neck: supple, no JVD CV:  RRR Abd: soft Back: a few rhonchi on the R, no rales or wheezes,  ^ WOB with exertion GU: no foley, amber urine in male purewick can Extr:  no edema Neuro: AOx3, conversational, telling stories   Results for orders placed or performed during the hospital encounter of 06/20/23 (from the past 48 hour(s))  SARS Coronavirus 2 by RT PCR (hospital order, performed in [facility] hospital lab) *cepheid single result test* Anterior Nasal Swab     Status: None   Collection Time: 06/20/23  2:02 PM   Specimen: Anterior Nasal Swab  Result Value Ref Range   SARS Coronavirus 2 by RT PCR NEGATIVE NEGATIVE    Comment: (NOTE) SARS-CoV-2 target nucleic acids are NOT DETECTED.  The SARS-CoV-2 RNA is generally detectable in upper and lower respiratory specimens during the acute phase of infection. The  lowest concentration of SARS-CoV-2 viral copies this assay can detect is 250 copies / mL. A negative result does not preclude SARS-CoV-2 infection and should not be used as the sole basis for treatment or other patient management decisions.  A negative result may occur with improper specimen collection / handling, submission of specimen other than nasopharyngeal swab, presence of viral mutation(s) within the areas targeted by this assay, and inadequate number of viral copies (<250 copies / mL). A negative result must be combined with clinical observations, patient history, and epidemiological information.  Fact  Sheet for Patients:   RoadLapTop.co.za  Fact Sheet for Healthcare Providers: http://kim-miller.com/  This test is not yet approved or  cleared by the [place] FDA and has been authorized for detection and/or diagnosis of SARS-CoV-2 by FDA under an Emergency Use Authorization (EUA).  This EUA will remain in effect (meaning this test can be used) for the duration of the COVID-19 declaration under Section 564(b)(1) of the Act, 21 U.S.C. section 360bbb-3(b)(1), unless the authorization is terminated or revoked sooner.  Performed at [facility], [address] [place], [place] [address]   Resp panel by RT-PCR (RSV, Flu A&B, Covid) Anterior Nasal Swab     Status: None   Collection Time: 06/20/23  2:02 PM   Specimen: Anterior Nasal Swab  Result Value Ref Range   SARS Coronavirus 2 by RT PCR NEGATIVE NEGATIVE    Comment: (NOTE) SARS-CoV-2 target nucleic acids are NOT DETECTED.  The SARS-CoV-2 RNA is generally detectable in upper respiratory specimens during the acute phase of infection. The lowest concentration of SARS-CoV-2 viral copies this assay can detect is 138 copies/mL. A negative result does not preclude SARS-Cov-2 infection and should not be used as the sole basis for treatment or other patient  management decisions. A negative result may occur with  improper specimen collection/handling, submission of specimen other than nasopharyngeal swab, presence of viral mutation(s) within the areas targeted by this assay, and inadequate number of viral copies(<138 copies/mL). A negative result must be combined with clinical observations, patient history, and epidemiological information. The expected result is Negative.  Fact Sheet for Patients:  BloggerCourse.com  Fact Sheet for Healthcare Providers:  SeriousBroker.it  This test is no t yet approved or cleared by the [place] FDA and  has been authorized for detection and/or diagnosis of SARS-CoV-2 by FDA under an Emergency Use Authorization (EUA). This EUA will remain  in effect (meaning this test can be used) for the duration of the COVID-19 declaration under Section 564(b)(1) of the Act, 21 U.S.C.section 360bbb-3(b)(1), unless the authorization is terminated  or revoked sooner.       Influenza A by PCR NEGATIVE NEGATIVE   Influenza B by PCR NEGATIVE NEGATIVE    Comment: (NOTE) The Xpert Xpress SARS-CoV-2/FLU/RSV plus assay is intended as an aid in the diagnosis of influenza from Nasopharyngeal swab specimens and should not be used as a sole basis for treatment. Nasal washings and aspirates are unacceptable for Xpert Xpress SARS-CoV-2/FLU/RSV testing.  Fact Sheet for Patients: BloggerCourse.com  Fact Sheet for Healthcare Providers: SeriousBroker.it  This test is not yet approved or cleared by the [place] FDA and has been authorized for detection and/or diagnosis of SARS-CoV-2 by FDA under an Emergency Use Authorization (EUA). This EUA will remain in effect (meaning this test can be used) for the duration of the COVID-19 declaration under Section 564(b)(1) of the Act, 21 U.S.C. section 360bbb-3(b)(1), unless the  authorization is terminated or revoked.     Resp Syncytial Virus by PCR NEGATIVE NEGATIVE    Comment: (NOTE) Fact Sheet for Patients: BloggerCourse.com  Fact Sheet for Healthcare Providers: SeriousBroker.it  This test is not yet approved or cleared by the [place] FDA and has been authorized for detection and/or diagnosis of SARS-CoV-2 by FDA under an Emergency Use Authorization (EUA). This EUA will remain in effect (meaning this test can be used) for the duration of the COVID-19 declaration under Section 564(b)(1) of the Act, 21 U.S.C. section 360bbb-3(b)(1), unless the authorization is terminated or revoked.  Performed at [facility], [address], [place], [place] [address]   Comprehensive metabolic panel     Status: Abnormal   Collection Time: 06/20/23  2:10 PM  Result Value Ref Range   Sodium 127 (L) 135 - 145 mmol/L   Potassium 3.6 3.5 - 5.1 mmol/L   Chloride 98 98 - 111 mmol/L   CO2 18 (L) 22 - 32 mmol/L   Glucose, Bld 122 (H) 70 - 99 mg/dL    Comment: Glucose reference range applies only to samples taken after fasting for at least 8 hours.   BUN 22 8 - 23 mg/dL   Creatinine, Ser 6.21 (H) 0.61 - 1.24 mg/dL   Calcium 8.2 (L) 8.9 - 10.3 mg/dL   Total Protein 6.9 6.5 - 8.1 g/dL   Albumin 2.7 (L) 3.5 - 5.0 g/dL   AST 24 15 - 41 U/L   ALT 13 0 - 44 U/L   Alkaline Phosphatase 33 (L) 38 - 126 U/L   Total Bilirubin 0.6 0.3 - 1.2 mg/dL   GFR, Estimated 38 (L) >60 mL/min    Comment: (NOTE) Calculated using the CKD-EPI Creatinine Equation (2021)    Anion gap 11 5 - 15    Comment: Performed at [facility], [address], [place], [place] [address]  CBC with Differential     Status: Abnormal   Collection Time: 06/20/23  2:10 PM  Result Value Ref Range   WBC 6.4 4.0 - 10.5 K/uL   RBC 2.32 (L) 4.22 - 5.81 MIL/uL   Hemoglobin 10.1 (L) 13.0 - 17.0 g/dL   HCT 78.4 (L) 69.6 - 29.5  %   MCV 123.3 (H) 80.0 - 100.0 fL   MCH 43.5 (H) 26.0 - 34.0 pg   MCHC 35.3 30.0 - 36.0 g/dL   RDW 28.4 13.2 - 44.0 %   Platelets 154 150 - 400 K/uL   nRBC 0.0 0.0 - 0.2 %   Neutrophils Relative % 73 %   Neutro Abs 4.7 1.7 - 7.7 K/uL   Lymphocytes Relative 12 %   Lymphs Abs 0.8 0.7 - 4.0 K/uL   Monocytes Relative 10 %   Monocytes Absolute 0.6 0.1 - 1.0 K/uL   Eosinophils Relative 0 %   Eosinophils Absolute 0.0 0.0 - 0.5 K/uL   Basophils Relative 0 %   Basophils Absolute 0.0 0.0 - 0.1 K/uL   WBC Morphology DOHLE BODIES    Immature Granulocytes 5 %   Abs Immature Granulocytes 0.33 (H) 0.00 - 0.07 K/uL    Comment: Performed at [facility], [address], [place], [place] [address]  Urinalysis, Routine w reflex microscopic -Urine, Clean Catch     Status: Abnormal   Collection Time: 06/20/23  3:53 PM  Result Value Ref Range   Color, Urine YELLOW YELLOW   APPearance HAZY (A) CLEAR   Specific Gravity, Urine 1.020 1.005 - 1.030   pH 5.0 5.0 - 8.0   Glucose, UA NEGATIVE NEGATIVE mg/dL   Hgb urine dipstick MODERATE (A) NEGATIVE   Bilirubin Urine NEGATIVE NEGATIVE   Ketones, ur 5 (A) NEGATIVE mg/dL   Protein, ur 536 (A) NEGATIVE mg/dL   Nitrite NEGATIVE NEGATIVE   Leukocytes,Ua NEGATIVE NEGATIVE   RBC / HPF 0-5 0 - 5 RBC/hpf   WBC, UA 0-5 0 - 5 WBC/hpf   Bacteria, UA RARE (A) NONE SEEN   Squamous Epithelial / HPF 0-5 0 - 5 /HPF   Mucus PRESENT  Hyaline Casts, UA PRESENT    Granular Casts, UA PRESENT     Comment: Performed at [facility], [address] [place], [place] [address]  Strep pneumoniae urinary antigen     Status: None   Collection Time: 06/20/23  3:53 PM  Result Value Ref Range   Strep Pneumo Urinary Antigen NEGATIVE NEGATIVE    Comment:        Infection due to S. pneumoniae cannot be absolutely ruled out since the antigen present may be below the detection limit of the test. Performed at [facility], [address], [place], [place] [address]   Sodium, urine, random     Status: None   Collection Time: 06/20/23  3:53 PM  Result Value Ref Range   Sodium, Ur 46 mmol/L    Comment: Performed at [facility], [address], [place], [place] [address]  Osmolality, urine     Status: None   Collection Time: 06/20/23  3:53 PM  Result Value Ref Range   Osmolality, Ur 624 300 - 900 mOsm/kg    Comment: Performed at [facility], [address], [place], [place] [address]  Protime-INR     Status: Abnormal   Collection Time: 06/20/23  5:39 PM  Result Value Ref Range   Prothrombin Time 15.4 (H) 11.4 - 15.2 seconds   INR 1.2 0.8 - 1.2    Comment: (NOTE) INR goal varies based on device and disease states. Performed at [facility], [address] [place], [place] [address]   APTT     Status: None   Collection Time: 06/20/23  5:39 PM  Result Value Ref Range   aPTT 28 24 - 36 seconds    Comment: Performed at [facility], [address], [place], [place] [address]  Blood Culture (routine x 2)     Status: None (Preliminary result)   Collection Time: 06/20/23  5:39 PM   Specimen: Right Antecubital; Blood  Result Value Ref Range   Specimen Description      RIGHT ANTECUBITAL Performed at [facility], [address] [place], [place] [address]    Special Requests      BOTTLES DRAWN AEROBIC AND ANAEROBIC Blood Culture results may not be optimal due to an inadequate volume of blood received in culture bottles Performed at [facility], [address], [place], [place] [address]    Culture      NO GROWTH 2 DAYS Performed at [facility], [address], [place], [place] [address]    Report Status PENDING   I-Stat Lactic Acid, ED     Status: Abnormal   Collection Time: 06/20/23  5:54 PM  Result Value Ref Range   Lactic Acid, Venous 2.5 (HH) 0.5 - 1.9 mmol/L   Comment NOTIFIED PHYSICIAN   Blood  Culture (routine x 2)     Status: None (Preliminary result)   Collection Time: 06/20/23  6:34 PM   Specimen: BLOOD LEFT HAND  Result Value Ref Range   Specimen Description      BLOOD LEFT HAND Performed at [facility], [address], [place], [place] [address]    Special Requests      BOTTLES DRAWN AEROBIC AND ANAEROBIC Blood Culture adequate volume Performed at [facility], [address] [place], [place] [address]    Culture      NO GROWTH 2 DAYS Performed at [facility], [address] [place], [place] [address]    Report Status PENDING   Culture, blood (routine x 2) Call MD if unable to obtain prior to antibiotics being given     Status: None (Preliminary result)   Collection Time: 06/20/23  9:58 PM   Specimen: BLOOD LEFT HAND  Result Value Ref Range   Specimen Description      BLOOD LEFT HAND Performed at [facility], [address] [place], [place] [address]    Special Requests      BOTTLES DRAWN AEROBIC ONLY Blood Culture adequate volume Performed at [facility], [address], [place], [place] [address]    Culture      NO GROWTH 1 DAY Performed at [facility], [address], [place], [place] [address]    Report Status PENDING   Basic metabolic panel     Status: Abnormal   Collection Time: 06/20/23  9:58 PM  Result Value Ref Range   Sodium 126 (L) 135 - 145 mmol/L   Potassium 3.5 3.5 - 5.1 mmol/L   Chloride 95 (L) 98 - 111 mmol/L   CO2 21 (L) 22 - 32 mmol/L   Glucose, Bld 147 (H) 70 - 99 mg/dL    Comment: Glucose reference range applies only to samples taken after fasting for at least 8 hours.   BUN 19 8 - 23 mg/dL   Creatinine, Ser 0.10 (H) 0.61 - 1.24 mg/dL   Calcium 8.6 (L) 8.9 - 10.3 mg/dL   GFR, Estimated 50 (L) >60 mL/min    Comment: (NOTE) Calculated using the CKD-EPI Creatinine Equation (2021)    Anion gap 10 5 - 15    Comment: Performed at [facility], [address] [place], [place] [address]  Magnesium     Status: None   Collection Time: 06/20/23  9:58 PM  Result Value Ref Range   Magnesium 1.7 1.7 - 2.4 mg/dL    Comment: Performed at [facility], [address], [place], [place] [address]  Brain natriuretic peptide     Status: Abnormal   Collection Time: 06/20/23  9:58 PM  Result Value Ref Range   B Natriuretic Peptide 169.1 (H) 0.0 - 100.0 pg/mL    Comment: Performed at [facility], [address], [place], [place] [address]  Hemoglobin A1c     Status: Abnormal   Collection Time: 06/20/23  9:58 PM  Result Value Ref Range   Hgb A1c MFr Bld 5.9 (H) 4.8 - 5.6 %    Comment: (NOTE)         Prediabetes: 5.7 - 6.4         Diabetes: >6.4         Glycemic control for adults with diabetes: <7.0    Mean Plasma Glucose 123 mg/dL    Comment: (NOTE) Performed At: [facility] [address] [place], [place] [id number] Jolene Schimke MD [id number]   TSH     Status: None   Collection Time: 06/20/23  9:58 PM  Result Value Ref Range   TSH 0.397 0.350 - 4.500 uIU/mL    Comment: Performed by a 3rd Generation assay with a functional sensitivity of <=0.01 uIU/mL. Performed at [facility], [address], [place], [place] [address]   Lactic acid, plasma     Status: None   Collection Time: 06/20/23  9:58 PM  Result Value Ref Range   Lactic Acid,  Venous 1.9 0.5 - 1.9 mmol/L    Comment: Performed at [facility], [address] [place], [place] [address]  Sodium     Status: Abnormal   Collection Time: 06/21/23 12:57 AM  Result Value Ref Range   Sodium 124 (L) 135 - 145 mmol/L    Comment: Performed at [facility], [address] [place], [place] [address]  Lactic acid, plasma     Status: None   Collection Time: 06/21/23 12:57 AM  Result Value Ref Range   Lactic Acid, Venous 1.1 0.5 - 1.9 mmol/L    Comment: Performed at [facility], [address] [place], [place] [address]  Vitamin B12     Status: None   Collection Time: 06/21/23 12:57 AM  Result Value Ref Range   Vitamin B-12 309 180 - 914 pg/mL    Comment: (NOTE) This assay is not validated for testing neonatal or myeloproliferative syndrome specimens for Vitamin B12 levels. Performed at [facility], [address], [place], [place] [address]   Folate     Status: None   Collection Time: 06/21/23 12:57 AM  Result Value Ref Range   Folate 12.1 >5.9 ng/mL    Comment: Performed at [facility], [address] [place], [place] [address]  Iron and TIBC     Status: Abnormal   Collection Time: 06/21/23 12:57 AM  Result Value Ref Range   Iron 11 (L) 45 - 182 ug/dL   TIBC 034 (L) 742 - 595 ug/dL   Saturation Ratios 6 (L) 17.9 - 39.5 %   UIBC 175 ug/dL    Comment: Performed at [facility], [address], [place], [place] [address]  Ferritin     Status: Abnormal   Collection Time: 06/21/23 12:57 AM  Result Value Ref Range   Ferritin 495 (H) 24 - 336 ng/mL    Comment: Performed at [facility], [address], [place], [place] [address]  Reticulocytes     Status: Abnormal   Collection Time: 06/21/23 12:57 AM  Result Value Ref Range   Retic Ct Pct 1.2 0.4 - 3.1 %   RBC. 2.58 (L) 4.22 - 5.81 MIL/uL   Retic Count, Absolute 32.0 19.0 - 186.0 K/uL   Immature Retic Fract 16.0 (H) 2.3 - 15.9 %    Comment: Performed at [facility], [address] [place], [place] [address]  Comprehensive metabolic panel     Status: Abnormal   Collection Time: 06/21/23  8:06 AM  Result Value Ref Range   Sodium 128 (L) 135 - 145 mmol/L   Potassium 3.5 3.5 - 5.1 mmol/L   Chloride 96 (L) 98 - 111 mmol/L   CO2 19 (L) 22 - 32 mmol/L   Glucose, Bld 105 (H) 70 - 99 mg/dL    Comment: Glucose reference range applies only to samples taken after fasting for at least 8 hours.    BUN 15 8 - 23 mg/dL   Creatinine, Ser 4.16 0.61 - 1.24 mg/dL   Calcium 8.8 (L) 8.9 - 10.3 mg/dL   Total Protein 7.4 6.5 - 8.1 g/dL   Albumin 2.7 (L) 3.5 - 5.0 g/dL   AST 39 15 - 41 U/L   ALT 18 0 - 44 U/L   Alkaline Phosphatase 36 (L) 38 - 126 U/L   Total Bilirubin 0.4 0.3 - 1.2 mg/dL   GFR, Estimated 54 (L) >60 mL/min  Comment: (NOTE) Calculated using the CKD-EPI Creatinine Equation (2021)    Anion gap 13 5 - 15    Comment: Performed at [facility], [address], [place], [place] [address]  CBC     Status: Abnormal   Collection Time: 06/21/23  8:06 AM  Result Value Ref Range   WBC 5.9 4.0 - 10.5 K/uL   RBC 2.85 (L) 4.22 - 5.81 MIL/uL   Hemoglobin 12.1 (L) 13.0 - 17.0 g/dL   HCT 60.4 (L) 54.0 - 98.1 %   MCV 119.3 (H) 80.0 - 100.0 fL   MCH 42.5 (H) 26.0 - 34.0 pg   MCHC 35.6 30.0 - 36.0 g/dL   RDW 19.1 47.8 - 29.5 %   Platelets 169 150 - 400 K/uL   nRBC 0.0 0.0 - 0.2 %    Comment: Performed at [facility], [address], [place], [place] [address]  CBC     Status: Abnormal   Collection Time: 06/22/23  5:19 AM  Result Value Ref Range   WBC 4.0 4.0 - 10.5 K/uL   RBC 2.32 (L) 4.22 - 5.81 MIL/uL   Hemoglobin 10.2 (L) 13.0 - 17.0 g/dL   HCT 86.5 (L) 78.4 - 69.6 %   MCV 124.1 (H) 80.0 - 100.0 fL   MCH 44.0 (H) 26.0 - 34.0 pg   MCHC 35.4 30.0 - 36.0 g/dL   RDW 29.5 28.4 - 13.2 %   Platelets 145 (L) 150 - 400 K/uL   nRBC 0.0 0.0 - 0.2 %    Comment: Performed at [facility], [address] [place], [place] [address]  Basic metabolic panel     Status: Abnormal   Collection Time: 06/22/23  5:19 AM  Result Value Ref Range   Sodium 123 (L) 135 - 145 mmol/L   Potassium 3.2 (L) 3.5 - 5.1 mmol/L   Chloride 94 (L) 98 - 111 mmol/L   CO2 20 (L) 22 - 32 mmol/L   Glucose, Bld 107 (H) 70 - 99 mg/dL    Comment: Glucose reference range applies only to samples taken after fasting for at least 8 hours.   BUN 15 8 - 23 mg/dL    Creatinine, Ser 2.72 (H) 0.61 - 1.24 mg/dL   Calcium 8.6 (L) 8.9 - 10.3 mg/dL   GFR, Estimated 52 (L) >60 mL/min    Comment: (NOTE) Calculated using the CKD-EPI Creatinine Equation (2021)    Anion gap 9 5 - 15    Comment: Performed at [facility], [address], [place], [place] [address]  Procalcitonin     Status: None   Collection Time: 06/22/23  5:19 AM  Result Value Ref Range   Procalcitonin 0.85 ng/mL    Comment:        Interpretation: PCT > 0.5 ng/mL and <= 2 ng/mL: Systemic infection (sepsis) is possible, but other conditions are known to elevate PCT as well. (NOTE)       Sepsis PCT Algorithm           Lower Respiratory [facility]                                      Infection PCT Algorithm    ----------------------------     ----------------------------         PCT < 0.25 ng/mL  PCT < 0.10 ng/mL          Strongly encourage             Strongly discourage   discontinuation of antibiotics    initiation of antibiotics    ----------------------------     -----------------------------       PCT 0.25 - 0.50 ng/mL            PCT 0.10 - 0.25 ng/mL               OR       >80% decrease in PCT            Discourage initiation of                                            antibiotics      Encourage discontinuation           of antibiotics    ----------------------------     -----------------------------         PCT >= 0.50 ng/mL              PCT 0.26 - 0.50 ng/mL                AND       <80% decrease in PCT             Encourage initiation of                                             antibiotics       Encourage continuation           of antibiotics    ----------------------------     -----------------------------        PCT >= 0.50 ng/mL                  PCT > 0.50 ng/mL               AND         increase in PCT                  Strongly encourage                                      initiation of antibiotics    Strongly encourage  escalation           of antibiotics                                     -----------------------------                                           PCT <= 0.25 ng/mL                                                 OR                                        >  80% decrease in PCT                                      Discontinue / Do not initiate                                             antibiotics  Performed at [facility], [address], [place], [place] [address]     DG Chest Port 1 View  Result Date: 06/20/2023 CLINICAL DATA:  Pain after fall EXAM: PORTABLE CHEST 1 VIEW COMPARISON:  01/17/23 FINDINGS: Mild opacity at the right mid and lower lung. No pneumothorax, effusion or edema. Normal cardiopericardial silhouette. Overlapping cardiac leads. Film is under penetrated. IMPRESSION: Mild opacity at the right mid and lower lung. Acute infiltrates possible. Recommend follow-up to confirm clearance. Electronically Signed   By: Karen Kays M.D.   On: 06/20/2023 14:23   CT Head Wo Contrast  Result Date: 06/20/2023 CLINICAL DATA:  Polytrauma, blunt EXAM: CT HEAD WITHOUT CONTRAST CT CERVICAL SPINE WITHOUT CONTRAST TECHNIQUE: Multidetector CT imaging of the head and cervical spine was performed following the standard protocol without intravenous contrast. Multiplanar CT image reconstructions of the cervical spine were also generated. RADIATION DOSE REDUCTION: This exam was performed according to the departmental dose-optimization program which includes automated exposure control, adjustment of the mA and/or kV according to patient size and/or use of iterative reconstruction technique. COMPARISON:  None Available. FINDINGS: CT HEAD FINDINGS Brain: No evidence of acute infarction, hemorrhage, hydrocephalus, extra-axial collection or mass lesion/mass effect. Sequela of mild chronic microvascular ischemic change. Generalized volume loss. Prominent subarachnoid spaces bilaterally are  likely related to the degree of volume loss. Vascular: No hyperdense vessel or unexpected calcification. Skull: Normal. Negative for fracture or focal lesion. Sinuses/Orbits: No middle ear or mastoid effusion. Paranasal sinuses are notable for complete opacification of the left frontal sinus. Orbits are unremarkable. Other: None. CT CERVICAL SPINE FINDINGS Alignment: Trace retrolisthesis of C5 on C6. Grade 1 anterolisthesis of C7 on T1. Skull base and vertebrae: No acute fracture. No primary bone lesion or focal pathologic process. Soft tissues and spinal canal: No prevertebral fluid or swelling. No visible canal hematoma. Disc levels:  No evidence of high-grade spinal canal stenosis. Upper chest: Negative. Other: None IMPRESSION: 1. No acute intracranial abnormality. 2. No acute fracture or traumatic subluxation of the cervical spine. Electronically Signed   By: Lorenza Cambridge M.D.   On: 06/20/2023 14:15   CT Cervical Spine Wo Contrast  Result Date: 06/20/2023 CLINICAL DATA:  Polytrauma, blunt EXAM: CT HEAD WITHOUT CONTRAST CT CERVICAL SPINE WITHOUT CONTRAST TECHNIQUE: Multidetector CT imaging of the head and cervical spine was performed following the standard protocol without intravenous contrast. Multiplanar CT image reconstructions of the cervical spine were also generated. RADIATION DOSE REDUCTION: This exam was performed according to the departmental dose-optimization program which includes automated exposure control, adjustment of the mA and/or kV according to patient size and/or use of iterative reconstruction technique. COMPARISON:  None Available. FINDINGS: CT HEAD FINDINGS Brain: No evidence of acute infarction, hemorrhage, hydrocephalus, extra-axial collection or mass lesion/mass effect. Sequela of mild chronic microvascular ischemic change. Generalized volume loss. Prominent subarachnoid spaces bilaterally are likely related to the degree of volume loss. Vascular: No hyperdense vessel or unexpected  calcification. Skull:  Normal. Negative for fracture or focal lesion. Sinuses/Orbits: No middle ear or mastoid effusion. Paranasal sinuses are notable for complete opacification of the left frontal sinus. Orbits are unremarkable. Other: None. CT CERVICAL SPINE FINDINGS Alignment: Trace retrolisthesis of C5 on C6. Grade 1 anterolisthesis of C7 on T1. Skull base and vertebrae: No acute fracture. No primary bone lesion or focal pathologic process. Soft tissues and spinal canal: No prevertebral fluid or swelling. No visible canal hematoma. Disc levels:  No evidence of high-grade spinal canal stenosis. Upper chest: Negative. Other: None IMPRESSION: 1. No acute intracranial abnormality. 2. No acute fracture or traumatic subluxation of the cervical spine. Electronically Signed   By: Lorenza Cambridge M.D.   On: 06/20/2023 14:15    Assessment/Plan **CAP: on CTX and azithromycin. Afebrile and on RA.   **Hyponatremia:  Initially thought hypovolemic and given IVF.  Serum sodium trending down now and appears euvolemic. TSH normal.  Suspect SIADH related to pneumonia.  Resending urine sodium and osm today.  Check BMP at 2pm.  For now would fluid restrict, replete K.  Suspect his instability on ambulating today is multifactorial and I don't think 3% saline is indicated at this moment.  Cont I/Os, daily weights.   **Hypokalemia: KCl 40 given this AM  **Myeloproliferative DO complicated by anemia:  f/b onc out pt, on hydrea.   **A fib: rate controlled, eliquis being reconsidered.  Will follow, reach out with concerns.   Tyler Pita 06/22/2023, 10:30 AM

## 2023-06-22 NOTE — Hospital Course (Addendum)
Mr. Arns is a 89 yo male with PMH MPD, afib, HTN, GERD, CKD3/4, psoriasis, unsteady gait who was brought to the hospital after recurrent falls and weakness.  Imaging studies were negative for fractures or injury. CXR noted mild opacity in the right middle and lower lung.  He was started on antibiotics for pneumonia treatment. Further workup also revealed hyponatremia, 127.  He was admitted for further workup for etiology and nephrology evaluation.

## 2023-06-22 NOTE — Plan of Care (Signed)

## 2023-06-22 NOTE — Progress Notes (Signed)
Mobility Specialist - Progress Note   06/22/23 1056  Mobility  Activity Transferred from bed to chair  Level of Assistance Moderate assist, patient does 50-74%  Assistive Device Front wheel walker  Distance Ambulated (ft) 2 ft  Activity Response Tolerated well  Mobility Referral Yes  $Mobility charge 1 Mobility  Mobility Specialist Start Time (ACUTE ONLY) 1035  Mobility Specialist Stop Time (ACUTE ONLY) 1055  Mobility Specialist Time Calculation (min) (ACUTE ONLY) 20 min   Pt received in bed and agreeable to mobility. Pt was ModA for bed mobility & required +2 assistance from STS. No complaints during session. Pt to recliner after session with all needs met. Chair alarm on.   [facility]

## 2023-06-22 NOTE — Plan of Care (Signed)
  Problem: Activity: Goal: Ability to tolerate increased activity will improve Outcome: Progressing   Problem: Respiratory: Goal: Ability to maintain adequate ventilation will improve Outcome: Progressing   Problem: Activity: Goal: Risk for activity intolerance will decrease Outcome: Progressing   Problem: Nutrition: Goal: Adequate nutrition will be maintained Outcome: Progressing

## 2023-06-23 DIAGNOSIS — J189 Pneumonia, unspecified organism: Secondary | ICD-10-CM | POA: Diagnosis not present

## 2023-06-23 LAB — BASIC METABOLIC PANEL
Anion gap: 9 (ref 5–15)
BUN: 19 mg/dL (ref 8–23)
CO2: 20 mmol/L — ABNORMAL LOW (ref 22–32)
Calcium: 8.5 mg/dL — ABNORMAL LOW (ref 8.9–10.3)
Chloride: 94 mmol/L — ABNORMAL LOW (ref 98–111)
Creatinine, Ser: 1.24 mg/dL (ref 0.61–1.24)
GFR, Estimated: 53 mL/min — ABNORMAL LOW (ref >60)
Glucose, Bld: 128 mg/dL — ABNORMAL HIGH (ref 70–99)
Potassium: 4.5 mmol/L (ref 3.5–5.1)
Sodium: 123 mmol/L — ABNORMAL LOW (ref 135–145)

## 2023-06-23 MED ORDER — POTASSIUM CHLORIDE CRYS ER 20 MEQ PO TBCR
40.0000 meq | EXTENDED_RELEASE_TABLET | Freq: Two times a day (BID) | ORAL | Status: AC
  Administered 2023-06-23 (×2): 40 meq via ORAL
  Filled 2023-06-23 (×2): qty 2

## 2023-06-23 MED ORDER — LACTATED RINGERS IV BOLUS
500.0000 mL | Freq: Once | INTRAVENOUS | Status: AC
  Administered 2023-06-23: 500 mL via INTRAVENOUS

## 2023-06-23 MED ORDER — ENSURE ENLIVE PO LIQD
237.0000 mL | Freq: Two times a day (BID) | ORAL | Status: DC
  Administered 2023-06-23 – 2023-06-26 (×6): 237 mL via ORAL

## 2023-06-23 MED ORDER — UREA 15 G PO PACK
30.0000 g | PACK | Freq: Two times a day (BID) | ORAL | Status: DC
  Administered 2023-06-23 – 2023-06-26 (×6): 30 g via ORAL
  Filled 2023-06-23 (×6): qty 2

## 2023-06-23 NOTE — Progress Notes (Signed)
PROGRESS NOTE Adam Tapia  WUJ:[id number] DOB: 1923/06/26 DOA: 06/20/2023 PCP: Fleet Contras, MD  Brief Narrative/Hospital Course:  89 y.o. male with medical history significant of Atrial fibrillation, Hypertension,Myeloproliferative disorder, GERD, constipation ,unsteady gait ,psoriasis,CKDIIIb-IV who presented to ED BIBEMS  with complaint of multiple falls over the last 2 days PTA. Patient has per family been weaker over the last few days with recurrent falls.  He had a  mechanical with  head strike resulting in abrasion on left side of his nose per family.Of note family states patient has had increase stools x 1 month and is currently undergoing evaluation by pcp.In the field patient was noted to have temp of 101.7 for which he was given 1 gram of tylenol enroute.  7/30 in the ED: Vitals are stable low-grade fever Labs showed> hyponatremia 127 creatinine 1.6, lactic acidosis 2.5> subsequently resolved to 1.1, hemoglobin 10.1, influenza RSV COVID-negative CT head and C SPINE> no acute finding or fracture Chest x-ray> mild opacity in the right mid and lower lung acute infiltrates possible UA unremarkable Blood culture sent Patient was admitted for pneumonia   Subjective: Patient seen and examined this morning Comfortably resting in the bedside chair No new complaints Past 24 hours she has been afebrile, BP stable Labs again this morning showing sodium about the same at 123 potassium 3.4 creatinine further down 1.2, CBC with chronic anemia microcytic  Assessment and Plan: Principal Problem:   CAP (community acquired pneumonia)   Community-acquired pneumonia: Imaging showed opacities on chest x-ray, although hemodynamically stable -had fever at hom.  So far urine antigen sugars negative, blood culture negative.  Lactic acidosis resolved and procalcitonin at 0.8. Continue with current empiric antibiotics, pulmonary toileting speech follow-up aspiration precaution.  Encourage mobilization    Hyponatremia: Initially presumed to be hypovolemic given trial of IV fluids, however sodium fluctuating and downtrending.?  SIADH -TSH 0.3, urine osmole elevated 624 and sodium 46. Currently off ivf.nephrology was consulted -repeat urine osmole elevated 703 and interestingly urine sod at 10> will give 500 cc bolus to correct hypovolemia as sodium remains low at 123-124. Cont to check serial BMP per nephrology.  Recent Labs  Lab 06/21/23 0057 06/21/23 0806 06/22/23 0519 06/22/23 1347 06/23/23 0525  NA 124* 128* 123* 124* 123*    Myeloproliferative d/o  Microcytic anemia: Anemia panel reviewed iron low at 11 ferritin 495 B12 on lower side 319 folate normal.  Added B12 supplement as patient also macrocytic, presumed due to MDS.Transfuse if <7 g.  Continue Hydrea. Recent Labs  Lab 06/20/23 1410 06/21/23 0806 06/22/23 0519 06/23/23 0525  HGB 10.1* 12.1* 10.2* 10.6*  HCT 28.6* 34.0* 28.8* 30.4*    CKDIIIb-IV: Creatinine seems to be downtrending and stable as below Recent Labs    12/26/22 1313 01/17/23 0219 02/02/23 1146 03/27/23 1442 06/20/23 1410 06/20/23 2158 06/21/23 0806 06/22/23 0519 06/22/23 1347 06/23/23 0525  BUN 33* 36* 46* 37* 22 19 15 15 16 18   CREATININE 1.56* 1.81* 2.06* 1.76* 1.63* 1.30* 1.21 1.26* 1.36* 1.21  CO2 25 19* 23 22 18* 21* 19* 20* 20* 19*    Hypokalemia: replaced Recent Labs  Lab 06/20/23 2158 06/21/23 0806 06/22/23 0519 06/22/23 1347 06/23/23 0525  K 3.5 3.5 3.2* 3.8 3.4*  CALCIUM 8.6* 8.8* 8.6* 8.7* 8.4*  MG 1.7  --   --   --   --    GERD Cont ppi    Constipation  Cont bowel regimen .  Last BM 8/1   Atrial fib:  Rate  controlled on atenolol.  At home on Eliquis- patient with falls and is 89>? Benefit of Eliquis at this time vs risk-related to discussed with his primary cardiology team regarding further continuation of Eliquis before discharge  Hypertension BP stable. Cont amlodpine and atenolol   Unsteady gait-chronic Multiple  falls: Continue PT OT, supportive care fall precaution.  Uses cane normally at home.   DVT prophylaxis: SCDs Start: 06/20/23 1759 Code Status:   Code Status: Full Code Family Communication: plan of care discussed with patient at bedside. Patient status is: Inpatient because of ongoing hyponatremia Level of care: Telemetry   Dispo: The patient is from: home            Anticipated disposition: SNF has been recommended by PT OT but patient seems reluctant reports he wants to go home with wife Objective: Vitals last 24 hrs: Vitals:   06/22/23 1404 06/22/23 1945 06/23/23 0335 06/23/23 1155  BP: 133/86 137/78  105/61  Pulse: 65 65  67  Resp: 17 20  16   Temp: 98.3 F (36.8 C) 99.4 F (37.4 C)  97.6 F (36.4 C)  TempSrc:    Oral  SpO2: 99% 96%  100%  Weight:   86.8 kg   Height:       Weight change:   Physical Examination: General exam: alert awake, weak, HEENT:Oral mucosa moist, Ear/Nose WNL grossly Respiratory system: Bilaterally clear BS,no use of accessory muscle Cardiovascular system: S1 & S2 +, No JVD. Gastrointestinal system: Abdomen soft,NT,ND, BS+ Nervous System: Alert, awake, moving all extremities,and following commands. Extremities: LE edema neg,distal peripheral pulses palpable and warm.  Skin: No rashes,no icterus. MSK: Normal muscle bulk,tone, power   Medications reviewed:  Scheduled Meds:  acetaminophen  650 mg Oral Once   acidophilus  2 capsule Oral Daily   amLODipine  5 mg Oral Daily   atenolol  25 mg Oral Daily   vitamin B-12  500 mcg Oral Daily   feeding supplement  237 mL Oral BID BM   hydroxyurea  500 mg Oral BID   pantoprazole  40 mg Oral Daily   polyethylene glycol  17 g Oral Daily   potassium chloride  40 mEq Oral BID   Continuous Infusions:  azithromycin 500 mg (06/22/23 1802)   cefTRIAXone (ROCEPHIN)  IV 2 g (06/22/23 1710)    Diet Order             Diet Heart Room service appropriate? Yes; Fluid consistency: Thin; Fluid restriction: 1200  mL Fluid  Diet effective now                   Intake/Output Summary (Last 24 hours) at 06/23/2023 1158 Last data filed at 06/23/2023 0959 Gross per 24 hour  Intake 1078 ml  Output 350 ml  Net 728 ml   Net IO Since Admission: 2,034.89 mL [06/23/23 1158]  Wt Readings from Last 3 Encounters:  06/23/23 86.8 kg  03/27/23 84.5 kg  01/17/23 86.1 kg     Unresulted Labs (From admission, onward)     Start     Ordered   06/23/23 1400  Basic metabolic panel  Once,   R       Question:  Specimen collection method  Answer:  Lab=Lab collect   06/23/23 0745   06/23/23 0500  Basic metabolic panel  Daily,   R     Question:  Specimen collection method  Answer:  Lab=Lab collect   06/22/23 0848   06/23/23 0500  CBC  Daily,  R     Question:  Specimen collection method  Answer:  Lab=Lab collect   06/22/23 0848   06/20/23 1800  Expectorated Sputum Assessment w Gram Stain, Rflx to Resp Cult  Once,   R        06/20/23 1801   06/20/23 1751  Expectorated Sputum Assessment w Gram Stain, Rflx to Resp Cult  (COPD / Pneumonia / Cellulitis / Lower Extremity Wound)  Once,   R        06/20/23 1801   06/20/23 1751  Legionella Pneumophila Serogp 1 Ur Ag  (COPD / Pneumonia / Cellulitis / Lower Extremity Wound)  Once,   R        06/20/23 1801          Data Reviewed: I have personally reviewed following labs and imaging studies CBC: Recent Labs  Lab 06/20/23 1410 06/21/23 0806 06/22/23 0519 06/23/23 0525  WBC 6.4 5.9 4.0 3.4*  NEUTROABS 4.7  --   --   --   HGB 10.1* 12.1* 10.2* 10.6*  HCT 28.6* 34.0* 28.8* 30.4*  MCV 123.3* 119.3* 124.1* 122.1*  PLT 154 169 145* 141*   Basic Metabolic Panel: Recent Labs  Lab 06/20/23 2158 06/21/23 0057 06/21/23 0806 06/22/23 0519 06/22/23 1347 06/23/23 0525  NA 126* 124* 128* 123* 124* 123*  K 3.5  --  3.5 3.2* 3.8 3.4*  CL 95*  --  96* 94* 94* 94*  CO2 21*  --  19* 20* 20* 19*  GLUCOSE 147*  --  105* 107* 117* 96  BUN 19  --  15 15 16 18   CREATININE  1.30*  --  1.21 1.26* 1.36* 1.21  CALCIUM 8.6*  --  8.8* 8.6* 8.7* 8.4*  MG 1.7  --   --   --   --   --    GFR: Estimated Creatinine Clearance: 36.5 mL/min (by C-G formula based on SCr of 1.21 mg/dL). Liver Function Tests: Recent Labs  Lab 06/20/23 1410 06/21/23 0806  AST 24 39  ALT 13 18  ALKPHOS 33* 36*  BILITOT 0.6 0.4  PROT 6.9 7.4  ALBUMIN 2.7* 2.7*   Recent Labs  Lab 06/20/23 1739  INR 1.2  BNP (last 3 results) No results for input(s): "PROBNP" in the last 8760 hours. HbA1C: Recent Labs    06/20/23 2158  HGBA1C 5.9*   Recent Labs    06/20/23 2158  TSH 0.397   Sepsis Labs: Recent Labs  Lab 06/20/23 1754 06/20/23 2158 06/21/23 0057 06/22/23 0519  PROCALCITON  --   --   --  0.85  LATICACIDVEN 2.5* 1.9 1.1  --     Recent Results (from the past 240 hour(s))  SARS Coronavirus 2 by RT PCR (hospital order, performed in [facility] hospital lab) *cepheid single result test* Anterior Nasal Swab     Status: None   Collection Time: 06/20/23  2:02 PM   Specimen: Anterior Nasal Swab  Result Value Ref Range Status   SARS Coronavirus 2 by RT PCR NEGATIVE NEGATIVE Final    Comment: (NOTE) SARS-CoV-2 target nucleic acids are NOT DETECTED.  The SARS-CoV-2 RNA is generally detectable in upper and lower respiratory specimens during the acute phase of infection. The lowest concentration of SARS-CoV-2 viral copies this assay can detect is 250 copies / mL. A negative result does not preclude SARS-CoV-2 infection and should not be used as the sole basis for treatment or other patient management decisions.  A negative result may occur with improper  specimen collection / handling, submission of specimen other than nasopharyngeal swab, presence of viral mutation(s) within the areas targeted by this assay, and inadequate number of viral copies (<250 copies / mL). A negative result must be combined with clinical observations, patient history, and epidemiological  information.  Fact Sheet for Patients:   RoadLapTop.co.za  Fact Sheet for Healthcare Providers: http://kim-miller.com/  This test is not yet approved or  cleared by the [place] FDA and has been authorized for detection and/or diagnosis of SARS-CoV-2 by FDA under an Emergency Use Authorization (EUA).  This EUA will remain in effect (meaning this test can be used) for the duration of the COVID-19 declaration under Section 564(b)(1) of the Act, 21 U.S.C. section 360bbb-3(b)(1), unless the authorization is terminated or revoked sooner.  Performed at [facility], [address], [place], [place] [address]   Resp panel by RT-PCR (RSV, Flu A&B, Covid) Anterior Nasal Swab     Status: None   Collection Time: 06/20/23  2:02 PM   Specimen: Anterior Nasal Swab  Result Value Ref Range Status   SARS Coronavirus 2 by RT PCR NEGATIVE NEGATIVE Final    Comment: (NOTE) SARS-CoV-2 target nucleic acids are NOT DETECTED.  The SARS-CoV-2 RNA is generally detectable in upper respiratory specimens during the acute phase of infection. The lowest concentration of SARS-CoV-2 viral copies this assay can detect is 138 copies/mL. A negative result does not preclude SARS-Cov-2 infection and should not be used as the sole basis for treatment or other patient management decisions. A negative result may occur with  improper specimen collection/handling, submission of specimen other than nasopharyngeal swab, presence of viral mutation(s) within the areas targeted by this assay, and inadequate number of viral copies(<138 copies/mL). A negative result must be combined with clinical observations, patient history, and epidemiological information. The expected result is Negative.  Fact Sheet for Patients:  BloggerCourse.com  Fact Sheet for Healthcare Providers:  SeriousBroker.it  This test is  no t yet approved or cleared by the [place] FDA and  has been authorized for detection and/or diagnosis of SARS-CoV-2 by FDA under an Emergency Use Authorization (EUA). This EUA will remain  in effect (meaning this test can be used) for the duration of the COVID-19 declaration under Section 564(b)(1) of the Act, 21 U.S.C.section 360bbb-3(b)(1), unless the authorization is terminated  or revoked sooner.       Influenza A by PCR NEGATIVE NEGATIVE Final   Influenza B by PCR NEGATIVE NEGATIVE Final    Comment: (NOTE) The Xpert Xpress SARS-CoV-2/FLU/RSV plus assay is intended as an aid in the diagnosis of influenza from Nasopharyngeal swab specimens and should not be used as a sole basis for treatment. Nasal washings and aspirates are unacceptable for Xpert Xpress SARS-CoV-2/FLU/RSV testing.  Fact Sheet for Patients: BloggerCourse.com  Fact Sheet for Healthcare Providers: SeriousBroker.it  This test is not yet approved or cleared by the [place] FDA and has been authorized for detection and/or diagnosis of SARS-CoV-2 by FDA under an Emergency Use Authorization (EUA). This EUA will remain in effect (meaning this test can be used) for the duration of the COVID-19 declaration under Section 564(b)(1) of the Act, 21 U.S.C. section 360bbb-3(b)(1), unless the authorization is terminated or revoked.     Resp Syncytial Virus by PCR NEGATIVE NEGATIVE Final    Comment: (NOTE) Fact Sheet for Patients: BloggerCourse.com  Fact Sheet for Healthcare Providers: SeriousBroker.it  This test is not yet approved or cleared by the [place] and  has been authorized for detection and/or diagnosis of SARS-CoV-2 by FDA under an Emergency Use Authorization (EUA). This EUA will remain in effect (meaning this test can be used) for the duration of the COVID-19 declaration under Section  564(b)(1) of the Act, 21 U.S.C. section 360bbb-3(b)(1), unless the authorization is terminated or revoked.  Performed at [facility], [address] [place], [place] [address]   Blood Culture (routine x 2)     Status: None (Preliminary result)   Collection Time: 06/20/23  5:39 PM   Specimen: Right Antecubital; Blood  Result Value Ref Range Status   Specimen Description   Final    RIGHT ANTECUBITAL Performed at [facility], [address], [place], [place] [address]    Special Requests   Final    BOTTLES DRAWN AEROBIC AND ANAEROBIC Blood Culture results may not be optimal due to an inadequate volume of blood received in culture bottles Performed at [facility], [address], [place], [place] [address]    Culture   Final    NO GROWTH 3 DAYS Performed at [facility], [address] [place], [place] [address]    Report Status PENDING  Incomplete  Blood Culture (routine x 2)     Status: None (Preliminary result)   Collection Time: 06/20/23  6:34 PM   Specimen: BLOOD LEFT HAND  Result Value Ref Range Status   Specimen Description   Final    BLOOD LEFT HAND Performed at [facility], [address], [place], [place] [address]    Special Requests   Final    BOTTLES DRAWN AEROBIC AND ANAEROBIC Blood Culture adequate volume Performed at [facility], [address], [place], [place] [address]    Culture   Final    NO GROWTH 3 DAYS Performed at [facility], [address] [place], [place] [address]    Report Status PENDING  Incomplete  Culture, blood (routine x 2) Call MD if unable to obtain prior to antibiotics being given     Status: None (Preliminary result)   Collection Time: 06/20/23  9:58 PM   Specimen: BLOOD LEFT HAND  Result Value Ref Range Status   Specimen Description   Final    BLOOD LEFT HAND Performed at [facility], [address] [place], [place]  [address]    Special Requests   Final    BOTTLES DRAWN AEROBIC ONLY Blood Culture adequate volume Performed at [facility], [address], [place], [place] [address]    Culture   Final    NO GROWTH 2 DAYS Performed at [facility], [address] [place], [place] [address]    Report Status PENDING  Incomplete    Antimicrobials: Anti-infectives (From admission, onward)    Start     Dose/Rate Route Frequency Ordered Stop   06/21/23 1800  cefTRIAXone (ROCEPHIN) 2 g in sodium chloride 0.9 % 100 mL IVPB        2 g 200 mL/hr over 30 Minutes Intravenous Every 24 hours 06/20/23 1801 06/25/23 1759   06/21/23 1800  azithromycin (ZITHROMAX) 500 mg in sodium chloride 0.9 % 250 mL IVPB        500 mg 250 mL/hr over 60 Minutes Intravenous Every 24 hours 06/20/23 1801 06/25/23 1759   06/20/23 1630  cefTRIAXone (ROCEPHIN) 1 g in sodium chloride 0.9 % 100 mL IVPB  1 g 200 mL/hr over 30 Minutes Intravenous  Once 06/20/23 1622 06/20/23 1840   06/20/23 1630  azithromycin (ZITHROMAX) 500 mg in sodium chloride 0.9 % 250 mL IVPB        500 mg 250 mL/hr over 60 Minutes Intravenous  Once 06/20/23 1622 06/20/23 2000      Culture/Microbiology    Component Value Date/Time   SDES  06/20/2023 2158    BLOOD LEFT HAND Performed at [facility], [address] [place], [place] [address]    SPECREQUEST  06/20/2023 2158    BOTTLES DRAWN AEROBIC ONLY Blood Culture adequate volume Performed at [facility], [address] [place], [place] [address]    CULT  06/20/2023 2158    NO GROWTH 2 DAYS Performed at [facility], [address], [place], [place] [address]    REPTSTATUS PENDING 06/20/2023 2158    Other culture-see note  Radiology Studies: No results found.   LOS: 3 days   Lanae Boast, MD [facility]  06/23/2023, 11:58 AM

## 2023-06-23 NOTE — Care Management Important Message (Signed)
Important Message  Patient Details IM Letter given. Name: Adam Tapia MRN: [id number] Date of Birth: 03-21-1923   Medicare Important Message Given:  Yes     Caren Macadam 06/23/2023, 2:49 PM

## 2023-06-23 NOTE — Progress Notes (Signed)
[facility] Progress Note   Subjective:   up to chair, having hiccups but denies any dizziness, confusion, HA.  Eating well, says trying to push fluids.    Objective Vitals:   06/22/23 1404 06/22/23 1945 06/23/23 0335 06/23/23 1155  BP: 133/86 137/78  105/61  Pulse: 65 65  67  Resp: 17 20  16   Temp: 98.3 F (36.8 C) 99.4 F (37.4 C)  97.6 F (36.4 C)  TempSrc:    Oral  SpO2: 99% 96%  100%  Weight:   86.8 kg   Height:       Physical Exam Gen: well appearing 89yo man  Eyes: anicteric ENT: MMM Neck: supple, no JVD CV:  RRR Abd: soft Back: clear ant GU: no foley, amber urine in male purewick can Extr:  no edema Neuro: AOx3, conversational  Additional Objective Labs: Basic Metabolic Panel: Recent Labs  Lab 06/22/23 0519 06/22/23 1347 06/23/23 0525  NA 123* 124* 123*  K 3.2* 3.8 3.4*  CL 94* 94* 94*  CO2 20* 20* 19*  GLUCOSE 107* 117* 96  BUN 15 16 18   CREATININE 1.26* 1.36* 1.21  CALCIUM 8.6* 8.7* 8.4*   Liver Function Tests: Recent Labs  Lab 06/20/23 1410 06/21/23 0806  AST 24 39  ALT 13 18  ALKPHOS 33* 36*  BILITOT 0.6 0.4  PROT 6.9 7.4  ALBUMIN 2.7* 2.7*   No results for input(s): "LIPASE", "AMYLASE" in the last 168 hours. CBC: Recent Labs  Lab 06/20/23 1410 06/21/23 0806 06/22/23 0519 06/23/23 0525  WBC 6.4 5.9 4.0 3.4*  NEUTROABS 4.7  --   --   --   HGB 10.1* 12.1* 10.2* 10.6*  HCT 28.6* 34.0* 28.8* 30.4*  MCV 123.3* 119.3* 124.1* 122.1*  PLT 154 169 145* 141*   Blood Culture    Component Value Date/Time   SDES  06/20/2023 2158    BLOOD LEFT HAND Performed at [facility], [address] [place], [place] [address]    SPECREQUEST  06/20/2023 2158    BOTTLES DRAWN AEROBIC ONLY Blood Culture adequate volume Performed at [facility], [address] [place], [place] [address]    CULT  06/20/2023 2158    NO GROWTH 2 DAYS Performed at [facility], [address], [place], [place]  [address]    REPTSTATUS PENDING 06/20/2023 2158    Cardiac Enzymes: No results for input(s): "CKTOTAL", "CKMB", "CKMBINDEX", "TROPONINI" in the last 168 hours. CBG: No results for input(s): "GLUCAP" in the last 168 hours. Iron Studies:  Recent Labs    06/21/23 0057  IRON 11*  TIBC 186*  FERRITIN 495*   @lablastinr3 @ Studies/Results: No results found. Medications:  azithromycin 500 mg (06/22/23 1802)   cefTRIAXone (ROCEPHIN)  IV 2 g (06/22/23 1710)    acetaminophen  650 mg Oral Once   acidophilus  2 capsule Oral Daily   amLODipine  5 mg Oral Daily   atenolol  25 mg Oral Daily   vitamin B-12  500 mcg Oral Daily   feeding supplement  237 mL Oral BID BM   hydroxyurea  500 mg Oral BID   pantoprazole  40 mg Oral Daily   polyethylene glycol  17 g Oral Daily   potassium chloride  40 mEq Oral BID    Assessment/Plan **CAP: on CTX and azithromycin. Afebrile and on RA.    **Hyponatremia:  Initially thought hypovolemic and given IVF but sodium worse, labs with Uosm 624 and urine sodium 46  and SIADH was suspected (in setting of PNA). TSH normal.  Placed on fluid restriction and this AM serum sodium worse -- last PM labs showing urine sodium 10 and urine osm 703 - concern with the low urine sodium he's gotten a bit dry so gave LR this AM.  Tells me now he's trying to push water intake --> reviewed fluid restriction with him.  1400 BMP ordered.   No 3% saline indicated at this time.    **Hypokalemia: KCl 40 given this AM   **Myeloproliferative DO complicated by anemia:  f/b onc out pt, on hydrea.    **A fib: rate controlled, eliquis being reconsidered.  **HTN: bp fine on home meds. 130-150s in past 24h but last check 105 - add hold parameters.    Will follow, reach out with concerns.   Estill Bakes MD 06/23/2023, 12:28 PM  [facility] Pager: [phone]

## 2023-06-23 NOTE — Progress Notes (Signed)
Physical Therapy Treatment Patient Details Name: Adam Tapia MRN: [id number] DOB: Apr 17, 1923 Today's Date: 06/23/2023   History of Present Illness 89 y.o. male with medical history significant of   Atrial fibrillation, Hypertension,Myeloproliferative disorder, GERD, constipation ,unsteady gait ,psoriasis,CKDIIIb-IV who presents to ED BIBEMS  with complaint of multiple falls over the last 2 days. Patient has per family been weaker over the last few days with recurrent falls.  Patient per family had fall on day prior to admission that was mechanical with  head strike resulting in abrasion on left side of his nose. Of note family states patient has had increase stools x 1 month. Dx of PNA.    PT Comments  Pt assisted OOB to recliner and currently requiring mod-max assist for bed mobility and transfers.  Patient will benefit from continued inpatient follow up therapy, <3 hours/day.      If plan is discharge home, recommend the following: A lot of help with walking and/or transfers;A lot of help with bathing/dressing/bathroom;Assistance with cooking/housework;Help with stairs or ramp for entrance   Can travel by private vehicle     No  Equipment Recommendations  Rolling walker (2 wheels)    Recommendations for Other Services       Precautions / Restrictions Precautions Precautions: Fall     Mobility  Bed Mobility Overal bed mobility: Needs Assistance Bed Mobility: Supine to Sit     Supine to sit: Mod assist, HOB elevated     General bed mobility comments: cues for self assist, pt requiring assist for trunk upright and scooting to EOB    Transfers Overall transfer level: Needs assistance Equipment used: Rolling walker (2 wheels) Transfers: Sit to/from Stand, Bed to chair/wheelchair/BSC Sit to Stand: From elevated surface, Mod assist, Max assist Stand pivot transfers: Mod assist         General transfer comment: required increased assist to rise and steady, max cues for  positioning, weight shifting, technique; pt with decreased anterior weight shift to rise and posterior lean, requiring increased assist, also cues for pushing down on RW    Ambulation/Gait                   Stairs             Wheelchair Mobility     Tilt Bed    Modified Rankin (Stroke Patients Only)       Balance Overall balance assessment: History of Falls, Needs assistance         Standing balance support: Bilateral upper extremity supported, During functional activity, Reliant on assistive device for balance Standing balance-Leahy Scale: Zero                              Cognition Arousal/Alertness: Awake/alert Behavior During Therapy: WFL for tasks assessed/performed Overall Cognitive Status: No family/caregiver present to determine baseline cognitive functioning                                 General Comments: appropriately responds to questions but appears slow to process and initiate        Exercises      General Comments        Pertinent Vitals/Pain Pain Assessment Pain Assessment: No/denies pain    Home Living  Prior Function            PT Goals (current goals can now be found in the care plan section) Progress towards PT goals: Progressing toward goals    Frequency    Min 1X/week      PT Plan Current plan remains appropriate    Co-evaluation              AM-PAC PT "6 Clicks" Mobility   Outcome Measure  Help needed turning from your back to your side while in a flat bed without using bedrails?: A Lot Help needed moving from lying on your back to sitting on the side of a flat bed without using bedrails?: A Lot Help needed moving to and from a bed to a chair (including a wheelchair)?: A Lot Help needed standing up from a chair using your arms (e.g., wheelchair or bedside chair)?: A Lot Help needed to walk in hospital room?: Total Help needed climbing  3-5 steps with a railing? : Total 6 Click Score: 10    End of Session Equipment Utilized During Treatment: Gait belt Activity Tolerance: Patient tolerated treatment well Patient left: in chair;with chair alarm set;with call bell/phone within reach   PT Visit Diagnosis: History of falling (Z91.81);Other abnormalities of gait and mobility (R26.89)     Time: 8119-1478 PT Time Calculation (min) (ACUTE ONLY): 13 min  Charges:    $Therapeutic Activity: 8-22 mins PT General Charges $$ ACUTE PT VISIT: 1 Visit                    Thomasene Mohair PT, DPT Physical Therapist Acute Rehabilitation Services Office: [phone]    Kati L Payson 06/23/2023, 1:15 PM

## 2023-06-23 NOTE — Plan of Care (Signed)
  Problem: Respiratory: Goal: Ability to maintain adequate ventilation will improve Outcome: Progressing   Problem: Respiratory: Goal: Ability to maintain a clear airway will improve Outcome: Progressing   Problem: Clinical Measurements: Goal: Ability to maintain a body temperature in the normal range will improve Outcome: Progressing

## 2023-06-24 ENCOUNTER — Inpatient Hospital Stay (HOSPITAL_COMMUNITY): Payer: Medicare Other

## 2023-06-24 DIAGNOSIS — D471 Chronic myeloproliferative disease: Secondary | ICD-10-CM | POA: Diagnosis not present

## 2023-06-24 DIAGNOSIS — J189 Pneumonia, unspecified organism: Secondary | ICD-10-CM | POA: Diagnosis not present

## 2023-06-24 DIAGNOSIS — N1832 Chronic kidney disease, stage 3b: Secondary | ICD-10-CM | POA: Insufficient documentation

## 2023-06-24 DIAGNOSIS — E871 Hypo-osmolality and hyponatremia: Secondary | ICD-10-CM

## 2023-06-24 NOTE — Progress Notes (Signed)
Progress Note    Adam Tapia   WUX:[id number]  DOB: 1923/11/20  DOA: 06/20/2023     4 PCP: Fleet Contras, MD  Initial CC: weakness, falls  Hospital Course: Adam Tapia is a 89 yo male with PMH MPD, afib, HTN, GERD, CKD3/4, psoriasis, unsteady gait who was brought to the hospital after recurrent falls and weakness.  Imaging studies were negative for fractures or injury. CXR noted mild opacity in the right middle and lower lung.  He was started on antibiotics for pneumonia treatment. Further workup also revealed hyponatremia, 127.  He was admitted for further workup for etiology and nephrology evaluation.  Interval History:  No events overnight.  Feels that his weakness has improved since being in the hospital.  Essentially says he feels back to his normal self.  No other concerns this morning.  Assessment and Plan: * Hyponatremia - Na 127 on admission; downtrend to 123  - urine osmo 624 and repeated, 703 - urine Na 46 and repeated, 10 - with osmo that high, SIADH does fit the best it seems; esp with worsening Na after given IVF initially - etiology possibly PNA but also concerned for possible lung mass given CXR appearance; obtaining CT chest today as well - nephrology following - currently on Ure-na  CAP (community acquired pneumonia) - possible that this contributed to his weakness/falls; CXR noted opacity in RML/RLL - appearance of CXR doesn't match his mild symptoms, esp in setting of history of MPD - will obtain CT chest to rule out lung mass which could also be contributing to an SIADH picture too  Myeloproliferative disorder (HCC) - followed outpt by Dr. Arbutus Ped - on hydroxyurea at home   Chronic kidney disease, stage 3b [facility]) - patient has history of CKD3b. Baseline creat ~ 1.4, eGFR~ 35 - renal function currently better than baseline    Essential hypertension - Continue amlodipine, atenolol   Old records reviewed in assessment of this  patient  Antimicrobials:   DVT prophylaxis:  SCDs Start: 06/20/23 1759   Code Status:   Code Status: Full Code  Mobility Assessment (Last 72 Hours)     Mobility Assessment     Row Name 06/24/23 1102 06/23/23 2100 06/23/23 1313 06/23/23 0800 06/22/23 2000   Does patient have an order for bedrest or is patient medically unstable No - Continue assessment No - Continue assessment -- No - Continue assessment No - Continue assessment   What is the highest level of mobility based on the progressive mobility assessment? Level 3 (Stands with assist) - Balance while standing  and cannot march in place Level 2 (Chairfast) - Balance while sitting on edge of bed and cannot stand Level 3 (Stands with assist) - Balance while standing  and cannot march in place Level 2 (Chairfast) - Balance while sitting on edge of bed and cannot stand Level 2 (Chairfast) - Balance while sitting on edge of bed and cannot stand   Is the above level different from baseline mobility prior to current illness? Yes - Recommend PT order Yes - Recommend PT order -- Yes - Recommend PT order Yes - Recommend PT order    Row Name 06/22/23 1420 06/21/23 2059 06/21/23 1538 06/21/23 1424     Does patient have an order for bedrest or is patient medically unstable No - Continue assessment No - Continue assessment No - Continue assessment --    What is the highest level of mobility based on the progressive mobility assessment? Level 5 (Walks with  assist in room/hall) - Balance while stepping forward/back and can walk in room with assist - Complete Level 5 (Walks with assist in room/hall) - Balance while stepping forward/back and can walk in room with assist - Complete Level 5 (Walks with assist in room/hall) - Balance while stepping forward/back and can walk in room with assist - Complete Level 5 (Walks with assist in room/hall) - Balance while stepping forward/back and can walk in room with assist - Complete    Is the above level different  from baseline mobility prior to current illness? Yes - Recommend PT order Yes - Recommend PT order Yes - Recommend PT order --             Barriers to discharge: none Disposition Plan:  Home Status is: Inpt  Objective: Blood pressure (!) 145/73, pulse 62, temperature 97.7 F (36.5 C), resp. rate 18, height 5\' 8"  (1.727 m), weight 91.9 kg, SpO2 95%.  Examination:  Physical Exam Constitutional:      General: He is not in acute distress.    Appearance: Normal appearance.  HENT:     Head: Normocephalic and atraumatic.     Mouth/Throat:     Mouth: Mucous membranes are moist.  Eyes:     Extraocular Movements: Extraocular movements intact.  Cardiovascular:     Rate and Rhythm: Normal rate and regular rhythm.  Pulmonary:     Effort: Pulmonary effort is normal. No respiratory distress.     Breath sounds: Normal breath sounds. No wheezing.  Abdominal:     General: Bowel sounds are normal. There is no distension.     Palpations: Abdomen is soft.     Tenderness: There is no abdominal tenderness.  Musculoskeletal:        General: Normal range of motion.     Cervical back: Normal range of motion and neck supple.  Skin:    General: Skin is warm and dry.  Neurological:     General: No focal deficit present.     Mental Status: He is alert.  Psychiatric:        Mood and Affect: Mood normal.        Behavior: Behavior normal.      Consultants:  Nephrology   Procedures:    Data Reviewed: Results for orders placed or performed during the hospital encounter of 06/20/23 (from the past 24 hour(s))  Basic metabolic panel     Status: Abnormal   Collection Time: 06/23/23  5:22 PM  Result Value Ref Range   Sodium 123 (L) 135 - 145 mmol/L   Potassium 4.5 3.5 - 5.1 mmol/L   Chloride 94 (L) 98 - 111 mmol/L   CO2 20 (L) 22 - 32 mmol/L   Glucose, Bld 128 (H) 70 - 99 mg/dL   BUN 19 8 - 23 mg/dL   Creatinine, Ser 1.61 0.61 - 1.24 mg/dL   Calcium 8.5 (L) 8.9 - 10.3 mg/dL   GFR,  Estimated 53 (L) >60 mL/min   Anion gap 9 5 - 15  Basic metabolic panel     Status: Abnormal   Collection Time: 06/24/23  8:02 AM  Result Value Ref Range   Sodium 127 (L) 135 - 145 mmol/L   Potassium 3.8 3.5 - 5.1 mmol/L   Chloride 97 (L) 98 - 111 mmol/L   CO2 20 (L) 22 - 32 mmol/L   Glucose, Bld 89 70 - 99 mg/dL   BUN 36 (H) 8 - 23 mg/dL   Creatinine, Ser 0.96 0.61 -  1.24 mg/dL   Calcium 8.9 8.9 - 16.1 mg/dL   GFR, Estimated >09 >60 mL/min   Anion gap 10 5 - 15  CBC     Status: Abnormal   Collection Time: 06/24/23  8:02 AM  Result Value Ref Range   WBC 3.3 (L) 4.0 - 10.5 K/uL   RBC 2.61 (L) 4.22 - 5.81 MIL/uL   Hemoglobin 11.3 (L) 13.0 - 17.0 g/dL   HCT 45.4 (L) 09.8 - 11.9 %   MCV 124.1 (H) 80.0 - 100.0 fL   MCH 43.3 (H) 26.0 - 34.0 pg   MCHC 34.9 30.0 - 36.0 g/dL   RDW 14.7 82.9 - 56.2 %   Platelets 140 (L) 150 - 400 K/uL   nRBC 0.0 0.0 - 0.2 %    I have reviewed pertinent nursing notes, vitals, labs, and images as necessary. I have ordered labwork to follow up on as indicated.  I have reviewed the last notes from staff over past 24 hours. I have discussed patient's care plan and test results with nursing staff, CM/SW, and other staff as appropriate.  Time spent: Greater than 50% of the 55 minute visit was spent in counseling/coordination of care for the patient as laid out in the A&P.   LOS: 4 days   Lewie Chamber, MD [facility] 06/24/2023, 12:35 PM

## 2023-06-24 NOTE — Assessment & Plan Note (Signed)
-   Na 127 on admission; downtrend to 123  - urine osmo 624 and repeated, 703 - urine Na 46 and repeated, 10 - with osmo that high, SIADH does fit the best it seems; esp with worsening Na after given IVF initially - etiology possibly PNA but also concerned for possible lung mass given CXR appearance; obtaining CT chest today as well - nephrology following - currently on Ure-na

## 2023-06-24 NOTE — Plan of Care (Signed)

## 2023-06-24 NOTE — Plan of Care (Signed)
?  Problem: Clinical Measurements: ?Goal: Will remain free from infection ?Outcome: Progressing ?  ?Problem: Skin Integrity: ?Goal: Risk for impaired skin integrity will decrease ?Outcome: Progressing ?  ?Problem: Safety: ?Goal: Ability to remain free from injury will improve ?Outcome: Progressing ?  ?

## 2023-06-24 NOTE — Assessment & Plan Note (Signed)
-   patient has history of CKD3b. Baseline creat ~ 1.4, eGFR~ 35 - renal function currently better than baseline

## 2023-06-24 NOTE — Progress Notes (Signed)
Adam Tapia Progress Note   Subjective:   wife and granddaughter bedside this AM.  Adam Tapia feeling improved - po intake good, no HA, dizziness.  No issues urinating. Able to tolerate urea which was added yesterday PM.   Objective Vitals:   06/23/23 1155 06/23/23 2136 06/24/23 0500 06/24/23 0534  BP: 105/61 135/74  (!) 145/73  Pulse: 67 62  62  Resp: 16 20  18   Temp: 97.6 F (36.4 C) 98.8 F (37.1 C)  97.7 F (36.5 C)  TempSrc: Oral     SpO2: 100% 98%  95%  Weight:   91.9 kg   Height:       Physical Exam Gen: well appearing 89yo man  Eyes: anicteric ENT: MMM Neck: supple, no JVD CV:  RRR Abd: soft Back: clear ant GU: no foley, amber urine in male purewick can Extr:  no edema Neuro: AOx3, conversational  Additional Objective Labs: Basic Metabolic Panel: Recent Labs  Lab 06/23/23 0525 06/23/23 1722 06/24/23 0802  NA 123* 123* 127*  K 3.4* 4.5 3.8  CL 94* 94* 97*  CO2 19* 20* 20*  GLUCOSE 96 128* 89  BUN 18 19 36*  CREATININE 1.21 1.24 1.03  CALCIUM 8.4* 8.5* 8.9   Liver Function Tests: Recent Labs  Lab 06/20/23 1410 06/21/23 0806  AST 24 39  ALT 13 18  ALKPHOS 33* 36*  BILITOT 0.6 0.4  PROT 6.9 7.4  ALBUMIN 2.7* 2.7*   No results for input(s): "LIPASE", "AMYLASE" in the last 168 hours. CBC: Recent Labs  Lab 06/20/23 1410 06/21/23 0806 06/22/23 0519 06/23/23 0525 06/24/23 0802  WBC 6.4 5.9 4.0 3.4* 3.3*  NEUTROABS 4.7  --   --   --   --   HGB 10.1* 12.1* 10.2* 10.6* 11.3*  HCT 28.6* 34.0* 28.8* 30.4* 32.4*  MCV 123.3* 119.3* 124.1* 122.1* 124.1*  PLT 154 169 145* 141* 140*   Blood Culture    Component Value Date/Time   SDES  06/20/2023 2158    BLOOD LEFT HAND Performed at [facility], [address] [place], [place] [address]    SPECREQUEST  06/20/2023 2158    BOTTLES DRAWN AEROBIC ONLY Blood Culture adequate volume Performed at [facility], [address], [place], [place] [address]    CULT   06/20/2023 2158    NO GROWTH 3 DAYS Performed at [facility], [address] [place], [place] [address]    REPTSTATUS PENDING 06/20/2023 2158    Cardiac Enzymes: No results for input(s): "CKTOTAL", "CKMB", "CKMBINDEX", "TROPONINI" in the last 168 hours. CBG: No results for input(s): "GLUCAP" in the last 168 hours. Iron Studies:  No results for input(s): "IRON", "TIBC", "TRANSFERRIN", "FERRITIN" in the last 72 hours.  @lablastinr3 @ Studies/Results: No results found. Medications:  azithromycin 500 mg (06/23/23 1700)   cefTRIAXone (ROCEPHIN)  IV 2 g (06/23/23 1841)    acetaminophen  650 mg Oral Once   acidophilus  2 capsule Oral Daily   amLODipine  5 mg Oral Daily   atenolol  25 mg Oral Daily   vitamin B-12  500 mcg Oral Daily   feeding supplement  237 mL Oral BID BM   hydroxyurea  500 mg Oral BID   pantoprazole  40 mg Oral Daily   polyethylene glycol  17 g Oral Daily   urea  30 g Oral BID    Assessment/Plan **CAP: on CTX and azithromycin. Afebrile and on RA.    **Hyponatremia:  Initially  thought hypovolemic and given IVF but sodium worse, labs with Uosm 624 and urine sodium 46 and SIADH was suspected (in setting of PNA). TSH normal.  Placed on fluid restriction and then serum sodium worse with urine sodium 10 and urine osm 703 - concern with the low urine sodium Adam Tapia gotten a bit dry so gave LR yest AM but no improvement in Na (123) supporting SIADH.  Added urea 30g BID and serum sodium improved to 127 this AM.   No 3% saline indicated at this time. Cont fluid restriction.  Remain inpt for labs in AM to ensure stable/improving.    **Hypokalemia: resolved with repletion   **Myeloproliferative DO complicated by anemia:  f/b onc out pt, on hydrea.    **A fib: rate controlled, eliquis being reconsidered.  **HTN: bp fine on home meds.    Will follow, reach out with concerns.   Estill Bakes MD 06/24/2023, 12:18 PM  [name] Pager: [phone]

## 2023-06-24 NOTE — Assessment & Plan Note (Signed)
-   followed outpt by Dr. Arbutus Ped - on hydroxyurea at home

## 2023-06-24 NOTE — Assessment & Plan Note (Signed)
Continue amlodipine, atenolol

## 2023-06-24 NOTE — Assessment & Plan Note (Signed)
-   possible that this contributed to his weakness/falls; CXR noted opacity in RML/RLL - appearance of CXR doesn't match his mild symptoms, esp in setting of history of MPD - will obtain CT chest to rule out lung mass which could also be contributing to an SIADH picture too

## 2023-06-25 ENCOUNTER — Encounter (HOSPITAL_COMMUNITY): Payer: Self-pay | Admitting: Internal Medicine

## 2023-06-25 ENCOUNTER — Inpatient Hospital Stay (HOSPITAL_COMMUNITY): Payer: Medicare Other

## 2023-06-25 DIAGNOSIS — J189 Pneumonia, unspecified organism: Secondary | ICD-10-CM | POA: Diagnosis not present

## 2023-06-25 DIAGNOSIS — E871 Hypo-osmolality and hyponatremia: Secondary | ICD-10-CM | POA: Diagnosis not present

## 2023-06-25 LAB — BASIC METABOLIC PANEL WITH GFR
Anion gap: 8 (ref 5–15)
BUN: 47 mg/dL — ABNORMAL HIGH (ref 8–23)
CO2: 24 mmol/L (ref 22–32)
Calcium: 9.2 mg/dL (ref 8.9–10.3)
Chloride: 96 mmol/L — ABNORMAL LOW (ref 98–111)
Creatinine, Ser: 1.05 mg/dL (ref 0.61–1.24)
GFR, Estimated: >60 mL/min (ref >60)
Glucose, Bld: 93 mg/dL (ref 70–99)
Potassium: 3.5 mmol/L (ref 3.5–5.1)
Sodium: 128 mmol/L — ABNORMAL LOW (ref 135–145)

## 2023-06-25 LAB — CBC WITH DIFFERENTIAL/PLATELET
Abs Immature Granulocytes: 0.05 K/uL (ref 0.00–0.07)
Basophils Absolute: 0 K/uL (ref 0.0–0.1)
Basophils Relative: 0 %
Eosinophils Absolute: 0 K/uL (ref 0.0–0.5)
Eosinophils Relative: 1 %
HCT: 32.1 % — ABNORMAL LOW (ref 39.0–52.0)
Hemoglobin: 11.5 g/dL — ABNORMAL LOW (ref 13.0–17.0)
Immature Granulocytes: 2 %
Lymphocytes Relative: 48 %
Lymphs Abs: 1.3 K/uL (ref 0.7–4.0)
MCH: 42.1 pg — ABNORMAL HIGH (ref 26.0–34.0)
MCHC: 35.8 g/dL (ref 30.0–36.0)
MCV: 117.6 fL — ABNORMAL HIGH (ref 80.0–100.0)
Monocytes Absolute: 0.5 K/uL (ref 0.1–1.0)
Monocytes Relative: 19 %
Neutro Abs: 0.8 K/uL — ABNORMAL LOW (ref 1.7–7.7)
Neutrophils Relative %: 30 %
Platelets: 165 K/uL (ref 150–400)
RBC: 2.73 MIL/uL — ABNORMAL LOW (ref 4.22–5.81)
RDW: 13.8 % (ref 11.5–15.5)
WBC: 2.6 K/uL — ABNORMAL LOW (ref 4.0–10.5)
nRBC: 0 % (ref 0.0–0.2)

## 2023-06-25 MED ORDER — UREA 15 G PO PACK: 30.0000 g | PACK | Freq: Two times a day (BID) | ORAL | 0 refills | Status: AC

## 2023-06-25 MED ORDER — CYANOCOBALAMIN 500 MCG PO TABS: 500.0000 ug | ORAL_TABLET | Freq: Every day | ORAL | Status: AC

## 2023-06-25 NOTE — Plan of Care (Signed)
Pt was able to answer all orientation questions at the beginning of the shift but confused about place and situation overnight intermittently.   Problem: Activity: Goal: Ability to tolerate increased activity will improve Outcome: Progressing   Problem: Clinical Measurements: Goal: Ability to maintain a body temperature in the normal range will improve Outcome: Progressing   Problem: Respiratory: Goal: Ability to maintain adequate ventilation will improve Outcome: Progressing Goal: Ability to maintain a clear airway will improve Outcome: Progressing   Problem: Education: Goal: Knowledge of General Education information will improve Description: Including pain rating scale, medication(s)/side effects and non-pharmacologic comfort measures Outcome: Progressing   Problem: Health Behavior/Discharge Planning: Goal: Ability to manage health-related needs will improve Outcome: Progressing

## 2023-06-25 NOTE — Progress Notes (Signed)
Oral Chemo Policy - Hydrea  Labs: ANC 800  Hydroxyurea (Hydrea) hold criteria: ANC < 2K Pltc < 80K  Hgb <= 6 g/dL Reticulocytes < 40J when Hgb < 9 g/dL  A/P: ANC continues to drop, will hold Hydrea. Continue to monitor for changes  Hessie Knows, PharmD, BCPS Secure Chat if ?s 06/25/2023 10:50 AM

## 2023-06-25 NOTE — Plan of Care (Signed)
  Problem: Activity: Goal: Ability to tolerate increased activity will improve 06/25/2023 1450 by , Stormy Card, RN Outcome: Adequate for Discharge 06/25/2023 1407 by , Stormy Card, RN Outcome: Progressing   Problem: Clinical Measurements: Goal: Ability to maintain a body temperature in the normal range will improve 06/25/2023 1450 by , Stormy Card, RN Outcome: Adequate for Discharge 06/25/2023 1407 by Cathlean Cower, RN Outcome: Progressing   Problem: Respiratory: Goal: Ability to maintain adequate ventilation will improve 06/25/2023 1450 by , Stormy Card, RN Outcome: Adequate for Discharge 06/25/2023 1407 by Cathlean Cower, RN Outcome: Progressing Goal: Ability to maintain a clear airway will improve 06/25/2023 1450 by , Stormy Card, RN Outcome: Adequate for Discharge 06/25/2023 1407 by , Stormy Card, RN Outcome: Progressing   Problem: Education: Goal: Knowledge of General Education information will improve Description: Including pain rating scale, medication(s)/side effects and non-pharmacologic comfort measures 06/25/2023 1450 by Cathlean Cower, RN Outcome: Adequate for Discharge 06/25/2023 1407 by , Stormy Card, RN Outcome: Progressing   Problem: Health Behavior/Discharge Planning: Goal: Ability to manage health-related needs will improve Outcome: Adequate for Discharge   Problem: Clinical Measurements: Goal: Ability to maintain clinical measurements within normal limits will improve 06/25/2023 1450 by , Stormy Card, RN Outcome: Adequate for Discharge 06/25/2023 1407 by , Stormy Card, RN Outcome: Progressing Goal: Will remain free from infection Outcome: Adequate for Discharge Goal: Diagnostic test results will improve Outcome: Adequate for Discharge Goal: Respiratory complications will improve Outcome: Adequate for Discharge Goal: Cardiovascular complication will be avoided Outcome: Adequate for Discharge    Problem: Activity: Goal: Risk for activity intolerance will decrease 06/25/2023 1450 by , Stormy Card, RN Outcome: Adequate for Discharge 06/25/2023 1407 by Cathlean Cower, RN Outcome: Progressing   Problem: Nutrition: Goal: Adequate nutrition will be maintained 06/25/2023 1450 by , Stormy Card, RN Outcome: Adequate for Discharge 06/25/2023 1407 by , Stormy Card, RN Outcome: Progressing   Problem: Coping: Goal: Level of anxiety will decrease Outcome: Adequate for Discharge   Problem: Elimination: Goal: Will not experience complications related to bowel motility Outcome: Adequate for Discharge Goal: Will not experience complications related to urinary retention Outcome: Adequate for Discharge   Problem: Pain Managment: Goal: General experience of comfort will improve Outcome: Adequate for Discharge   Problem: Safety: Goal: Ability to remain free from injury will improve 06/25/2023 1450 by , Stormy Card, RN Outcome: Adequate for Discharge 06/25/2023 1407 by Cathlean Cower, RN Outcome: Progressing   Problem: Skin Integrity: Goal: Risk for impaired skin integrity will decrease 06/25/2023 1450 by , Stormy Card, RN Outcome: Adequate for Discharge 06/25/2023 1407 by Cathlean Cower, RN Outcome: Progressing

## 2023-06-25 NOTE — Discharge Summary (Signed)
Physician Discharge Summary   Adam Tapia DGL:[id number] DOB: 12/03/1922 DOA: 06/20/2023  PCP: Adam Contras, MD  Admit date: 06/20/2023 Discharge date: 06/26/2023  Admitted From: Home Disposition:  Home Discharging physician: Lewie Chamber, MD Barriers to discharge: none  Recommendations at discharge: Consider repeat CT chest in 3 months to evaluate for underlying mass especially if hyponatremia difficult to improve Repeat BMP at follow up  Discharge Condition: stable CODE STATUS: Full  Diet recommendation:  Diet Orders (From admission, onward)     Start     Ordered   06/26/23 0000  Diet - low sodium heart healthy        06/26/23 0737   06/25/23 0000  Diet - low sodium heart healthy        06/25/23 1430   06/23/23 1053  Diet Heart Room service appropriate? Yes; Fluid consistency: Thin; Fluid restriction: 1200 mL Fluid  Diet effective now       Question Answer Comment  Room service appropriate? Yes   Fluid consistency: Thin   Fluid restriction: 1200 mL Fluid      06/23/23 1052            Hospital Course: Mr. Adam Tapia is a 89 yo male with PMH MPD, afib, HTN, GERD, CKD3/4, psoriasis, unsteady gait who was brought to the hospital after recurrent falls and weakness.  Imaging studies were negative for fractures or injury. CXR noted mild opacity in the right middle and lower lung.  He was started on antibiotics for pneumonia treatment. Further workup also revealed hyponatremia, 127.  He was admitted for further workup for etiology and nephrology evaluation.  Assessment and Plan: * Hyponatremia - Na 127 on admission; downtrend to 123  - urine osmo 624 and repeated, 703 - urine Na 46 and repeated, 10 - with osmo that high, SIADH does fit the best it seems; esp with worsening Na after given IVF initially - etiology possibly PNA but also concerned for possible lung mass given CXR appearance; obtaining CT chest (too much infiltrate to delineate mass)  -Followed by  nephrology during hospitalization.  Continued on Ure-na at discharge for 3 weeks -Needs repeat BMP with PCP at follow-up  CAP (community acquired pneumonia) - completed 5 day course of rocephin and azithro inpatient  - possible that this contributed to his weakness/falls; CXR noted opacity in RML/RLL - appearance of CXR doesn't match his mild symptoms, esp in setting of history of MPD - will obtain CT chest to rule out lung mass which could also be contributing to an SIADH picture too - per CT chest: "Peripheral consolidation of the right lower and right upper lobes with surrounding ground-glass opacities likely reflect pneumonia, however an underlying mass lesion is difficult to exclude. Recommend follow-up CT in 3 months to ensure resolution."  Myeloproliferative disorder (HCC) - followed outpt by Dr. Arbutus Ped - on hydroxyurea at home   Chronic kidney disease, stage 3b [facility]) - patient has history of CKD3b. Baseline creat ~ 1.4, eGFR~ 35 - renal function currently better than baseline    Essential hypertension - Continue amlodipine, atenolol   The patient's chronic medical conditions were treated accordingly per the patient's home medication regimen except as noted.  On day of discharge, patient was felt deemed stable for discharge. Patient/family member advised to call PCP or come back to ER if needed.   Principal Diagnosis: Hyponatremia  Discharge Diagnoses: Active Hospital Problems   Diagnosis Date Noted   Hyponatremia 06/24/2023    Priority: 1.   CAP (  community acquired pneumonia) 06/20/2023    Priority: 2.   Myeloproliferative disorder (HCC) 08/27/2014    Priority: 3.   Chronic kidney disease, stage 3b (HCC) 06/24/2023   Essential hypertension 01/17/2023    Resolved Hospital Problems  No resolved problems to display.     Discharge Instructions     Diet - low sodium heart healthy   Complete by: As directed    Diet - low sodium heart healthy   Complete by: As  directed    Increase activity slowly   Complete by: As directed    Increase activity slowly   Complete by: As directed       Allergies as of 06/26/2023       Reactions   Cinnamon Swelling   Face & throat swelling   Nsaids Other (See Comments)        Medication List     STOP taking these medications    promethazine-dextromethorphan 6.25-15 MG/5ML syrup Commonly known as: PROMETHAZINE-DM   triamterene-hydrochlorothiazide 37.5-25 MG tablet Commonly known as: MAXZIDE-25       TAKE these medications    acetaminophen 325 MG tablet Commonly known as: TYLENOL Take 650 mg by mouth daily as needed for moderate pain.   allopurinol 100 MG tablet Commonly known as: ZYLOPRIM TAKE 1 TABLET BY MOUTH EVERY DAY What changed:  when to take this reasons to take this   amLODipine 5 MG tablet Commonly known as: NORVASC Take 5 mg by mouth daily.   atenolol 25 MG tablet Commonly known as: TENORMIN Take 25 mg by mouth daily.   candesartan 8 MG tablet Commonly known as: ATACAND Take 8 mg by mouth daily.   cyanocobalamin 500 MCG tablet Commonly known as: VITAMIN B12 Take 1 tablet (500 mcg total) by mouth daily.   fexofenadine 180 MG tablet Commonly known as: ALLEGRA Take 180 mg by mouth daily.   fluticasone 50 MCG/ACT nasal spray Commonly known as: FLONASE Place 1 spray into both nostrils daily as needed for allergies or rhinitis.   hydroxyurea 500 MG capsule Commonly known as: HYDREA TAKE 1 CAPSULE BY MOUTH TWICE A DAY (MAY TAKE WITH FOOD TO MINIMIZE SIDE EFFECTS) What changed: See the new instructions.   ketotifen 0.025 % ophthalmic solution Commonly known as: ZADITOR Place 1 drop into both eyes daily as needed (itching eyes).   NON FORMULARY Apply 1 application  topically daily as needed (pain). Veterinary liniment gel   Restora Caps Take 1 capsule by mouth daily.   urea 15 g Pack oral packet Commonly known as: URE-NA Take 30 g by mouth 2 (two) times daily  for 21 days.   Voltaren 1 % Gel Generic drug: diclofenac Sodium Apply 2 g topically daily as needed (pain).               Durable Medical Equipment  (From admission, onward)           Start     Ordered   06/25/23 1625  For home use only DME Walker rolling  Once       Question Answer Comment  Walker: With 5 Inch Wheels   Patient needs a walker to treat with the following condition Generalized muscle weakness      06/25/23 1626            Follow-up Information     [facility].   Specialty: Home Health Services Why: [facility] will follow up with you at discharge to provide home health services Contact  information: [address] [place] [address] [phone]         [facility] .          Adam Contras, MD. Schedule an appointment as soon as possible for a visit in 2 week(s).   Specialty: Internal Medicine Why: Repeat BMP Contact information: [address] [place] [address] [phone]                Allergies  Allergen Reactions   Cinnamon Swelling    Face & throat swelling   Nsaids Other (See Comments)    Consultations: Nephrology   Procedures:   Discharge Exam: BP 129/69 (BP Location: Left Arm)   Pulse 65   Temp 98.1 F (36.7 C) (Oral)   Resp 20   Ht 5\' 8"  (1.727 m)   Wt 83.9 kg   SpO2 97%   BMI 28.12 kg/m  Physical Exam Constitutional:      General: He is not in acute distress.    Appearance: Normal appearance.  HENT:     Head: Normocephalic and atraumatic.     Mouth/Throat:     Mouth: Mucous membranes are moist.  Eyes:     Extraocular Movements: Extraocular movements intact.  Cardiovascular:     Rate and Rhythm: Normal rate and regular rhythm.  Pulmonary:     Effort: Pulmonary effort is normal. No respiratory distress.     Breath sounds: Normal breath sounds. No wheezing.  Abdominal:     General: Bowel sounds are normal. There is no distension.      Palpations: Abdomen is soft.     Tenderness: There is no abdominal tenderness.  Musculoskeletal:        General: Normal range of motion.     Cervical back: Normal range of motion and neck supple.  Skin:    General: Skin is warm and dry.  Neurological:     General: No focal deficit present.     Mental Status: He is alert.  Psychiatric:        Mood and Affect: Mood normal.        Behavior: Behavior normal.      The results of significant diagnostics from this hospitalization (including imaging, microbiology, ancillary and laboratory) are listed below for reference.   Microbiology: Recent Results (from the past 240 hour(s))  SARS Coronavirus 2 by RT PCR (hospital order, performed in [facility] hospital lab) *cepheid single result test* Anterior Nasal Swab     Status: None   Collection Time: 06/20/23  2:02 PM   Specimen: Anterior Nasal Swab  Result Value Ref Range Status   SARS Coronavirus 2 by RT PCR NEGATIVE NEGATIVE Final    Comment: (NOTE) SARS-CoV-2 target nucleic acids are NOT DETECTED.  The SARS-CoV-2 RNA is generally detectable in upper and lower respiratory specimens during the acute phase of infection. The lowest concentration of SARS-CoV-2 viral copies this assay can detect is 250 copies / mL. A negative result does not preclude SARS-CoV-2 infection and should not be used as the sole basis for treatment or other patient management decisions.  A negative result may occur with improper specimen collection / handling, submission of specimen other than nasopharyngeal swab, presence of viral mutation(s) within the areas targeted by this assay, and inadequate number of viral copies (<250 copies / mL). A negative result must be combined with clinical observations, patient history, and epidemiological information.  Fact Sheet for Patients:   RoadLapTop.co.za  Fact Sheet for Healthcare  Providers: http://kim-miller.com/  This test  is not yet approved or  cleared by the [place] and has been authorized for detection and/or diagnosis of SARS-CoV-2 by FDA under an Emergency Use Authorization (EUA).  This EUA will remain in effect (meaning this test can be used) for the duration of the COVID-19 declaration under Section 564(b)(1) of the Act, 21 U.S.C. section 360bbb-3(b)(1), unless the authorization is terminated or revoked sooner.  Performed at [facility], [address] [place], [place] [address]   Resp panel by RT-PCR (RSV, Flu A&B, Covid) Anterior Nasal Swab     Status: None   Collection Time: 06/20/23  2:02 PM   Specimen: Anterior Nasal Swab  Result Value Ref Range Status   SARS Coronavirus 2 by RT PCR NEGATIVE NEGATIVE Final    Comment: (NOTE) SARS-CoV-2 target nucleic acids are NOT DETECTED.  The SARS-CoV-2 RNA is generally detectable in upper respiratory specimens during the acute phase of infection. The lowest concentration of SARS-CoV-2 viral copies this assay can detect is 138 copies/mL. A negative result does not preclude SARS-Cov-2 infection and should not be used as the sole basis for treatment or other patient management decisions. A negative result may occur with  improper specimen collection/handling, submission of specimen other than nasopharyngeal swab, presence of viral mutation(s) within the areas targeted by this assay, and inadequate number of viral copies(<138 copies/mL). A negative result must be combined with clinical observations, patient history, and epidemiological information. The expected result is Negative.  Fact Sheet for Patients:  BloggerCourse.com  Fact Sheet for Healthcare Providers:  SeriousBroker.it  This test is no t yet approved or cleared by the [place] FDA and  has been authorized for detection and/or diagnosis of SARS-CoV-2 by FDA under an Emergency Use Authorization (EUA). This  EUA will remain  in effect (meaning this test can be used) for the duration of the COVID-19 declaration under Section 564(b)(1) of the Act, 21 U.S.C.section 360bbb-3(b)(1), unless the authorization is terminated  or revoked sooner.       Influenza A by PCR NEGATIVE NEGATIVE Final   Influenza B by PCR NEGATIVE NEGATIVE Final    Comment: (NOTE) The Xpert Xpress SARS-CoV-2/FLU/RSV plus assay is intended as an aid in the diagnosis of influenza from Nasopharyngeal swab specimens and should not be used as a sole basis for treatment. Nasal washings and aspirates are unacceptable for Xpert Xpress SARS-CoV-2/FLU/RSV testing.  Fact Sheet for Patients: BloggerCourse.com  Fact Sheet for Healthcare Providers: SeriousBroker.it  This test is not yet approved or cleared by the [place] FDA and has been authorized for detection and/or diagnosis of SARS-CoV-2 by FDA under an Emergency Use Authorization (EUA). This EUA will remain in effect (meaning this test can be used) for the duration of the COVID-19 declaration under Section 564(b)(1) of the Act, 21 U.S.C. section 360bbb-3(b)(1), unless the authorization is terminated or revoked.     Resp Syncytial Virus by PCR NEGATIVE NEGATIVE Final    Comment: (NOTE) Fact Sheet for Patients: BloggerCourse.com  Fact Sheet for Healthcare Providers: SeriousBroker.it  This test is not yet approved or cleared by the [place] FDA and has been authorized for detection and/or diagnosis of SARS-CoV-2 by FDA under an Emergency Use Authorization (EUA). This EUA will remain in effect (meaning this test can be used) for the duration of the COVID-19 declaration under Section 564(b)(1) of the Act, 21 U.S.C. section 360bbb-3(b)(1), unless the authorization is terminated or revoked.  Performed at [facility], [address] [place], [place] [address]   Blood Culture (routine x 2)     Status: None   Collection Time: 06/20/23  5:39 PM   Specimen: Right Antecubital; Blood  Result Value Ref Range Status   Specimen Description   Final    RIGHT ANTECUBITAL Performed at [facility], [address], [place], [place] [address]    Special Requests   Final    BOTTLES DRAWN AEROBIC AND ANAEROBIC Blood Culture results may not be optimal due to an inadequate volume of blood received in culture bottles Performed at [facility], [address], [place], [place] [address]    Culture   Final    NO GROWTH 5 DAYS Performed at [facility], [address] [place], [place] [address]    Report Status 06/25/2023 FINAL  Final  Blood Culture (routine x 2)     Status: None   Collection Time: 06/20/23  6:34 PM   Specimen: BLOOD LEFT HAND  Result Value Ref Range Status   Specimen Description   Final    BLOOD LEFT HAND Performed at [facility], [address] [place], [place] [address]    Special Requests   Final    BOTTLES DRAWN AEROBIC AND ANAEROBIC Blood Culture adequate volume Performed at [facility], [address] [place], [place] [address]    Culture   Final    NO GROWTH 5 DAYS Performed at [facility], [address] [place], [place] [address]    Report Status 06/25/2023 FINAL  Final  Culture, blood (routine x 2) Call MD if unable to obtain prior to antibiotics being given     Status: None (Preliminary result)   Collection Time: 06/20/23  9:58 PM   Specimen: BLOOD LEFT HAND  Result Value Ref Range Status   Specimen Description   Final    BLOOD LEFT HAND Performed at [facility], [address], [place], [place] [address]    Special Requests   Final    BOTTLES DRAWN AEROBIC ONLY Blood Culture adequate volume Performed at [facility], [address] [place], [place] [address]    Culture   Final     NO GROWTH 4 DAYS Performed at [facility], [address], [place], [place] [address]    Report Status PENDING  Incomplete     Labs: BNP (last 3 results) Recent Labs    06/20/23 2158  BNP 169.1*   Basic Metabolic Panel: Recent Labs  Lab 06/20/23 2158 06/21/23 0057 06/23/23 0525 06/23/23 1722 06/24/23 0802 06/25/23 0746 06/26/23 0508  NA 126*   < > 123* 123* 127* 128* 129*  K 3.5   < > 3.4* 4.5 3.8 3.5 3.3*  CL 95*   < > 94* 94* 97* 96* 97*  CO2 21*   < > 19* 20* 20* 24 25  GLUCOSE 147*   < > 96 128* 89 93 93  BUN 19   < > 18 19 36* 47* 66*  CREATININE 1.30*   < > 1.21 1.24 1.03 1.05 1.11  CALCIUM 8.6*   < > 8.4* 8.5* 8.9 9.2 9.1  MG 1.7  --   --   --   --   --   --    < > = values in this interval not displayed.   Liver Function Tests: Recent Labs  Lab 06/20/23 1410 06/21/23 0272  AST 24 39  ALT 13 18  ALKPHOS 33* 36*  BILITOT 0.6 0.4  PROT 6.9 7.4  ALBUMIN 2.7* 2.7*   No results for input(s): "LIPASE", "AMYLASE" in the last 168 hours. No results for input(s): "AMMONIA" in the last 168 hours. CBC: Recent Labs  Lab 06/20/23 1410 06/21/23 0806 06/22/23 0519 06/23/23 0525 06/24/23 0802 06/25/23 0746  WBC 6.4 5.9 4.0 3.4* 3.3* 2.6*  NEUTROABS 4.7  --   --   --   --  0.8*  HGB 10.1* 12.1* 10.2* 10.6* 11.3* 11.5*  HCT 28.6* 34.0* 28.8* 30.4* 32.4* 32.1*  MCV 123.3* 119.3* 124.1* 122.1* 124.1* 117.6*  PLT 154 169 145* 141* 140* 165   Cardiac Enzymes: No results for input(s): "CKTOTAL", "CKMB", "CKMBINDEX", "TROPONINI" in the last 168 hours. BNP: Invalid input(s): "POCBNP" CBG: No results for input(s): "GLUCAP" in the last 168 hours. D-Dimer No results for input(s): "DDIMER" in the last 72 hours. Hgb A1c No results for input(s): "HGBA1C" in the last 72 hours. Lipid Profile No results for input(s): "CHOL", "HDL", "LDLCALC", "TRIG", "CHOLHDL", "LDLDIRECT" in the last 72 hours. Thyroid function studies No results for input(s): "TSH", "T4TOTAL",  "T3FREE", "THYROIDAB" in the last 72 hours.  Invalid input(s): "FREET3" Anemia work up No results for input(s): "VITAMINB12", "FOLATE", "FERRITIN", "TIBC", "IRON", "RETICCTPCT" in the last 72 hours. Urinalysis    Component Value Date/Time   COLORURINE YELLOW 06/20/2023 1553   APPEARANCEUR HAZY (A) 06/20/2023 1553   LABSPEC 1.020 06/20/2023 1553   PHURINE 5.0 06/20/2023 1553   GLUCOSEU NEGATIVE 06/20/2023 1553   HGBUR MODERATE (A) 06/20/2023 1553   BILIRUBINUR NEGATIVE 06/20/2023 1553   KETONESUR 5 (A) 06/20/2023 1553   PROTEINUR 100 (A) 06/20/2023 1553   NITRITE NEGATIVE 06/20/2023 1553   LEUKOCYTESUR NEGATIVE 06/20/2023 1553   Sepsis Labs Recent Labs  Lab 06/22/23 0519 06/23/23 0525 06/24/23 0802 06/25/23 0746  WBC 4.0 3.4* 3.3* 2.6*   Microbiology Recent Results (from the past 240 hour(s))  SARS Coronavirus 2 by RT PCR (hospital order, performed in [facility] hospital lab) *cepheid single result test* Anterior Nasal Swab     Status: None   Collection Time: 06/20/23  2:02 PM   Specimen: Anterior Nasal Swab  Result Value Ref Range Status   SARS Coronavirus 2 by RT PCR NEGATIVE NEGATIVE Final    Comment: (NOTE) SARS-CoV-2 target nucleic acids are NOT DETECTED.  The SARS-CoV-2 RNA is generally detectable in upper and lower respiratory specimens during the acute phase of infection. The lowest concentration of SARS-CoV-2 viral copies this assay can detect is 250 copies / mL. A negative result does not preclude SARS-CoV-2 infection and should not be used as the sole basis for treatment or other patient management decisions.  A negative result may occur with improper specimen collection / handling, submission of specimen other than nasopharyngeal swab, presence of viral mutation(s) within the areas targeted by this assay, and inadequate number of viral copies (<250 copies / mL). A negative result must be combined with clinical observations, patient history, and  epidemiological information.  Fact Sheet for Patients:   RoadLapTop.co.za  Fact Sheet for Healthcare Providers: http://kim-miller.com/  This test is not yet approved or  cleared by the [place] FDA and has been authorized for detection and/or diagnosis of SARS-CoV-2 by FDA under an Emergency Use Authorization (EUA).  This EUA will remain in effect (meaning this test can be used) for the duration of the COVID-19 declaration under Section 564(b)(1) of the Act, 21 U.S.C.  section 360bbb-3(b)(1), unless the authorization is terminated or revoked sooner.  Performed at [facility], [address] [place], [place] [address]   Resp panel by RT-PCR (RSV, Flu A&B, Covid) Anterior Nasal Swab     Status: None   Collection Time: 06/20/23  2:02 PM   Specimen: Anterior Nasal Swab  Result Value Ref Range Status   SARS Coronavirus 2 by RT PCR NEGATIVE NEGATIVE Final    Comment: (NOTE) SARS-CoV-2 target nucleic acids are NOT DETECTED.  The SARS-CoV-2 RNA is generally detectable in upper respiratory specimens during the acute phase of infection. The lowest concentration of SARS-CoV-2 viral copies this assay can detect is 138 copies/mL. A negative result does not preclude SARS-Cov-2 infection and should not be used as the sole basis for treatment or other patient management decisions. A negative result may occur with  improper specimen collection/handling, submission of specimen other than nasopharyngeal swab, presence of viral mutation(s) within the areas targeted by this assay, and inadequate number of viral copies(<138 copies/mL). A negative result must be combined with clinical observations, patient history, and epidemiological information. The expected result is Negative.  Fact Sheet for Patients:  BloggerCourse.com  Fact Sheet for Healthcare Providers:   SeriousBroker.it  This test is no t yet approved or cleared by the [place] FDA and  has been authorized for detection and/or diagnosis of SARS-CoV-2 by FDA under an Emergency Use Authorization (EUA). This EUA will remain  in effect (meaning this test can be used) for the duration of the COVID-19 declaration under Section 564(b)(1) of the Act, 21 U.S.C.section 360bbb-3(b)(1), unless the authorization is terminated  or revoked sooner.       Influenza A by PCR NEGATIVE NEGATIVE Final   Influenza B by PCR NEGATIVE NEGATIVE Final    Comment: (NOTE) The Xpert Xpress SARS-CoV-2/FLU/RSV plus assay is intended as an aid in the diagnosis of influenza from Nasopharyngeal swab specimens and should not be used as a sole basis for treatment. Nasal washings and aspirates are unacceptable for Xpert Xpress SARS-CoV-2/FLU/RSV testing.  Fact Sheet for Patients: BloggerCourse.com  Fact Sheet for Healthcare Providers: SeriousBroker.it  This test is not yet approved or cleared by the [place] FDA and has been authorized for detection and/or diagnosis of SARS-CoV-2 by FDA under an Emergency Use Authorization (EUA). This EUA will remain in effect (meaning this test can be used) for the duration of the COVID-19 declaration under Section 564(b)(1) of the Act, 21 U.S.C. section 360bbb-3(b)(1), unless the authorization is terminated or revoked.     Resp Syncytial Virus by PCR NEGATIVE NEGATIVE Final    Comment: (NOTE) Fact Sheet for Patients: BloggerCourse.com  Fact Sheet for Healthcare Providers: SeriousBroker.it  This test is not yet approved or cleared by the [place] FDA and has been authorized for detection and/or diagnosis of SARS-CoV-2 by FDA under an Emergency Use Authorization (EUA). This EUA will remain in effect (meaning this test can be used) for  the duration of the COVID-19 declaration under Section 564(b)(1) of the Act, 21 U.S.C. section 360bbb-3(b)(1), unless the authorization is terminated or revoked.  Performed at [facility], [address] [place], [place] [address]   Blood Culture (routine x 2)     Status: None   Collection Time: 06/20/23  5:39 PM   Specimen: Right Antecubital; Blood  Result Value Ref Range Status   Specimen Description   Final    RIGHT ANTECUBITAL Performed at [facility], [address]  [place], [place] [address]    Special Requests   Final    BOTTLES DRAWN AEROBIC AND ANAEROBIC Blood Culture results may not be optimal due to an inadequate volume of blood received in culture bottles Performed at [facility], [address] [place], [place] [address]    Culture   Final    NO GROWTH 5 DAYS Performed at [facility], [address], [place], [place] [address]    Report Status 06/25/2023 FINAL  Final  Blood Culture (routine x 2)     Status: None   Collection Time: 06/20/23  6:34 PM   Specimen: BLOOD LEFT HAND  Result Value Ref Range Status   Specimen Description   Final    BLOOD LEFT HAND Performed at [facility], [address] [place], [place] [address]    Special Requests   Final    BOTTLES DRAWN AEROBIC AND ANAEROBIC Blood Culture adequate volume Performed at [facility], [address], [place], [place] [address]    Culture   Final    NO GROWTH 5 DAYS Performed at [facility], [address], [place], [place] [address]    Report Status 06/25/2023 FINAL  Final  Culture, blood (routine x 2) Call MD if unable to obtain prior to antibiotics being given     Status: None (Preliminary result)   Collection Time: 06/20/23  9:58 PM   Specimen: BLOOD LEFT HAND  Result Value Ref Range Status   Specimen Description   Final    BLOOD LEFT HAND Performed at [facility], [address], [place], [place] [address]    Special Requests   Final    BOTTLES DRAWN AEROBIC ONLY Blood Culture adequate volume Performed at [facility], [address] [place], [place] [address]    Culture   Final    NO GROWTH 4 DAYS Performed at [facility], [address] [place], [place] [address]    Report Status PENDING  Incomplete    Procedures/Studies: CT CHEST WO CONTRAST  Result Date: 06/25/2023 CLINICAL DATA:  Pneumonia, concern for lung mass. EXAM: CT CHEST WITHOUT CONTRAST TECHNIQUE: Multidetector CT imaging of the chest was performed following the standard protocol without IV contrast. RADIATION DOSE REDUCTION: This exam was performed according to the departmental dose-optimization program which includes automated exposure control, adjustment of the mA and/or kV according to patient size and/or use of iterative reconstruction technique. COMPARISON:  Chest radiograph dated 06/20/2023. FINDINGS: Cardiovascular: The right and left pulmonary arteries are enlarged, measuring 2.6 and 2.4 cm respectively, suggestive of pulmonary hypertension. Vascular calcifications are seen in the aortic arch. The heart is normal in size. No pericardial effusion. Mediastinum/Nodes: No enlarged mediastinal or axillary lymph nodes. Thyroid gland, trachea, and esophagus demonstrate no significant findings. Lungs/Pleura: There is a moderate right pleural effusion with associated atelectasis. There is peripheral consolidation of the right lower and right upper lobes with surrounding ground-glass opacities. An underlying mass lesion is difficult to exclude. There is mild left basilar atelectasis. There is bilateral lower lung predominant bronchiectasis. Upper Abdomen: No acute abnormality. Musculoskeletal: Degenerative changes are seen in the spine. IMPRESSION: 1. Peripheral consolidation of the right lower and right upper lobes with surrounding ground-glass opacities likely reflect pneumonia, however  an underlying mass lesion is difficult to exclude. Recommend follow-up CT in 3 months to ensure resolution. 2. Moderate right pleural effusion with associated atelectasis. 3. Enlarged pulmonary arteries suggestive of  pulmonary hypertension. Aortic Atherosclerosis (ICD10-I70.0). Electronically Signed   By: Romona Curls M.D.   On: 06/25/2023 15:00   DG Chest Port 1 View  Result Date: 06/20/2023 CLINICAL DATA:  Pain after fall EXAM: PORTABLE CHEST 1 VIEW COMPARISON:  01/17/23 FINDINGS: Mild opacity at the right mid and lower lung. No pneumothorax, effusion or edema. Normal cardiopericardial silhouette. Overlapping cardiac leads. Film is under penetrated. IMPRESSION: Mild opacity at the right mid and lower lung. Acute infiltrates possible. Recommend follow-up to confirm clearance. Electronically Signed   By: Karen Kays M.D.   On: 06/20/2023 14:23   CT Head Wo Contrast  Result Date: 06/20/2023 CLINICAL DATA:  Polytrauma, blunt EXAM: CT HEAD WITHOUT CONTRAST CT CERVICAL SPINE WITHOUT CONTRAST TECHNIQUE: Multidetector CT imaging of the head and cervical spine was performed following the standard protocol without intravenous contrast. Multiplanar CT image reconstructions of the cervical spine were also generated. RADIATION DOSE REDUCTION: This exam was performed according to the departmental dose-optimization program which includes automated exposure control, adjustment of the mA and/or kV according to patient size and/or use of iterative reconstruction technique. COMPARISON:  None Available. FINDINGS: CT HEAD FINDINGS Brain: No evidence of acute infarction, hemorrhage, hydrocephalus, extra-axial collection or mass lesion/mass effect. Sequela of mild chronic microvascular ischemic change. Generalized volume loss. Prominent subarachnoid spaces bilaterally are likely related to the degree of volume loss. Vascular: No hyperdense vessel or unexpected calcification. Skull: Normal. Negative for fracture or focal lesion.  Sinuses/Orbits: No middle ear or mastoid effusion. Paranasal sinuses are notable for complete opacification of the left frontal sinus. Orbits are unremarkable. Other: None. CT CERVICAL SPINE FINDINGS Alignment: Trace retrolisthesis of C5 on C6. Grade 1 anterolisthesis of C7 on T1. Skull base and vertebrae: No acute fracture. No primary bone lesion or focal pathologic process. Soft tissues and spinal canal: No prevertebral fluid or swelling. No visible canal hematoma. Disc levels:  No evidence of high-grade spinal canal stenosis. Upper chest: Negative. Other: None IMPRESSION: 1. No acute intracranial abnormality. 2. No acute fracture or traumatic subluxation of the cervical spine. Electronically Signed   By: Lorenza Cambridge M.D.   On: 06/20/2023 14:15   CT Cervical Spine Wo Contrast  Result Date: 06/20/2023 CLINICAL DATA:  Polytrauma, blunt EXAM: CT HEAD WITHOUT CONTRAST CT CERVICAL SPINE WITHOUT CONTRAST TECHNIQUE: Multidetector CT imaging of the head and cervical spine was performed following the standard protocol without intravenous contrast. Multiplanar CT image reconstructions of the cervical spine were also generated. RADIATION DOSE REDUCTION: This exam was performed according to the departmental dose-optimization program which includes automated exposure control, adjustment of the mA and/or kV according to patient size and/or use of iterative reconstruction technique. COMPARISON:  None Available. FINDINGS: CT HEAD FINDINGS Brain: No evidence of acute infarction, hemorrhage, hydrocephalus, extra-axial collection or mass lesion/mass effect. Sequela of mild chronic microvascular ischemic change. Generalized volume loss. Prominent subarachnoid spaces bilaterally are likely related to the degree of volume loss. Vascular: No hyperdense vessel or unexpected calcification. Skull: Normal. Negative for fracture or focal lesion. Sinuses/Orbits: No middle ear or mastoid effusion. Paranasal sinuses are notable for  complete opacification of the left frontal sinus. Orbits are unremarkable. Other: None. CT CERVICAL SPINE FINDINGS Alignment: Trace retrolisthesis of C5 on C6. Grade 1 anterolisthesis of C7 on T1. Skull base and vertebrae: No acute fracture. No primary bone lesion or focal pathologic process. Soft tissues and spinal canal: No prevertebral fluid or swelling. No visible canal hematoma. Disc levels:  No evidence of high-grade spinal canal stenosis.  Upper chest: Negative. Other: None IMPRESSION: 1. No acute intracranial abnormality. 2. No acute fracture or traumatic subluxation of the cervical spine. Electronically Signed   By: Lorenza Cambridge M.D.   On: 06/20/2023 14:15     Time coordinating discharge: Over 30 minutes    Lewie Chamber, MD  [facility] 06/26/2023, 7:39 AM

## 2023-06-25 NOTE — Progress Notes (Signed)
[facility] Progress Note   Subjective:   seen in room - feeling well.  No new issues.  Looking forward to discharge.  I/Os 1.2 / 2L UOP.  Tolerating urea fine.   Objective Vitals:   06/24/23 1448 06/24/23 2041 06/25/23 0500 06/25/23 0556  BP: 123/73 128/71  (!) 150/74  Pulse: (!) 58 66  (!) 54  Resp: 20 18  20   Temp: 97.8 F (36.6 C) 97.8 F (36.6 C)  97.8 F (36.6 C)  TempSrc: Oral     SpO2: 98% 97%  98%  Weight:   87.7 kg   Height:       Physical Exam Gen: well appearing 89yo man  Eyes: anicteric ENT: MMM Neck: supple, no JVD CV:  RRR Abd: soft Back: clear ant GU: no foley, amber urine in male purewick can Extr:  no edema Neuro: AOx3, conversational  Additional Objective Labs: Basic Metabolic Panel: Recent Labs  Lab 06/23/23 1722 06/24/23 0802 06/25/23 0746  NA 123* 127* 128*  K 4.5 3.8 3.5  CL 94* 97* 96*  CO2 20* 20* 24  GLUCOSE 128* 89 93  BUN 19 36* 47*  CREATININE 1.24 1.03 1.05  CALCIUM 8.5* 8.9 9.2   Liver Function Tests: Recent Labs  Lab 06/20/23 1410 06/21/23 0806  AST 24 39  ALT 13 18  ALKPHOS 33* 36*  BILITOT 0.6 0.4  PROT 6.9 7.4  ALBUMIN 2.7* 2.7*   No results for input(s): "LIPASE", "AMYLASE" in the last 168 hours. CBC: Recent Labs  Lab 06/20/23 1410 06/21/23 0806 06/22/23 0519 06/23/23 0525 06/24/23 0802 06/25/23 0746  WBC 6.4 5.9 4.0 3.4* 3.3* 2.6*  NEUTROABS 4.7  --   --   --   --  0.8*  HGB 10.1* 12.1* 10.2* 10.6* 11.3* 11.5*  HCT 28.6* 34.0* 28.8* 30.4* 32.4* 32.1*  MCV 123.3* 119.3* 124.1* 122.1* 124.1* 117.6*  PLT 154 169 145* 141* 140* 165   Blood Culture    Component Value Date/Time   SDES  06/20/2023 2158    BLOOD LEFT HAND Performed at [facility], [address], [place], [place] [address]    SPECREQUEST  06/20/2023 2158    BOTTLES DRAWN AEROBIC ONLY Blood Culture adequate volume Performed at [facility], [address] [place], [place] [address]    CULT   06/20/2023 2158    NO GROWTH 4 DAYS Performed at [facility], [address] [place], [place] [address]    REPTSTATUS PENDING 06/20/2023 2158    Cardiac Enzymes: No results for input(s): "CKTOTAL", "CKMB", "CKMBINDEX", "TROPONINI" in the last 168 hours. CBG: No results for input(s): "GLUCAP" in the last 168 hours. Iron Studies:  No results for input(s): "IRON", "TIBC", "TRANSFERRIN", "FERRITIN" in the last 72 hours.  @[email or web address] @ Studies/Results: No results found. Medications:    acetaminophen  650 mg Oral Once   acidophilus  2 capsule Oral Daily   amLODipine  5 mg Oral Daily   atenolol  25 mg Oral Daily   vitamin B-12  500 mcg Oral Daily   feeding supplement  237 mL Oral BID BM   pantoprazole  40 mg Oral Daily   polyethylene glycol  17 g Oral Daily   urea  30 g Oral BID    Assessment/Plan **CAP: on CTX and azithromycin. Afebrile and on RA.    **Hyponatremia:  Initially thought hypovolemic and given IVF but sodium worse, labs with Uosm 624 and urine sodium 46 and SIADH was  suspected (in setting of PNA). TSH normal.  Placed on fluid restriction and then serum sodium worse with urine sodium 10 and urine osm 703 - concern with the low urine sodium he'd gotten a bit dry so gave LR yest AM but no improvement in Na (123) supporting SIADH.  Added urea 30g BID and serum sodium improved to 128 this AM.    Cont fluid restriction.  OK for discharge - would continue the urea 30g BID for a few weeks then stop and recheck labs within a week.  OK for PCP to do so.  If ongoing issues I'd be happy to see him but I think this is a transient phenomenon related to PNA and will self resolve.    **Hypokalemia: resolved with repletion   **Myeloproliferative DO complicated by anemia:  f/b onc out pt, on hydrea.    **A fib: rate controlled, was on eliquis but that's been stopped given falls recently.   **HTN: bp fine on home meds.    Nothing further to add, will sign off. Reach out  with concerns.   Estill Bakes MD 06/25/2023, 10:59 AM  [facility] Pager: [phone]

## 2023-06-25 NOTE — TOC Transition Note (Signed)
Transition of Care [facility]) - CM/SW Discharge Note   Patient Details  Name: Adam Tapia MRN: [id number] Date of Birth: 29-Aug-1923  Transition of [facility] CM/SW Contact:  Adrian Prows, RN Phone Number: 06/25/2023, 4:33 PM   Clinical Narrative:    D/C orders received; HHPT previously arranged w/ [facility]; notified Katina at agency notified; spoke w/ pt's dtr Vita and she declines walker; she says pt has one at home; no TOC needs.   Final next level of care: Home w Home Health Services Barriers to Discharge: No Barriers Identified   Patient Goals and CMS Choice CMS Medicare.gov Compare Post Acute Care list provided to:: Patient Choice offered to / list presented to : Spouse  Discharge Placement                         Discharge Plan and Services Additional resources added to the After Visit Summary for   In-house Referral: Clinical Social Work Discharge Planning Services: NA Post Acute Care Choice: Home Health          DME Arranged: N/A DME Agency: NA       HH Arranged: PCS/Personal Care Services [facility] Agency: [facility], [facility] Date HH Agency Contacted: 06/22/23 Time HH Agency Contacted: [id number] Representative spoke with at [facility] Agency: Tresa Endo  Social Determinants of Health (SDOH) Interventions SDOH Screenings   Food Insecurity: No Food Insecurity (06/20/2023)  Housing: Low Risk  (06/20/2023)  Transportation Needs: No Transportation Needs (06/20/2023)  Utilities: Not At Risk (06/20/2023)  Tobacco Use: Low Risk  (06/20/2023)     Readmission Risk Interventions    06/21/2023    3:16 PM  Readmission Risk Prevention Plan  Post Dischage Appt Complete  Medication Screening Complete  Transportation Screening Complete

## 2023-06-25 NOTE — Plan of Care (Signed)
  Problem: Activity: Goal: Ability to tolerate increased activity will improve Outcome: Progressing   Problem: Clinical Measurements: Goal: Ability to maintain a body temperature in the normal range will improve Outcome: Progressing   Problem: Respiratory: Goal: Ability to maintain adequate ventilation will improve Outcome: Progressing Goal: Ability to maintain a clear airway will improve Outcome: Progressing   Problem: Education: Goal: Knowledge of General Education information will improve Description: Including pain rating scale, medication(s)/side effects and non-pharmacologic comfort measures Outcome: Progressing   Problem: Clinical Measurements: Goal: Ability to maintain clinical measurements within normal limits will improve Outcome: Progressing   Problem: Activity: Goal: Risk for activity intolerance will decrease Outcome: Progressing   Problem: Nutrition: Goal: Adequate nutrition will be maintained Outcome: Progressing   Problem: Safety: Goal: Ability to remain free from injury will improve Outcome: Progressing   Problem: Skin Integrity: Goal: Risk for impaired skin integrity will decrease Outcome: Progressing

## 2023-06-26 ENCOUNTER — Telehealth: Payer: Self-pay | Admitting: Internal Medicine

## 2023-06-26 ENCOUNTER — Ambulatory Visit: Payer: Medicare Other | Admitting: Internal Medicine

## 2023-06-26 ENCOUNTER — Other Ambulatory Visit: Payer: Medicare Other

## 2023-06-26 DIAGNOSIS — J189 Pneumonia, unspecified organism: Secondary | ICD-10-CM | POA: Diagnosis not present

## 2023-06-26 DIAGNOSIS — E871 Hypo-osmolality and hyponatremia: Secondary | ICD-10-CM | POA: Diagnosis not present

## 2023-06-26 NOTE — Progress Notes (Signed)
Progress Note    Adam Tapia   ZOX:[id number]  DOB: [birth year]  DOA: 06/20/2023     6 PCP: Fleet Contras, MD  Initial CC: weakness, falls  Hospital Course: Adam Tapia is a 89 yo male with PMH MPD, afib, HTN, GERD, CKD3/4, psoriasis, unsteady gait who was brought to the hospital after recurrent falls and weakness.  Imaging studies were negative for fractures or injury. CXR noted mild opacity in the right middle and lower lung.  He was started on antibiotics for pneumonia treatment. Further workup also revealed hyponatremia, 127.  He was admitted for further workup for etiology and nephrology evaluation.  Interval History:  Was to d/c today but family unable to transport home. Been doing okay otherwise.   Assessment and Plan: * Hyponatremia - Na 127 on admission; downtrend to 123  - urine osmo 624 and repeated, 703 - urine Na 46 and repeated, 10 - with osmo that high, SIADH does fit the best it seems; esp with worsening Na after given IVF initially - etiology possibly PNA but also concerned for possible lung mass given CXR appearance; obtaining CT chest (too much infiltrate to delineate mass)  -Followed by nephrology during hospitalization.  Continued on Ure-na at discharge for 3 weeks -Needs repeat BMP with PCP at follow-up  CAP (community acquired pneumonia) - completed 5 day course of rocephin and azithro inpatient  - possible that this contributed to his weakness/falls; CXR noted opacity in RML/RLL - appearance of CXR doesn't match his mild symptoms, esp in setting of history of MPD - will obtain CT chest to rule out lung mass which could also be contributing to an SIADH picture too - per CT chest: "Peripheral consolidation of the right lower and right upper lobes with surrounding ground-glass opacities likely reflect pneumonia, however an underlying mass lesion is difficult to exclude. Recommend follow-up CT in 3 months to ensure resolution."  Myeloproliferative disorder  (HCC) - followed outpt by Dr. Arbutus Ped - on hydroxyurea at home   Chronic kidney disease, stage 3b [facility]) - patient has history of CKD3b. Baseline creat ~ 1.4, eGFR~ 35 - renal function currently better than baseline    Essential hypertension - Continue amlodipine, atenolol   Old records reviewed in assessment of this patient  Antimicrobials:   DVT prophylaxis:  SCDs Start: 06/20/23 1759   Code Status:   Code Status: Full Code  Mobility Assessment (Last 72 Hours)     Mobility Assessment     Row Name 06/25/23 2101 06/25/23 0817 06/25/23 0700 06/24/23 2102 06/24/23 2045   Does patient have an order for bedrest or is patient medically unstable No - Continue assessment No - Continue assessment -- No - Continue assessment --   What is the highest level of mobility based on the progressive mobility assessment? Level 3 (Stands with assist) - Balance while standing  and cannot march in place Level 3 (Stands with assist) - Balance while standing  and cannot march in place -- Level 3 (Stands with assist) - Balance while standing  and cannot march in place Level 3 (Stands with assist) - Balance while standing  and cannot march in place   Is the above level different from baseline mobility prior to current illness? -- Yes - Recommend PT order -- -- --    Row Name 06/24/23 1102 06/23/23 2100 06/23/23 1313 06/23/23 0800     Does patient have an order for bedrest or is patient medically unstable No - Continue assessment No - Continue  assessment -- No - Continue assessment    What is the highest level of mobility based on the progressive mobility assessment? Level 3 (Stands with assist) - Balance while standing  and cannot march in place Level 2 (Chairfast) - Balance while sitting on edge of bed and cannot stand Level 3 (Stands with assist) - Balance while standing  and cannot march in place Level 2 (Chairfast) - Balance while sitting on edge of bed and cannot stand    Is the above level different  from baseline mobility prior to current illness? Yes - Recommend PT order Yes - Recommend PT order -- Yes - Recommend PT order            Mobility Assessment (Last 72 Hours)     Mobility Assessment     Row Name 06/25/23 2101 06/25/23 0817 06/25/23 0700 06/24/23 2102 06/24/23 2045   Does patient have an order for bedrest or is patient medically unstable No - Continue assessment No - Continue assessment -- No - Continue assessment --   What is the highest level of mobility based on the progressive mobility assessment? Level 3 (Stands with assist) - Balance while standing  and cannot march in place Level 3 (Stands with assist) - Balance while standing  and cannot march in place -- Level 3 (Stands with assist) - Balance while standing  and cannot march in place Level 3 (Stands with assist) - Balance while standing  and cannot march in place   Is the above level different from baseline mobility prior to current illness? -- Yes - Recommend PT order -- -- --    Row Name 06/24/23 1102 06/23/23 2100 06/23/23 1313 06/23/23 0800     Does patient have an order for bedrest or is patient medically unstable No - Continue assessment No - Continue assessment -- No - Continue assessment    What is the highest level of mobility based on the progressive mobility assessment? Level 3 (Stands with assist) - Balance while standing  and cannot march in place Level 2 (Chairfast) - Balance while sitting on edge of bed and cannot stand Level 3 (Stands with assist) - Balance while standing  and cannot march in place Level 2 (Chairfast) - Balance while sitting on edge of bed and cannot stand    Is the above level different from baseline mobility prior to current illness? Yes - Recommend PT order Yes - Recommend PT order -- Yes - Recommend PT order            Mobility Assessment (Last 72 Hours)     Mobility Assessment     Row Name 06/25/23 2101 06/25/23 0817 06/25/23 0700 06/24/23 2102 06/24/23 2045   Does patient  have an order for bedrest or is patient medically unstable No - Continue assessment No - Continue assessment -- No - Continue assessment --   What is the highest level of mobility based on the progressive mobility assessment? Level 3 (Stands with assist) - Balance while standing  and cannot march in place Level 3 (Stands with assist) - Balance while standing  and cannot march in place -- Level 3 (Stands with assist) - Balance while standing  and cannot march in place Level 3 (Stands with assist) - Balance while standing  and cannot march in place   Is the above level different from baseline mobility prior to current illness? -- Yes - Recommend PT order -- -- --    Row Name 06/24/23 1102 06/23/23 2100 06/23/23  1313 06/23/23 0800     Does patient have an order for bedrest or is patient medically unstable No - Continue assessment No - Continue assessment -- No - Continue assessment    What is the highest level of mobility based on the progressive mobility assessment? Level 3 (Stands with assist) - Balance while standing  and cannot march in place Level 2 (Chairfast) - Balance while sitting on edge of bed and cannot stand Level 3 (Stands with assist) - Balance while standing  and cannot march in place Level 2 (Chairfast) - Balance while sitting on edge of bed and cannot stand    Is the above level different from baseline mobility prior to current illness? Yes - Recommend PT order Yes - Recommend PT order -- Yes - Recommend PT order             Barriers to discharge: none Disposition Plan:  Home Status is: Inpt  Objective: Blood pressure 129/69, pulse 65, temperature 98.1 F (36.7 C), temperature source Oral, resp. rate 20, height 5\' 8"  (1.727 m), weight 83.9 kg, SpO2 97%.  Examination:  Physical Exam Constitutional:      General: He is not in acute distress.    Appearance: Normal appearance.  HENT:     Head: Normocephalic and atraumatic.     Mouth/Throat:     Mouth: Mucous membranes are  moist.  Eyes:     Extraocular Movements: Extraocular movements intact.  Cardiovascular:     Rate and Rhythm: Normal rate and regular rhythm.  Pulmonary:     Effort: Pulmonary effort is normal. No respiratory distress.     Breath sounds: Normal breath sounds. No wheezing.  Abdominal:     General: Bowel sounds are normal. There is no distension.     Palpations: Abdomen is soft.     Tenderness: There is no abdominal tenderness.  Musculoskeletal:        General: Normal range of motion.     Cervical back: Normal range of motion and neck supple.  Skin:    General: Skin is warm and dry.  Neurological:     General: No focal deficit present.     Mental Status: He is alert.  Psychiatric:        Mood and Affect: Mood normal.        Behavior: Behavior normal.      Consultants:  Nephrology   Procedures:    Data Reviewed: Results for orders placed or performed during the hospital encounter of 06/20/23 (from the past 24 hour(s))  Basic metabolic panel     Status: Abnormal   Collection Time: 06/25/23  7:46 AM  Result Value Ref Range   Sodium 128 (L) 135 - 145 mmol/L   Potassium 3.5 3.5 - 5.1 mmol/L   Chloride 96 (L) 98 - 111 mmol/L   CO2 24 22 - 32 mmol/L   Glucose, Bld 93 70 - 99 mg/dL   BUN 47 (H) 8 - 23 mg/dL   Creatinine, Ser 2.44 0.61 - 1.24 mg/dL   Calcium 9.2 8.9 - 01.0 mg/dL   GFR, Estimated >27 >25 mL/min   Anion gap 8 5 - 15  CBC with Differential/Platelet     Status: Abnormal   Collection Time: 06/25/23  7:46 AM  Result Value Ref Range   WBC 2.6 (L) 4.0 - 10.5 K/uL   RBC 2.73 (L) 4.22 - 5.81 MIL/uL   Hemoglobin 11.5 (L) 13.0 - 17.0 g/dL   HCT 36.6 (L) 44.0 - 34.7 %  MCV 117.6 (H) 80.0 - 100.0 fL   MCH 42.1 (H) 26.0 - 34.0 pg   MCHC 35.8 30.0 - 36.0 g/dL   RDW 16.1 09.6 - 04.5 %   Platelets 165 150 - 400 K/uL   nRBC 0.0 0.0 - 0.2 %   Neutrophils Relative % 30 %   Neutro Abs 0.8 (L) 1.7 - 7.7 K/uL   Lymphocytes Relative 48 %   Lymphs Abs 1.3 0.7 - 4.0 K/uL    Monocytes Relative 19 %   Monocytes Absolute 0.5 0.1 - 1.0 K/uL   Eosinophils Relative 1 %   Eosinophils Absolute 0.0 0.0 - 0.5 K/uL   Basophils Relative 0 %   Basophils Absolute 0.0 0.0 - 0.1 K/uL   Immature Granulocytes 2 %   Abs Immature Granulocytes 0.05 0.00 - 0.07 K/uL  Basic metabolic panel     Status: Abnormal   Collection Time: 06/26/23  5:08 AM  Result Value Ref Range   Sodium 129 (L) 135 - 145 mmol/L   Potassium 3.3 (L) 3.5 - 5.1 mmol/L   Chloride 97 (L) 98 - 111 mmol/L   CO2 25 22 - 32 mmol/L   Glucose, Bld 93 70 - 99 mg/dL   BUN 66 (H) 8 - 23 mg/dL   Creatinine, Ser 4.09 0.61 - 1.24 mg/dL   Calcium 9.1 8.9 - 81.1 mg/dL   GFR, Estimated >91 >47 mL/min   Anion gap 7 5 - 15    I have reviewed pertinent nursing notes, vitals, labs, and images as necessary. I have ordered labwork to follow up on as indicated.  I have reviewed the last notes from staff over past 24 hours. I have discussed patient's care plan and test results with nursing staff, CM/SW, and other staff as appropriate.  Time spent: Greater than 50% of the 55 minute visit was spent in counseling/coordination of care for the patient as laid out in the A&P.   LOS: 6 days   Lewie Chamber, MD [facility] 06/26/2023, 7:37 AM

## 2023-06-26 NOTE — Telephone Encounter (Signed)
Patient's spouse is aware of rescheduled appointment times/dates

## 2023-06-26 NOTE — TOC Transition Note (Signed)
Transition of Care [facility]) - CM/SW Discharge Note   Patient Details  Name: KALON WHISENHUNT MRN: [id number] Date of Birth: 1923-10-10  [facility]) CM/SW Contact:  Otelia Santee, LCSW Phone Number: 06/26/2023, 10:57 AM   Clinical Narrative:       Final next level of care: Home w Home Health Services Barriers to Discharge: No Barriers Identified   Patient Goals and CMS Choice CMS Medicare.gov Compare Post Acute Care list provided to:: Patient Choice offered to / list presented to : Spouse  Discharge Placement  Pt spouse unable to provide transportation for pt home. PTAR called at 10:47am for transportation home.                        Discharge Plan and Services Additional resources added to the After Visit Summary for   In-house Referral: Clinical Social Work Discharge Planning Services: NA Post Acute Care Choice: Home Health          DME Arranged: N/A DME Agency: NA       HH Arranged: PCS/Personal Care Services [facility] Agency: [facility] Date HH Agency Contacted: 06/22/23 Time HH Agency Contacted: [id number] Representative spoke with at [facility] Agency: Tresa Endo  Social Determinants of Health (SDOH) Interventions SDOH Screenings   Food Insecurity: No Food Insecurity (06/20/2023)  Housing: Low Risk  (06/20/2023)  Transportation Needs: No Transportation Needs (06/20/2023)  Utilities: Not At Risk (06/20/2023)  Tobacco Use: Low Risk  (06/20/2023)     Readmission Risk Interventions    06/21/2023    3:16 PM  Readmission Risk Prevention Plan  Post Dischage Appt Complete  Medication Screening Complete  Transportation Screening Complete

## 2023-06-30 IMAGING — DX DG CHEST 2V
2 series · 2 of 2 positions shown · non-contrast
Comparison: 05/17/2011

CLINICAL DATA: Cough and wheezing.

EXAM:
CHEST - 2 VIEW

[chest pa]
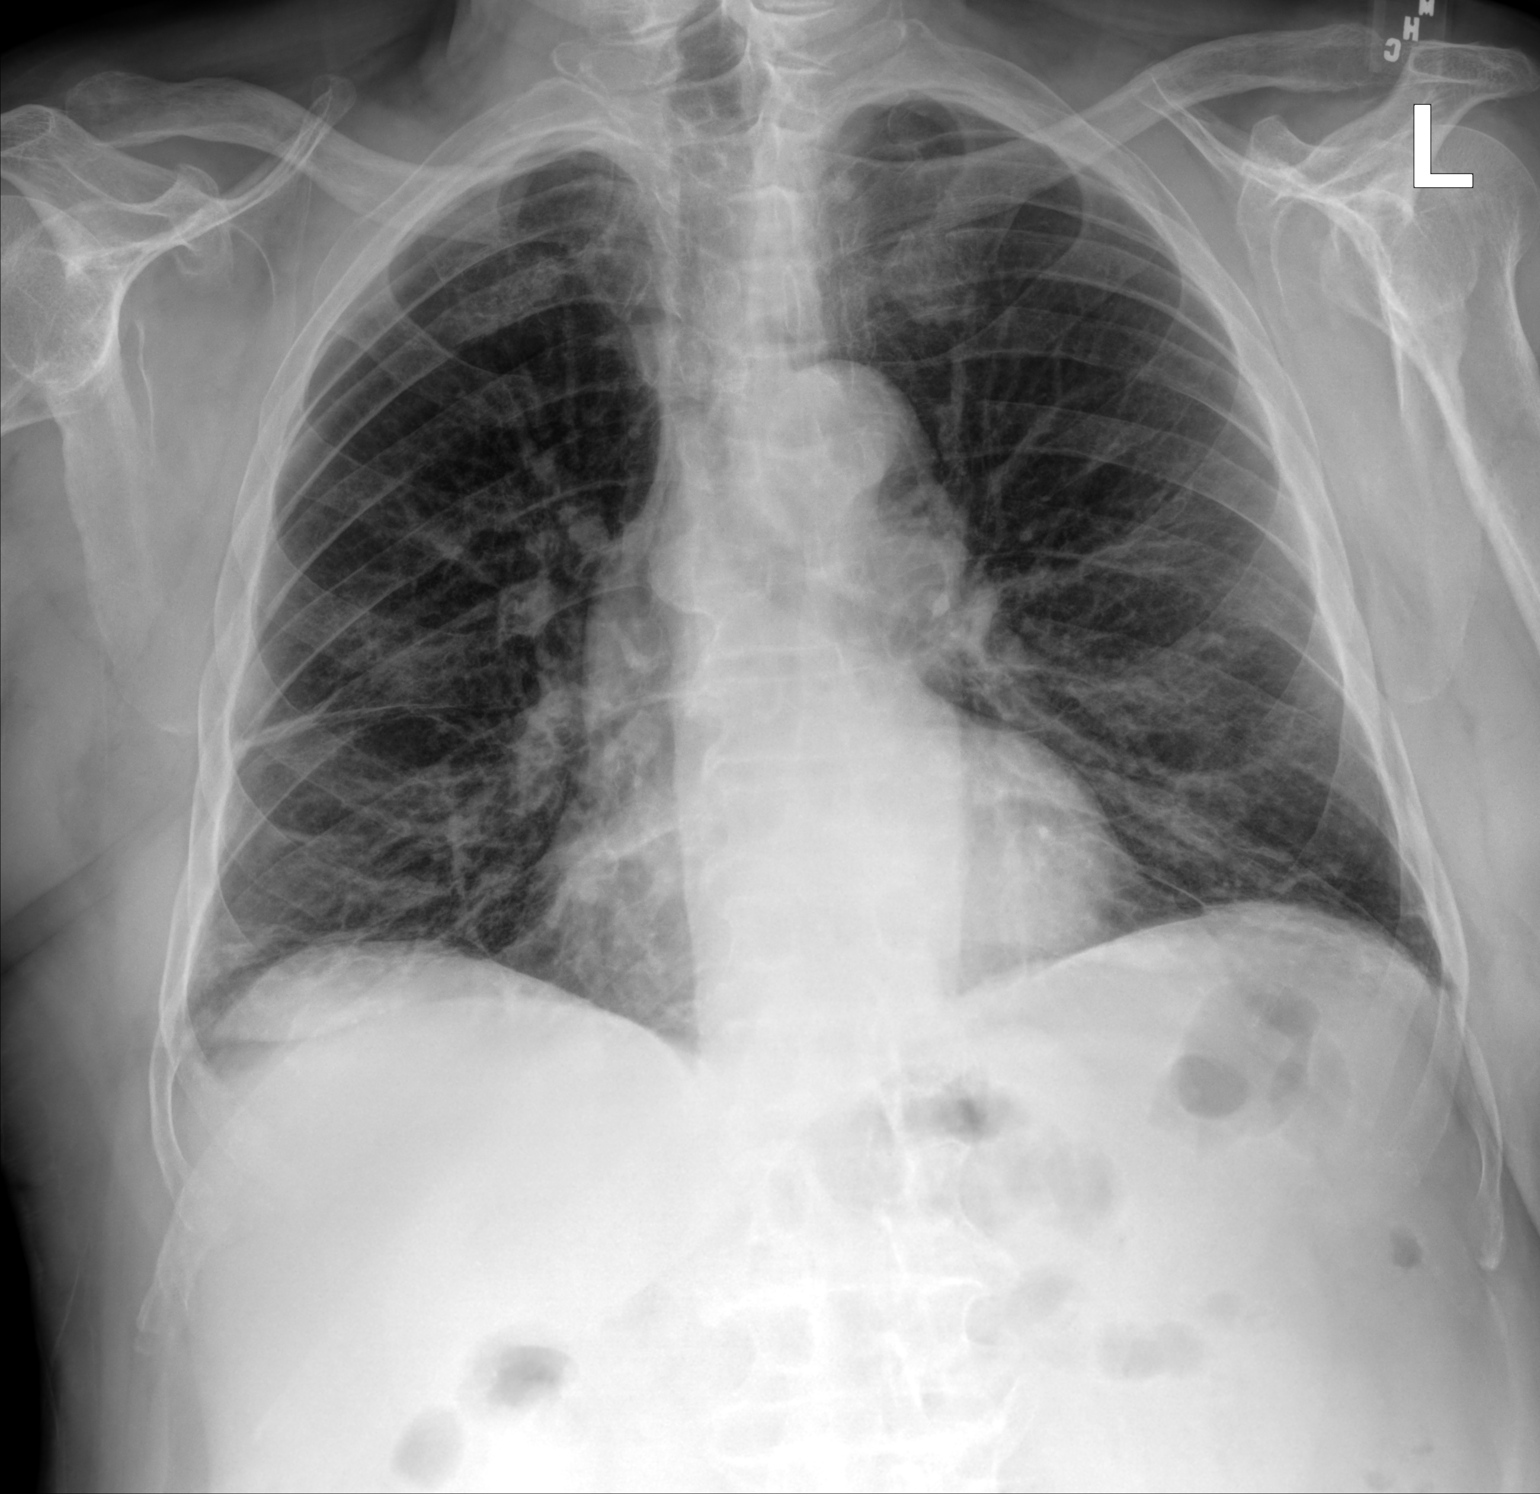

[chest lat]
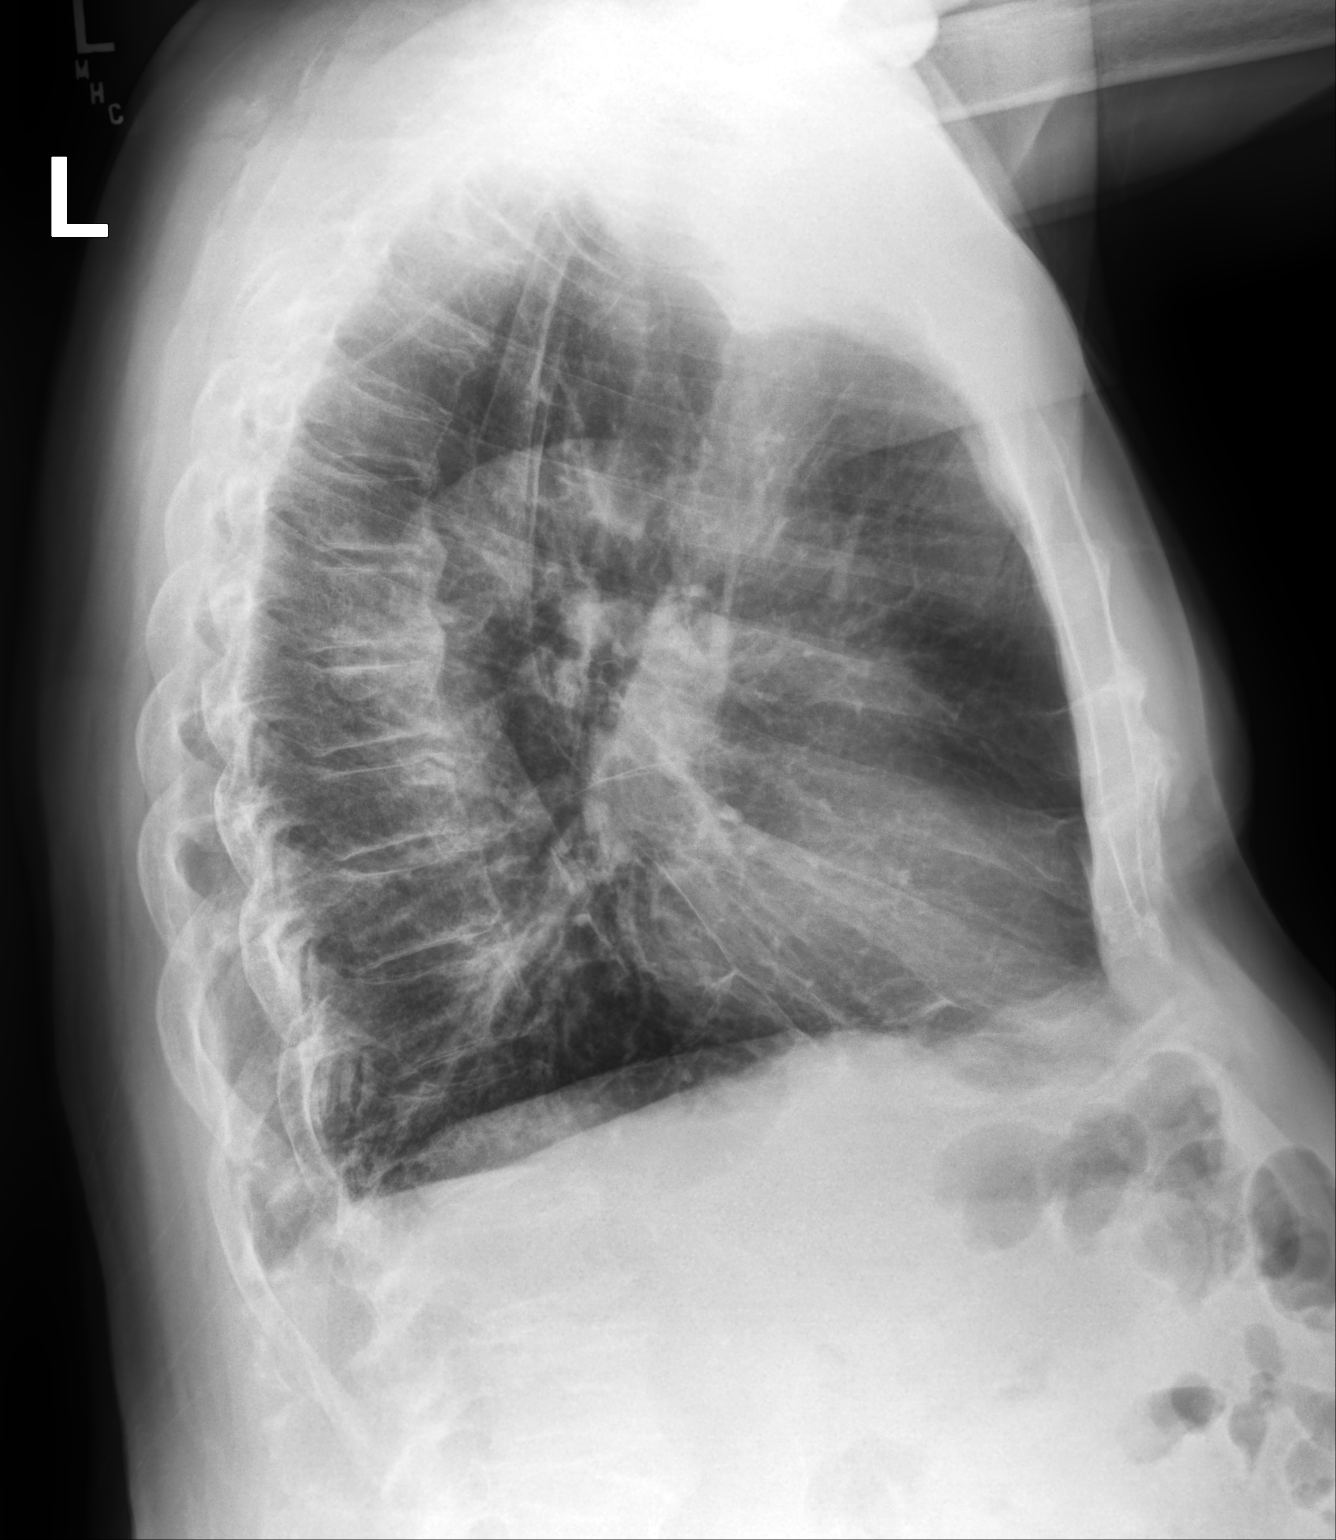

[2 of 2 positions shown; findings below may reference images not displayed]

FINDINGS: The cardiomediastinal silhouette is unremarkable.

Mild peribronchial thickening and minimal bibasilar atelectasis
noted.

There is no evidence of focal airspace disease, pulmonary edema,
suspicious pulmonary nodule/mass, pleural effusion, or pneumothorax.

No acute bony abnormalities are identified.
IMPRESSION: Mild peribronchial thickening and minimal bibasilar atelectasis. No
evidence of focal pneumonia.

## 2023-07-06 ENCOUNTER — Other Ambulatory Visit: Payer: Self-pay | Admitting: Internal Medicine

## 2023-07-07 LAB — BASIC METABOLIC PANEL WITH GFR
BUN/Creatinine Ratio: 78 (calc) — ABNORMAL HIGH (ref 6–22)
BUN: 123 mg/dL — ABNORMAL HIGH (ref 7–25)
CO2: 20 mmol/L (ref 20–32)
Calcium: 10 mg/dL (ref 8.6–10.3)
Chloride: 102 mmol/L (ref 98–110)
Creat: 1.58 mg/dL — ABNORMAL HIGH (ref 0.70–1.22)
Glucose, Bld: 103 mg/dL — ABNORMAL HIGH (ref 65–99)
Potassium: 3.8 mmol/L (ref 3.5–5.3)
Sodium: 131 mmol/L — ABNORMAL LOW (ref 135–146)
eGFR: 39 mL/min/{1.73_m2} — ABNORMAL LOW (ref >=60)

## 2023-07-07 LAB — CBC
HCT: 29.4 % — ABNORMAL LOW (ref 38.5–50.0)
Hemoglobin: 10.6 g/dL — ABNORMAL LOW (ref 13.2–17.1)
MCH: 43.1 pg — ABNORMAL HIGH (ref 27.0–33.0)
MCHC: 36.1 g/dL — ABNORMAL HIGH (ref 32.0–36.0)
MCV: 119.5 fL — ABNORMAL HIGH (ref 80.0–100.0)
MPV: 10.3 fL (ref 7.5–12.5)
Platelets: 199 10*3/uL (ref 140–400)
RBC: 2.46 10*6/uL — ABNORMAL LOW (ref 4.20–5.80)
RDW: 15.5 % — ABNORMAL HIGH (ref 11.0–15.0)
WBC: 5.1 10*3/uL (ref 3.8–10.8)

## 2023-07-25 ENCOUNTER — Other Ambulatory Visit: Payer: Self-pay | Admitting: Medical Oncology

## 2023-07-25 DIAGNOSIS — D471 Chronic myeloproliferative disease: Secondary | ICD-10-CM

## 2023-07-26 ENCOUNTER — Inpatient Hospital Stay: Payer: Medicare Other | Admitting: Internal Medicine

## 2023-07-26 ENCOUNTER — Inpatient Hospital Stay: Payer: Medicare Other | Attending: Internal Medicine

## 2023-07-26 VITALS — BP 116/67 | HR 67 | Temp 98.2°F | Resp 17 | Ht 68.0 in | Wt 174.5 lb

## 2023-07-26 DIAGNOSIS — D471 Chronic myeloproliferative disease: Secondary | ICD-10-CM | POA: Insufficient documentation

## 2023-07-26 DIAGNOSIS — Z79899 Other long term (current) drug therapy: Secondary | ICD-10-CM | POA: Diagnosis not present

## 2023-07-26 LAB — CBC WITH DIFFERENTIAL (CANCER CENTER ONLY)
Abs Immature Granulocytes: 0.13 10*3/uL — ABNORMAL HIGH (ref 0.00–0.07)
Basophils Absolute: 0 10*3/uL (ref 0.0–0.1)
Basophils Relative: 1 %
Eosinophils Absolute: 0 10*3/uL (ref 0.0–0.5)
Eosinophils Relative: 1 %
HCT: 28.7 % — ABNORMAL LOW (ref 39.0–52.0)
Hemoglobin: 10.5 g/dL — ABNORMAL LOW (ref 13.0–17.0)
Immature Granulocytes: 3 %
Lymphocytes Relative: 48 %
Lymphs Abs: 2.1 10*3/uL (ref 0.7–4.0)
MCH: 45.3 pg — ABNORMAL HIGH (ref 26.0–34.0)
MCHC: 36.6 g/dL — ABNORMAL HIGH (ref 30.0–36.0)
MCV: 123.7 fL — ABNORMAL HIGH (ref 80.0–100.0)
Monocytes Absolute: 0.4 10*3/uL (ref 0.1–1.0)
Monocytes Relative: 10 %
Neutro Abs: 1.6 10*3/uL — ABNORMAL LOW (ref 1.7–7.7)
Neutrophils Relative %: 37 %
Platelet Count: 214 10*3/uL (ref 150–400)
RBC: 2.32 MIL/uL — ABNORMAL LOW (ref 4.22–5.81)
RDW: 13.7 % (ref 11.5–15.5)
WBC Count: 4.3 10*3/uL (ref 4.0–10.5)
nRBC: 0 % (ref 0.0–0.2)

## 2023-07-26 LAB — CMP (CANCER CENTER ONLY)
ALT: 8 U/L (ref 0–44)
AST: 17 U/L (ref 15–41)
Albumin: 3.5 g/dL (ref 3.5–5.0)
Alkaline Phosphatase: 43 U/L (ref 38–126)
Anion gap: 6 (ref 5–15)
BUN: 24 mg/dL — ABNORMAL HIGH (ref 8–23)
CO2: 22 mmol/L (ref 22–32)
Calcium: 9.1 mg/dL (ref 8.9–10.3)
Chloride: 106 mmol/L (ref 98–111)
Creatinine: 1.28 mg/dL — ABNORMAL HIGH (ref 0.61–1.24)
GFR, Estimated: 51 mL/min — ABNORMAL LOW (ref >60)
Glucose, Bld: 91 mg/dL (ref 70–99)
Potassium: 3.4 mmol/L — ABNORMAL LOW (ref 3.5–5.1)
Sodium: 134 mmol/L — ABNORMAL LOW (ref 135–145)
Total Bilirubin: 0.5 mg/dL (ref 0.3–1.2)
Total Protein: 7.2 g/dL (ref 6.5–8.1)

## 2023-07-26 LAB — LACTATE DEHYDROGENASE: LDH: 159 U/L (ref 98–192)

## 2023-07-26 NOTE — Progress Notes (Signed)
[facility] [phone]   Fax:([phone]) [phone]  OFFICE PROGRESS NOTE  Fleet Contras, MD [address] [place] [address]  DIAGNOSIS: Myeloproliferative disorder with positive JAK-2 mutation.  PRIOR THERAPY: None.  CURRENT THERAPY: Hydrea 500 mg by mouth daily. Status post approximately 38 months months of treatment.  The dose of Hydrea was increased to 500 mg twice a day on 04/03/2018.   INTERVAL HISTORY: Adam Tapia 89 y.o. male returns to the clinic today for follow-up visit accompanied by his wife.  The patient is feeling fine today with no concerning complaints except for the mild baseline fatigue.  He denied having any current chest pain, shortness of breath, cough or hemoptysis.  He has no nausea, vomiting, diarrhea or constipation.  He has no headache or visual changes.  He denied having any recent fever or chills.  He has been tolerating his treatment with hydroxyurea fairly well.  He is here for evaluation and repeat blood work.  ALLERGIES:  is allergic to cinnamon and nsaids.  MEDICATIONS:  Current Outpatient Medications  Medication Sig Dispense Refill   acetaminophen (TYLENOL) 325 MG tablet Take 650 mg by mouth daily as needed for moderate pain.     allopurinol (ZYLOPRIM) 100 MG tablet TAKE 1 TABLET BY MOUTH EVERY DAY (Patient taking differently: Take 100 mg by mouth daily as needed (gout).) 90 tablet 1   amLODipine (NORVASC) 5 MG tablet Take 5 mg by mouth daily.     atenolol (TENORMIN) 25 MG tablet Take 25 mg by mouth daily.  5   candesartan (ATACAND) 8 MG tablet Take 8 mg by mouth daily.     cyanocobalamin (VITAMIN B12) 500 MCG tablet Take 1 tablet (500 mcg total) by mouth daily.     diclofenac Sodium (VOLTAREN) 1 % GEL Apply 2 g topically daily as needed (pain).     fexofenadine (ALLEGRA) 180 MG tablet Take 180 mg by mouth daily.     fluticasone (FLONASE) 50 MCG/ACT nasal spray Place 1 spray into both nostrils daily as needed for  allergies or rhinitis.     hydroxyurea (HYDREA) 500 MG capsule TAKE 1 CAPSULE BY MOUTH TWICE A DAY (MAY TAKE WITH FOOD TO MINIMIZE SIDE EFFECTS) (Patient taking differently: Take 500 mg by mouth 2 (two) times daily.) 60 capsule 2   ketotifen (ZADITOR) 0.025 % ophthalmic solution Place 1 drop into both eyes daily as needed (itching eyes).     NON FORMULARY Apply 1 application  topically daily as needed (pain). Veterinary liniment gel     Probiotic Product (RESTORA) CAPS Take 1 capsule by mouth daily.     No current facility-administered medications for this visit.    REVIEW OF SYSTEMS:  A comprehensive review of systems was negative except for: Constitutional: positive for fatigue   PHYSICAL EXAMINATION: General appearance: alert, cooperative, and no distress Head: Normocephalic, without obvious abnormality, atraumatic Neck: no adenopathy Lymph nodes: Cervical, supraclavicular, and axillary nodes normal. Resp: clear to auscultation bilaterally Back: symmetric, no curvature. ROM normal. No CVA tenderness. Cardio: regular rate and rhythm, S1, S2 normal, no murmur, click, rub or gallop GI: soft, non-tender; bowel sounds normal; no masses,  no organomegaly Extremities: extremities normal, atraumatic, no cyanosis or edema  ECOG PERFORMANCE STATUS: 1 - Symptomatic but completely ambulatory  Blood pressure 116/67, pulse 67, temperature 98.2 F (36.8 C), temperature source Oral, resp. rate 17, height 5\' 8"  (1.727 m), weight 174 lb 8 oz (79.2 kg), SpO2 100%.  LABORATORY  DATA: Lab Results  Component Value Date   WBC 4.3 07/26/2023   HGB 10.5 (L) 07/26/2023   HCT 28.7 (L) 07/26/2023   MCV 123.7 (H) 07/26/2023   PLT 214 07/26/2023      Chemistry      Component Value Date/Time   NA 134 (L) 07/26/2023 0923   NA 138 10/05/2017 1018   K 3.4 (L) 07/26/2023 0923   K 4.4 10/05/2017 1018   CL 106 07/26/2023 0923   CO2 22 07/26/2023 0923   CO2 25 10/05/2017 1018   BUN 24 (H) 07/26/2023 0923    BUN 21.8 10/05/2017 1018   CREATININE 1.28 (H) 07/26/2023 0923   CREATININE 1.58 (H) 07/06/2023 1442   CREATININE 1.3 10/05/2017 1018   GLU 88 03/17/2016 0000      Component Value Date/Time   CALCIUM 9.1 07/26/2023 0923   CALCIUM 9.9 10/05/2017 1018   ALKPHOS 43 07/26/2023 0923   ALKPHOS 154 (H) 10/05/2017 1018   AST 17 07/26/2023 0923   AST 18 10/05/2017 1018   ALT 8 07/26/2023 0923   ALT 13 10/05/2017 1018   BILITOT 0.5 07/26/2023 0923   BILITOT 0.45 10/05/2017 1018       RADIOGRAPHIC STUDIES: No results found.  ASSESSMENT AND PLAN:  This is a very pleasant 89 years old African-American male with myeloproliferative disorder and persistent leukocytosis and thrombocytosis with positive JAK 2 mutation. The patient is currently on treatment with hydroxyurea 500 mg p.o. daily status post 38 months. His treatment was changed to 500 mg p.o. twice daily in May 2019. The patient has been tolerating this treatment well with no concerning adverse effects. Repeat blood work today is unremarkable except for the mild anemia. I recommended for him to continue his current treatment with hydroxyurea 500 mg p.o. twice daily. I will see him back for follow-up visit in 3 months for evaluation and repeat blood work. He was advised to call immediately if he has any other concerning symptoms in the interval.  The patient voices understanding of current disease status and treatment options and is in agreement with the current care plan. All questions were answered. The patient knows to call the clinic with any problems, questions or concerns. We can certainly see the patient much sooner if necessary.  Disclaimer: This note was dictated with voice recognition software. Similar sounding words can inadvertently be transcribed and may not be corrected upon review.

## 2023-10-01 ENCOUNTER — Other Ambulatory Visit: Payer: Self-pay | Admitting: Internal Medicine

## 2023-10-01 DIAGNOSIS — D471 Chronic myeloproliferative disease: Secondary | ICD-10-CM

## 2023-10-27 ENCOUNTER — Telehealth: Payer: Self-pay | Admitting: Internal Medicine

## 2023-10-27 NOTE — Telephone Encounter (Signed)
Patient's spouse is aware of rescheduled appointment times/dates due to Dr. Kerry Fort being out of office

## 2023-11-02 ENCOUNTER — Ambulatory Visit: Payer: Medicare Other | Admitting: Internal Medicine

## 2023-11-02 ENCOUNTER — Other Ambulatory Visit: Payer: Medicare Other

## 2023-11-29 ENCOUNTER — Inpatient Hospital Stay: Payer: Medicare Other | Admitting: Internal Medicine

## 2023-11-29 ENCOUNTER — Inpatient Hospital Stay: Payer: Medicare Other | Attending: Internal Medicine

## 2023-11-29 VITALS — BP 163/77 | HR 65 | Temp 97.1°F | Resp 16 | Ht 68.0 in | Wt 194.3 lb

## 2023-11-29 DIAGNOSIS — D471 Chronic myeloproliferative disease: Secondary | ICD-10-CM

## 2023-11-29 DIAGNOSIS — C9210 Chronic myeloid leukemia, BCR/ABL-positive, not having achieved remission: Secondary | ICD-10-CM | POA: Insufficient documentation

## 2023-11-29 DIAGNOSIS — R600 Localized edema: Secondary | ICD-10-CM | POA: Insufficient documentation

## 2023-11-29 DIAGNOSIS — R413 Other amnesia: Secondary | ICD-10-CM | POA: Insufficient documentation

## 2023-11-29 DIAGNOSIS — Z79899 Other long term (current) drug therapy: Secondary | ICD-10-CM | POA: Insufficient documentation

## 2023-11-29 LAB — CMP (CANCER CENTER ONLY)
ALT: 8 U/L (ref 0–44)
AST: 16 U/L (ref 15–41)
Albumin: 3.9 g/dL (ref 3.5–5.0)
Alkaline Phosphatase: 84 U/L (ref 38–126)
Anion gap: 4 — ABNORMAL LOW (ref 5–15)
BUN: 20 mg/dL (ref 8–23)
CO2: 26 mmol/L (ref 22–32)
Calcium: 9.7 mg/dL (ref 8.9–10.3)
Chloride: 107 mmol/L (ref 98–111)
Creatinine: 1.21 mg/dL (ref 0.61–1.24)
GFR, Estimated: 54 mL/min — ABNORMAL LOW (ref >60)
Glucose, Bld: 93 mg/dL (ref 70–99)
Potassium: 4.3 mmol/L (ref 3.5–5.1)
Sodium: 137 mmol/L (ref 135–145)
Total Bilirubin: 0.5 mg/dL (ref 0.0–1.2)
Total Protein: 7.8 g/dL (ref 6.5–8.1)

## 2023-11-29 LAB — CBC WITH DIFFERENTIAL (CANCER CENTER ONLY)
Abs Immature Granulocytes: 0.34 10*3/uL — ABNORMAL HIGH (ref 0.00–0.07)
Basophils Absolute: 0.1 10*3/uL (ref 0.0–0.1)
Basophils Relative: 1 %
Eosinophils Absolute: 0.1 10*3/uL (ref 0.0–0.5)
Eosinophils Relative: 0 %
HCT: 35.9 % — ABNORMAL LOW (ref 39.0–52.0)
Hemoglobin: 12.8 g/dL — ABNORMAL LOW (ref 13.0–17.0)
Immature Granulocytes: 3 %
Lymphocytes Relative: 23 %
Lymphs Abs: 2.7 10*3/uL (ref 0.7–4.0)
MCH: 43.2 pg — ABNORMAL HIGH (ref 26.0–34.0)
MCHC: 35.7 g/dL (ref 30.0–36.0)
MCV: 121.3 fL — ABNORMAL HIGH (ref 80.0–100.0)
Monocytes Absolute: 0.8 10*3/uL (ref 0.1–1.0)
Monocytes Relative: 7 %
Neutro Abs: 8.1 10*3/uL — ABNORMAL HIGH (ref 1.7–7.7)
Neutrophils Relative %: 66 %
Platelet Count: 293 10*3/uL (ref 150–400)
RBC: 2.96 MIL/uL — ABNORMAL LOW (ref 4.22–5.81)
RDW: 11.8 % (ref 11.5–15.5)
WBC Count: 12.2 10*3/uL — ABNORMAL HIGH (ref 4.0–10.5)
nRBC: 0 % (ref 0.0–0.2)

## 2023-11-29 LAB — LACTATE DEHYDROGENASE: LDH: 179 U/L (ref 98–192)

## 2023-11-29 NOTE — Progress Notes (Signed)
 [facility] [phone]   [phone]  OFFICE PROGRESS NOTE  Shelda Atlas, MD [address] [place] [address]  DIAGNOSIS: Myeloproliferative disorder with positive JAK-2 mutation.  PRIOR THERAPY: None.  CURRENT THERAPY: Hydrea  500 mg by mouth daily. Status post approximately 38 months months of treatment.  The dose of Hydrea  was increased to 500 mg twice a day on 04/03/2018.   INTERVAL HISTORY: Adam Tapia 89 y.o. male returns to the clinic today for 75-month follow-up visit accompanied by his wife.Discussed the use of AI scribe software for clinical note transcription with the patient, who gave verbal consent to proceed.  History of Present Illness   The patient, with a longstanding history of treatment with hydroxyurea  for myeloproliferative disorder, presents with concerns about cognitive decline and physical limitations. He reports a noticeable change in memory, which he attributes to a fall that resulted in a hospital visit in July or August. Since the fall, he has experienced a loss of confidence in his ability to perform tasks he previously managed with ease, such as driving and physical work. This has led to feelings of being a burden and a loss of personal freedom.  The patient's family has expressed concerns about his ability to drive safely, citing instances of forgetfulness and disorientation. Despite these concerns, the patient expresses a strong desire to regain some independence, particularly in regards to driving.  In addition to cognitive and physical concerns, the patient reports bilateral lower extremity swelling. He is currently on amlodipine  for hypertension, which may be contributing to the edema. The swelling is causing discomfort and has necessitated modifications to footwear.  The patient's blood work shows a slight elevation in white blood cell count, but hemoglobin and platelet levels are within normal limits. The  hydroxyurea  dosage remains unchanged.        ALLERGIES:  is allergic to cinnamon and nsaids.  MEDICATIONS:  Current Outpatient Medications  Medication Sig Dispense Refill   acetaminophen  (TYLENOL ) 325 MG tablet Take 650 mg by mouth daily as needed for moderate pain.     allopurinol  (ZYLOPRIM ) 100 MG tablet TAKE 1 TABLET BY MOUTH EVERY DAY (Patient taking differently: Take 100 mg by mouth daily as needed (gout).) 90 tablet 1   amLODipine  (NORVASC ) 5 MG tablet Take 5 mg by mouth daily.     atenolol  (TENORMIN ) 25 MG tablet Take 25 mg by mouth daily.  5   candesartan  (ATACAND ) 8 MG tablet Take 8 mg by mouth daily.     cyanocobalamin  (VITAMIN B12) 500 MCG tablet Take 1 tablet (500 mcg total) by mouth daily.     diclofenac Sodium (VOLTAREN) 1 % GEL Apply 2 g topically daily as needed (pain).     fexofenadine (ALLEGRA) 180 MG tablet Take 180 mg by mouth daily.     fluticasone  (FLONASE ) 50 MCG/ACT nasal spray Place 1 spray into both nostrils daily as needed for allergies or rhinitis.     hydroxyurea  (HYDREA ) 500 MG capsule TAKE 1 CAPSULE BY MOUTH TWICE A DAY (MAY TAKE WITH FOOD TO MINIMIZE SIDE EFFECTS) 60 capsule 2   ketotifen  (ZADITOR ) 0.025 % ophthalmic solution Place 1 drop into both eyes daily as needed (itching eyes).     NON FORMULARY Apply 1 application  topically daily as needed (pain). Veterinary liniment gel     Probiotic Product (RESTORA) CAPS Take 1 capsule by mouth daily.     No current facility-administered medications for this visit.  REVIEW OF SYSTEMS:  A comprehensive review of systems was negative except for: Constitutional: positive for fatigue Neurological: positive for memory problems   PHYSICAL EXAMINATION: General appearance: alert, cooperative, and no distress Head: Normocephalic, without obvious abnormality, atraumatic Neck: no adenopathy Lymph nodes: Cervical, supraclavicular, and axillary nodes normal. Resp: clear to auscultation bilaterally Back: symmetric,  no curvature. ROM normal. No CVA tenderness. Cardio: regular rate and rhythm, S1, S2 normal, no murmur, click, rub or gallop GI: soft, non-tender; bowel sounds normal; no masses,  no organomegaly Extremities: extremities normal, atraumatic, no cyanosis or edema  ECOG PERFORMANCE STATUS: 1 - Symptomatic but completely ambulatory  Blood pressure (!) 163/77, pulse 65, temperature (!) 97.1 F (36.2 C), temperature source Temporal, resp. rate 16, height 5' 8 (1.727 m), weight 194 lb 4.8 oz (88.1 kg), SpO2 100%.  LABORATORY DATA: Lab Results  Component Value Date   WBC 12.2 (H) 11/29/2023   HGB 12.8 (L) 11/29/2023   HCT 35.9 (L) 11/29/2023   MCV 121.3 (H) 11/29/2023   PLT 293 11/29/2023      Chemistry      Component Value Date/Time   NA 134 (L) 07/26/2023 0923   NA 138 10/05/2017 1018   K 3.4 (L) 07/26/2023 0923   K 4.4 10/05/2017 1018   CL 106 07/26/2023 0923   CO2 22 07/26/2023 0923   CO2 25 10/05/2017 1018   BUN 24 (H) 07/26/2023 0923   BUN 21.8 10/05/2017 1018   CREATININE 1.28 (H) 07/26/2023 0923   CREATININE 1.58 (H) 07/06/2023 1442   CREATININE 1.3 10/05/2017 1018   GLU 88 03/17/2016 0000      Component Value Date/Time   CALCIUM  9.1 07/26/2023 0923   CALCIUM  9.9 10/05/2017 1018   ALKPHOS 43 07/26/2023 0923   ALKPHOS 154 (H) 10/05/2017 1018   AST 17 07/26/2023 0923   AST 18 10/05/2017 1018   ALT 8 07/26/2023 0923   ALT 13 10/05/2017 1018   BILITOT 0.5 07/26/2023 0923   BILITOT 0.45 10/05/2017 1018       RADIOGRAPHIC STUDIES: No results found.  ASSESSMENT AND PLAN:  This is a very pleasant 89 years old African-American male with myeloproliferative disorder and persistent leukocytosis and thrombocytosis with positive JAK 2 mutation. The patient is currently on treatment with hydroxyurea  500 mg p.o. daily status post 38 months. His treatment was changed to 500 mg p.o. twice daily in May 2019. The patient has been tolerating this treatment fairly well.     Chronic Myelogenous Leukemia (CML) On hydroxyurea  500 mg twice daily for years. Recent blood work shows WBC of 12.2, hemoglobin and platelets within normal limits. No dosage changes necessary. - Continue hydroxyurea  500 mg twice daily - Repeat blood work in three months  Memory Decline Reports noticeable memory decline since fall in July/August. Loss of confidence in daily activities and driving. Feels burdensome due to decreased independence. Family concerned about driving safety. - Encourage small tasks at home to maintain activity - Discuss driving limitations with family and consider supervised driving locally  Peripheral Edema Reports bilateral pedal edema. Currently on amlodipine , which can cause leg swelling. Not on diuretics. Primary care doctor to evaluate and potentially prescribe Lasix . - Refer to primary care doctor for evaluation and potential Lasix  prescription  General Health Maintenance Generally in good health with stable blood work aside from slightly elevated WBC. Active but experienced decline in physical activity and independence since fall. - Encourage staying active within limits - Schedule follow-up appointment in three months.  The patient was advised to call immediately if he has any concerning symptoms in the interval. The patient voices understanding of current disease status and treatment options and is in agreement with the current care plan. All questions were answered. The patient knows to call the clinic with any problems, questions or concerns. We can certainly see the patient much sooner if necessary.  Disclaimer: This note was dictated with voice recognition software. Similar sounding words can inadvertently be transcribed and may not be corrected upon review.

## 2023-12-31 ENCOUNTER — Other Ambulatory Visit: Payer: Self-pay | Admitting: Physician Assistant

## 2023-12-31 DIAGNOSIS — D471 Chronic myeloproliferative disease: Secondary | ICD-10-CM

## 2024-02-22 NOTE — Progress Notes (Unsigned)
 [facility] OFFICE PROGRESS NOTE  Fleet Contras, MD [address] [place] [address]  DIAGNOSIS: Myeloproliferative disorder with positive JAK-2 mutation.   PRIOR THERAPY: None  CURRENT THERAPY: Hydrea 500 mg by mouth daily. Status post approximately 71 months months of treatment.  The dose of Hydrea was increased to 500 mg twice a day on 04/03/2018.   INTERVAL HISTORY: Adam Tapia 89 y.o. male returns to the clinic today for a follow-up visit.  The patient was last seen on 11/29/2023.  The patient is here for 74-month follow-up visit.  The patient has a history of meyloproliferative proliferative disorder for which he is currently on Hydrea one tablet BID.  He tolerates this well without any appreciable adverse side effects.   Today he denies any fever, chills, night sweats, or unexplained weight loss.  Denies any signs and symptoms of infection including sore throat, nasal congestion, skin infections, or dysuria..  Denies any abnormal bleeding or bruising.  He denies any abdominal fullness or early satiety.  Denies any rashes or skin changes except he sometimes has mild psoriasis behind his ear for which he uses cream. He denies any nausea, vomiting, or constipation. He may sporadically get diarrhea but it is usually self limiting.  He is here today for evaluation and repeat blood work.  MEDICAL HISTORY: Past Medical History:  Diagnosis Date   Acute blood loss anemia    AF (atrial fibrillation) (HCC)    Allergic rhinitis    Anaphylactic reaction 01/17/2023   Anemia    Arthritis    BPH (benign prostatic hyperplasia)    Constipation    Dysrhythmia    PT STATES HAS HX A FIB - FOLLOWED BY DR.KEVIN LITTLE   GERD (gastroesophageal reflux disease)    OCCASIONAL - HAS NEXIUM IF NEEDED - NOT CURRENTLY TAKING   Hypertension    Myeloproliferative disorder (HCC)    [facility] DR. Arbutus Ped  [facility]   Nocturia    Primary osteoarthritis of left hip    Psoriasis     Unsteady gait     ALLERGIES:  is allergic to cinnamon and nsaids.  MEDICATIONS:  Current Outpatient Medications  Medication Sig Dispense Refill   acetaminophen (TYLENOL) 325 MG tablet Take 650 mg by mouth daily as needed for moderate pain.     allopurinol (ZYLOPRIM) 100 MG tablet TAKE 1 TABLET BY MOUTH EVERY DAY (Patient taking differently: Take 100 mg by mouth daily as needed (gout).) 90 tablet 1   amLODipine (NORVASC) 5 MG tablet Take 5 mg by mouth daily.     atenolol (TENORMIN) 25 MG tablet Take 25 mg by mouth daily.  5   candesartan (ATACAND) 8 MG tablet Take 8 mg by mouth daily.     cyanocobalamin (VITAMIN B12) 500 MCG tablet Take 1 tablet (500 mcg total) by mouth daily.     diclofenac Sodium (VOLTAREN) 1 % GEL Apply 2 g topically daily as needed (pain).     fexofenadine (ALLEGRA) 180 MG tablet Take 180 mg by mouth daily.     fluticasone (FLONASE) 50 MCG/ACT nasal spray Place 1 spray into both nostrils daily as needed for allergies or rhinitis.     hydroxyurea (HYDREA) 500 MG capsule TAKE 1 CAPSULE BY MOUTH TWICE A DAY (MAY TAKE WITH FOOD TO MINIMIZE SIDE EFFECTS) 180 capsule 0   ketotifen (ZADITOR) 0.025 % ophthalmic solution Place 1 drop into both eyes daily as needed (itching eyes).     NON FORMULARY Apply 1  application  topically daily as needed (pain). Veterinary liniment gel     Probiotic Product (RESTORA) CAPS Take 1 capsule by mouth daily.     No current facility-administered medications for this visit.    SURGICAL HISTORY:  Past Surgical History:  Procedure Laterality Date   HERNIA REPAIR  1998   TOTAL HIP ARTHROPLASTY Left 03/11/2016   Procedure: LEFT TOTAL HIP ARTHROPLASTY ANTERIOR APPROACH;  Surgeon: Kathryne Hitch, MD;  Location: [facility];  Service: Orthopedics;  Laterality: Left;  Went from Spinal to an LMA    REVIEW OF SYSTEMS:   Review of Systems  Constitutional: Negative for appetite change, chills, fatigue, fever and unexpected weight change.  HENT:  Negative for mouth sores, nosebleeds, sore throat and trouble swallowing.   Eyes: Negative for eye problems and icterus.  Respiratory: Negative for cough, hemoptysis, shortness of breath and wheezing.   Cardiovascular: Negative for chest pain and leg swelling.  Gastrointestinal: Intermittent self limiting diarrhea. Negative for abdominal pain, constipation, nausea and vomiting.  Genitourinary: Negative for bladder incontinence, difficulty urinating, dysuria, frequency and hematuria.   Musculoskeletal: Negative for back pain, gait problem, neck pain and neck stiffness.  Skin: Negative for itching and rash except for mild psoriasis behind his ear.  Neurological: Negative for dizziness, extremity weakness, gait problem, headaches, light-headedness and seizures.  Hematological: Negative for adenopathy. Does not bruise/bleed easily.  Psychiatric/Behavioral: Negative for confusion, depression and sleep disturbance. The patient is not nervous/anxious.     PHYSICAL EXAMINATION:  There were no vitals taken for this visit.  ECOG PERFORMANCE STATUS: 1  Physical Exam  Constitutional: Oriented to person, place, and time and well-developed, well-nourished, and in no distress.  HENT:  Head: Normocephalic and atraumatic.  Mouth/Throat: Oropharynx is clear and moist. No oropharyngeal exudate.  Eyes: Conjunctivae are normal. Right eye exhibits no discharge. Left eye exhibits no discharge. No scleral icterus.  Neck: Normal range of motion. Neck supple.  Cardiovascular: Normal rate, regular rhythm, normal heart sounds and intact distal pulses.   Pulmonary/Chest: Effort normal and breath sounds normal. No respiratory distress. No wheezes. No rales.  Abdominal: Soft. Bowel sounds are normal. Exhibits no distension and no mass. There is no tenderness.  Musculoskeletal: Normal range of motion. Exhibits no edema.  Lymphadenopathy:    No cervical adenopathy.  Neurological: Alert and oriented to person, place,  and time. Exhibits normal muscle tone. Gait normal. Coordination normal.  Skin: Skin is warm and dry. No rash noted. Not diaphoretic. No erythema. No pallor.  Psychiatric: Mood, memory and judgment normal.  Vitals reviewed.  LABORATORY DATA: Lab Results  Component Value Date   WBC 12.2 (H) 11/29/2023   HGB 12.8 (L) 11/29/2023   HCT 35.9 (L) 11/29/2023   MCV 121.3 (H) 11/29/2023   PLT 293 11/29/2023      Chemistry      Component Value Date/Time   NA 137 11/29/2023 1037   NA 138 10/05/2017 1018   K 4.3 11/29/2023 1037   K 4.4 10/05/2017 1018   CL 107 11/29/2023 1037   CO2 26 11/29/2023 1037   CO2 25 10/05/2017 1018   BUN 20 11/29/2023 1037   BUN 21.8 10/05/2017 1018   CREATININE 1.21 11/29/2023 1037   CREATININE 1.58 (H) 07/06/2023 1442   CREATININE 1.3 10/05/2017 1018   GLU 88 03/17/2016 0000      Component Value Date/Time   CALCIUM 9.7 11/29/2023 1037   CALCIUM 9.9 10/05/2017 1018   ALKPHOS 84 11/29/2023 1037   ALKPHOS 154 (  H) 10/05/2017 1018   AST 16 11/29/2023 1037   AST 18 10/05/2017 1018   ALT 8 11/29/2023 1037   ALT 13 10/05/2017 1018   BILITOT 0.5 11/29/2023 1037   BILITOT 0.45 10/05/2017 1018       RADIOGRAPHIC STUDIES:  No results found.   ASSESSMENT/PLAN:  This is a very pleasant 89 year old African-American male with myeloproliferative disorder and persistent leukocytosis and thrombocytosis with positive JAK2 mutation.  He has been on treatment with Hydrea 500 mg since 2019.  He is currently on 500 mg twice daily.  Labs were reviewed which show WBC of 15, normal Hbg of 13.0, and normal platelet count of 292. LDH is pending. After he left, his creatinine is elevated. We will call the patient and instruct him to increase his hydration.   Recommend he continue on the same treatment at the same dose. He does not need a refill of his hydrea at this time.   We will see him back for follow-up visit in 3 months for evaluation repeat blood work.  The  patient was advised to call immediately if she has any concerning symptoms in the interval. The patient voices understanding of current disease status and treatment options and is in agreement with the current care plan. All questions were answered. The patient knows to call the clinic with any problems, questions or concerns. We can certainly see the patient much sooner if necessary   No orders of the defined types were placed in this encounter.    The total time spent in the appointment was 20-29 minutes  Uday Jantz L Aracelia Brinson, PA-C 02/22/24

## 2024-02-28 ENCOUNTER — Ambulatory Visit: Payer: Medicare Other | Admitting: Internal Medicine

## 2024-02-28 ENCOUNTER — Inpatient Hospital Stay: Payer: Medicare Other | Attending: Internal Medicine

## 2024-02-28 ENCOUNTER — Inpatient Hospital Stay (HOSPITAL_BASED_OUTPATIENT_CLINIC_OR_DEPARTMENT_OTHER): Admitting: Physician Assistant

## 2024-02-28 VITALS — BP 127/66 | HR 68 | Temp 97.3°F | Resp 16 | Wt 190.7 lb

## 2024-02-28 DIAGNOSIS — R7989 Other specified abnormal findings of blood chemistry: Secondary | ICD-10-CM | POA: Insufficient documentation

## 2024-02-28 DIAGNOSIS — D471 Chronic myeloproliferative disease: Secondary | ICD-10-CM | POA: Insufficient documentation

## 2024-02-28 DIAGNOSIS — D75839 Thrombocytosis, unspecified: Secondary | ICD-10-CM | POA: Diagnosis not present

## 2024-02-28 DIAGNOSIS — D72829 Elevated white blood cell count, unspecified: Secondary | ICD-10-CM | POA: Diagnosis not present

## 2024-02-28 LAB — CBC WITH DIFFERENTIAL (CANCER CENTER ONLY)
Abs Immature Granulocytes: 0.12 10*3/uL — ABNORMAL HIGH (ref 0.00–0.07)
Basophils Absolute: 0.1 10*3/uL (ref 0.0–0.1)
Basophils Relative: 1 %
Eosinophils Absolute: 0 10*3/uL (ref 0.0–0.5)
Eosinophils Relative: 0 %
HCT: 37.3 % — ABNORMAL LOW (ref 39.0–52.0)
Hemoglobin: 13 g/dL (ref 13.0–17.0)
Immature Granulocytes: 1 %
Lymphocytes Relative: 12 %
Lymphs Abs: 1.8 10*3/uL (ref 0.7–4.0)
MCH: 40.4 pg — ABNORMAL HIGH (ref 26.0–34.0)
MCHC: 34.9 g/dL (ref 30.0–36.0)
MCV: 115.8 fL — ABNORMAL HIGH (ref 80.0–100.0)
Monocytes Absolute: 1 10*3/uL (ref 0.1–1.0)
Monocytes Relative: 7 %
Neutro Abs: 11.9 10*3/uL — ABNORMAL HIGH (ref 1.7–7.7)
Neutrophils Relative %: 79 %
Platelet Count: 292 10*3/uL (ref 150–400)
RBC: 3.22 MIL/uL — ABNORMAL LOW (ref 4.22–5.81)
RDW: 13.7 % (ref 11.5–15.5)
WBC Count: 15 10*3/uL — ABNORMAL HIGH (ref 4.0–10.5)
nRBC: 0 % (ref 0.0–0.2)

## 2024-02-28 LAB — CMP (CANCER CENTER ONLY)
ALT: 8 U/L (ref 0–44)
AST: 17 U/L (ref 15–41)
Albumin: 4 g/dL (ref 3.5–5.0)
Alkaline Phosphatase: 80 U/L (ref 38–126)
Anion gap: 6 (ref 5–15)
BUN: 30 mg/dL — ABNORMAL HIGH (ref 8–23)
CO2: 26 mmol/L (ref 22–32)
Calcium: 9.6 mg/dL (ref 8.9–10.3)
Chloride: 107 mmol/L (ref 98–111)
Creatinine: 1.56 mg/dL — ABNORMAL HIGH (ref 0.61–1.24)
GFR, Estimated: 40 mL/min — ABNORMAL LOW (ref >60)
Glucose, Bld: 84 mg/dL (ref 70–99)
Potassium: 3.9 mmol/L (ref 3.5–5.1)
Sodium: 139 mmol/L (ref 135–145)
Total Bilirubin: 0.4 mg/dL (ref 0.0–1.2)
Total Protein: 8.1 g/dL (ref 6.5–8.1)

## 2024-02-28 LAB — LACTATE DEHYDROGENASE: LDH: 173 U/L (ref 98–192)

## 2024-03-27 ENCOUNTER — Other Ambulatory Visit: Payer: Self-pay | Admitting: Physician Assistant

## 2024-03-27 DIAGNOSIS — D471 Chronic myeloproliferative disease: Secondary | ICD-10-CM

## 2024-05-25 NOTE — Progress Notes (Deleted)
 [facility] OFFICE PROGRESS NOTE  Shelda Atlas, MD [address] [place] [address]  DIAGNOSIS: Myeloproliferative disorder with positive JAK-2 mutation   PRIOR THERAPY: None  CURRENT THERAPY:  Hydrea  500 mg by mouth daily. Status post approximately 71 months months of treatment.  The dose of Hydrea  was increased to 500 mg twice a day on 04/03/2018.   INTERVAL HISTORY: Adam Tapia 89 y.o. male returns  to the clinic today for a follow-up visit.  The patient was last seen on 02/28/24.  The patient is here for 71-month follow-up visit.  The patient has a history of meyloproliferative proliferative disorder for which he is currently on Hydrea  one tablet BID.  He tolerates this well without any appreciable adverse side effects.    Today he denies any fever, chills, night sweats, or unexplained weight loss.  Denies any signs and symptoms of infection including sore throat, nasal congestion, skin infections, or dysuria..  Denies any abnormal bleeding or bruising.  He denies any abdominal fullness or early satiety.  Denies any rashes or skin changes except he sometimes has mild psoriasis behind his ear for which he uses cream. He denies any nausea, vomiting, or constipation. He may sporadically get diarrhea but it is usually self limiting.  He is here today for evaluation and repeat blood work     MEDICAL HISTORY: Past Medical History:  Diagnosis Date   Acute blood loss anemia    AF (atrial fibrillation) (HCC)    Allergic rhinitis    Anaphylactic reaction 01/17/2023   Anemia    Arthritis    BPH (benign prostatic hyperplasia)    Constipation    Dysrhythmia    PT STATES HAS HX A FIB - FOLLOWED BY DR.KEVIN LITTLE   GERD (gastroesophageal reflux disease)    OCCASIONAL - HAS NEXIUM IF NEEDED - NOT CURRENTLY TAKING   Hypertension    Myeloproliferative disorder (HCC)    [facility]   Nocturia    Primary osteoarthritis of left hip     Psoriasis    Unsteady gait     ALLERGIES:  is allergic to cinnamon and nsaids.  MEDICATIONS:  Current Outpatient Medications  Medication Sig Dispense Refill   acetaminophen  (TYLENOL ) 325 MG tablet Take 650 mg by mouth daily as needed for moderate pain.     allopurinol  (ZYLOPRIM ) 100 MG tablet TAKE 1 TABLET BY MOUTH EVERY DAY (Patient taking differently: Take 100 mg by mouth daily as needed (gout).) 90 tablet 1   amLODipine  (NORVASC ) 5 MG tablet Take 5 mg by mouth daily.     atenolol  (TENORMIN ) 25 MG tablet Take 25 mg by mouth daily.  5   candesartan  (ATACAND ) 8 MG tablet Take 8 mg by mouth daily.     cyanocobalamin  (VITAMIN B12) 500 MCG tablet Take 1 tablet (500 mcg total) by mouth daily.     diclofenac Sodium (VOLTAREN) 1 % GEL Apply 2 g topically daily as needed (pain).     fexofenadine (ALLEGRA) 180 MG tablet Take 180 mg by mouth daily.     fluticasone  (FLONASE ) 50 MCG/ACT nasal spray Place 1 spray into both nostrils daily as needed for allergies or rhinitis.     hydroxyurea  (HYDREA ) 500 MG capsule TAKE 1 CAPSULE BY MOUTH TWICE A DAY (MAY TAKE WITH FOOD TO MINIMIZE SIDE EFFECTS) 180 capsule 0   ketotifen  (ZADITOR ) 0.025 % ophthalmic solution Place 1 drop into both eyes daily as needed (itching eyes).  NON FORMULARY Apply 1 application  topically daily as needed (pain). Veterinary liniment gel     Probiotic Product (RESTORA) CAPS Take 1 capsule by mouth daily.     No current facility-administered medications for this visit.    SURGICAL HISTORY:  Past Surgical History:  Procedure Laterality Date   HERNIA REPAIR  1998   TOTAL HIP ARTHROPLASTY Left 03/11/2016   Procedure: LEFT TOTAL HIP ARTHROPLASTY ANTERIOR APPROACH;  Surgeon: Lonni CINDERELLA Poli, MD;  Location: [facility];  Service: Orthopedics;  Laterality: Left;  Went from Spinal to an LMA    REVIEW OF SYSTEMS:   Review of Systems  Constitutional: Negative for appetite change, chills, fatigue, fever and unexpected weight  change.  HENT:   Negative for mouth sores, nosebleeds, sore throat and trouble swallowing.   Eyes: Negative for eye problems and icterus.  Respiratory: Negative for cough, hemoptysis, shortness of breath and wheezing.   Cardiovascular: Negative for chest pain and leg swelling.  Gastrointestinal: Negative for abdominal pain, constipation, diarrhea, nausea and vomiting.  Genitourinary: Negative for bladder incontinence, difficulty urinating, dysuria, frequency and hematuria.   Musculoskeletal: Negative for back pain, gait problem, neck pain and neck stiffness.  Skin: Negative for itching and rash.  Neurological: Negative for dizziness, extremity weakness, gait problem, headaches, light-headedness and seizures.  Hematological: Negative for adenopathy. Does not bruise/bleed easily.  Psychiatric/Behavioral: Negative for confusion, depression and sleep disturbance. The patient is not nervous/anxious.     PHYSICAL EXAMINATION:  There were no vitals taken for this visit.  ECOG PERFORMANCE STATUS: {CHL ONC ECOG [id number]  Physical Exam  Constitutional: Oriented to person, place, and time and well-developed, well-nourished, and in no distress. No distress.  HENT:  Head: Normocephalic and atraumatic.  Mouth/Throat: Oropharynx is clear and moist. No oropharyngeal exudate.  Eyes: Conjunctivae are normal. Right eye exhibits no discharge. Left eye exhibits no discharge. No scleral icterus.  Neck: Normal range of motion. Neck supple.  Cardiovascular: Normal rate, regular rhythm, normal heart sounds and intact distal pulses.   Pulmonary/Chest: Effort normal and breath sounds normal. No respiratory distress. No wheezes. No rales.  Abdominal: Soft. Bowel sounds are normal. Exhibits no distension and no mass. There is no tenderness.  Musculoskeletal: Normal range of motion. Exhibits no edema.  Lymphadenopathy:    No cervical adenopathy.  Neurological: Alert and oriented to person, place, and time.  Exhibits normal muscle tone. Gait normal. Coordination normal.  Skin: Skin is warm and dry. No rash noted. Not diaphoretic. No erythema. No pallor.  Psychiatric: Mood, memory and judgment normal.  Vitals reviewed.  LABORATORY DATA: Lab Results  Component Value Date   WBC 15.0 (H) 02/28/2024   HGB 13.0 02/28/2024   HCT 37.3 (L) 02/28/2024   MCV 115.8 (H) 02/28/2024   PLT 292 02/28/2024      Chemistry      Component Value Date/Time   NA 139 02/28/2024 1134   NA 138 10/05/2017 1018   K 3.9 02/28/2024 1134   K 4.4 10/05/2017 1018   CL 107 02/28/2024 1134   CO2 26 02/28/2024 1134   CO2 25 10/05/2017 1018   BUN 30 (H) 02/28/2024 1134   BUN 21.8 10/05/2017 1018   CREATININE 1.56 (H) 02/28/2024 1134   CREATININE 1.58 (H) 07/06/2023 1442   CREATININE 1.3 10/05/2017 1018   GLU 88 03/17/2016 0000      Component Value Date/Time   CALCIUM  9.6 02/28/2024 1134   CALCIUM  9.9 10/05/2017 1018   ALKPHOS 80 02/28/2024 1134  ALKPHOS 154 (H) 10/05/2017 1018   AST 17 02/28/2024 1134   AST 18 10/05/2017 1018   ALT 8 02/28/2024 1134   ALT 13 10/05/2017 1018   BILITOT 0.4 02/28/2024 1134   BILITOT 0.45 10/05/2017 1018       RADIOGRAPHIC STUDIES:  No results found.   ASSESSMENT/PLAN:  This is a very pleasant 89 year old African-American male with myeloproliferative disorder and persistent leukocytosis and thrombocytosis with positive JAK2 mutation.   He has been on treatment with Hydrea  500 mg since 2019.  He is currently on 500 mg twice daily.   Labs were reviewed which show WBC of ***, normal Hbg of ***, and normal platelet count of ***. LDH is pending. After he left, his creatinine is elevated. We will call the patient and instruct him to increase his hydration.    Recommend he continue on the same treatment at the same dose. He does not need a refill of his hydrea  at this time.    We will see him back for follow-up visit in 3 months for evaluation repeat blood work.  The  patient was advised to call immediately if he has any concerning symptoms in the interval. The patient voices understanding of current disease status and treatment options and is in agreement with the current care plan. All questions were answered. The patient knows to call the clinic with any problems, questions or concerns. We can certainly see the patient much sooner if necessary    No orders of the defined types were placed in this encounter.    I spent {CHL ONC TIME VISIT - DTPQU:[id number]} counseling the patient face to face. The total time spent in the appointment was {CHL ONC TIME VISIT - DTPQU:[id number]}.  Murrel Freet L Burnie Therien, PA-C 05/25/24

## 2024-05-27 ENCOUNTER — Telehealth: Payer: Self-pay | Admitting: Internal Medicine

## 2024-05-27 NOTE — Telephone Encounter (Signed)
 Rescheduled appointments per the patients request.

## 2024-05-29 ENCOUNTER — Other Ambulatory Visit

## 2024-05-29 ENCOUNTER — Ambulatory Visit: Admitting: Physician Assistant

## 2024-06-05 ENCOUNTER — Telehealth: Payer: Self-pay | Admitting: Medical Oncology

## 2024-06-05 NOTE — Telephone Encounter (Signed)
 Rosetta confirmed appts for next week.

## 2024-06-06 NOTE — Progress Notes (Signed)
 [facility] OFFICE PROGRESS NOTE  Shelda Atlas, MD [address] [place] [address]  DIAGNOSIS: Myeloproliferative disorder with positive JAK-2 mutation   PRIOR THERAPY: None  CURRENT THERAPY:  Hydrea  500 mg by mouth daily. Status post approximately 71 months months of treatment.  The dose of Hydrea  was increased to 500 mg twice a day on 04/03/2018.   INTERVAL HISTORY: Adam Tapia 89 y.o. male returns to the clinic today for a follow-up visit accompanied by his wife. The patient was last seen on 02/28/24. The patient is here for 71-month follow-up visit.  The patient has a history of meyloproliferative proliferative disorder for which he is currently on Hydrea  one tablet BID.  He tolerates this well without any appreciable adverse side effects.    Today he denies any fever, chills, or night sweats. He has lost weight intentionally recently due to making dietary changes. Denies any signs and symptoms of infection including sore throat, nasal congestion, skin infections, or dysuria. Denies any abnormal bleeding or bruising.  He denies any abdominal fullness or early satiety.  Denies any rashes except he does have history of psoriasis and has a few areas of localized itching on his back and behind his ear. He uses cream. He denies any nausea, vomiting, or constipation. He may sporadically get diarrhea but it is usually self limiting.  He is here today for evaluation and repeat blood work   MEDICAL HISTORY: Past Medical History:  Diagnosis Date   Acute blood loss anemia    AF (atrial fibrillation) (HCC)    Allergic rhinitis    Anaphylactic reaction 01/17/2023   Anemia    Arthritis    BPH (benign prostatic hyperplasia)    Constipation    Dysrhythmia    PT STATES HAS HX A FIB - FOLLOWED BY DR.KEVIN LITTLE   GERD (gastroesophageal reflux disease)    OCCASIONAL - HAS NEXIUM IF NEEDED - NOT CURRENTLY TAKING   Hypertension    Myeloproliferative disorder (HCC)     [facility] DR[facility]ERROD  [facility]   Nocturia    Primary osteoarthritis of left hip    Psoriasis    Unsteady gait     ALLERGIES:  is allergic to cinnamon and nsaids.  MEDICATIONS:  Current Outpatient Medications  Medication Sig Dispense Refill   acetaminophen  (TYLENOL ) 325 MG tablet Take 650 mg by mouth daily as needed for moderate pain.     allopurinol  (ZYLOPRIM ) 100 MG tablet TAKE 1 TABLET BY MOUTH EVERY DAY (Patient taking differently: Take 100 mg by mouth daily as needed (gout).) 90 tablet 1   amLODipine  (NORVASC ) 5 MG tablet Take 5 mg by mouth daily.     atenolol  (TENORMIN ) 25 MG tablet Take 25 mg by mouth daily.  5   candesartan  (ATACAND ) 8 MG tablet Take 8 mg by mouth daily.     cyanocobalamin  (VITAMIN B12) 500 MCG tablet Take 1 tablet (500 mcg total) by mouth daily.     diclofenac Sodium (VOLTAREN) 1 % GEL Apply 2 g topically daily as needed (pain).     fexofenadine (ALLEGRA) 180 MG tablet Take 180 mg by mouth daily.     fluticasone  (FLONASE ) 50 MCG/ACT nasal spray Place 1 spray into both nostrils daily as needed for allergies or rhinitis.     hydroxyurea  (HYDREA ) 500 MG capsule TAKE 1 CAPSULE BY MOUTH TWICE A DAY (MAY TAKE WITH FOOD TO MINIMIZE SIDE EFFECTS) 180 capsule 0   ketotifen  (ZADITOR ) 0.025 % ophthalmic solution  Place 1 drop into both eyes daily as needed (itching eyes).     NON FORMULARY Apply 1 application  topically daily as needed (pain). Veterinary liniment gel     Probiotic Product (RESTORA) CAPS Take 1 capsule by mouth daily.     No current facility-administered medications for this visit.    SURGICAL HISTORY:  Past Surgical History:  Procedure Laterality Date   HERNIA REPAIR  1998   TOTAL HIP ARTHROPLASTY Left 03/11/2016   Procedure: LEFT TOTAL HIP ARTHROPLASTY ANTERIOR APPROACH;  Surgeon: Lonni CINDERELLA Poli, MD;  Location: [facility];  Service: Orthopedics;  Laterality: Left;  Went from Spinal to an LMA    REVIEW OF SYSTEMS:   Review of Systems   Constitutional: Stable fatigue. Negative for appetite change, chills, fever and unexpected weight change.  HENT: Negative for mouth sores, nosebleeds, sore throat and trouble swallowing.   Eyes: Negative for eye problems and icterus.  Respiratory: Negative for cough, hemoptysis, shortness of breath and wheezing.   Cardiovascular: Negative for chest pain and leg swelling.  Gastrointestinal: Negative for abdominal pain, constipation, diarrhea, nausea and vomiting.  Genitourinary: Negative for bladder incontinence, difficulty urinating, dysuria, frequency and hematuria.   Musculoskeletal: Negative for back pain, gait problem, neck pain and neck stiffness.  Skin: Occasional itching. Negative for rash.  Neurological: Negative for dizziness, extremity weakness, gait problem, headaches, light-headedness and seizures.  Hematological: Negative for adenopathy. Does not bruise/bleed easily.  Psychiatric/Behavioral: Negative for confusion, depression and sleep disturbance. The patient is not nervous/anxious.     PHYSICAL EXAMINATION:  There were no vitals taken for this visit.  ECOG PERFORMANCE STATUS: 1  Physical Exam  Constitutional: Oriented to person, place, and time and well-developed, well-nourished, and in no distress.   HENT:  Head: Normocephalic and atraumatic.  Mouth/Throat: Oropharynx is clear and moist. No oropharyngeal exudate.  Eyes: Conjunctivae are normal. Right eye exhibits no discharge. Left eye exhibits no discharge. No scleral icterus.  Neck: Normal range of motion. Neck supple.  Cardiovascular: Normal rate, regular rhythm, normal heart sounds and intact distal pulses.   Pulmonary/Chest: Effort normal and breath sounds normal. No respiratory distress. No wheezes. No rales.  Abdominal: Soft. Bowel sounds are normal. Exhibits no distension and no mass. There is no tenderness.  Musculoskeletal: Normal range of motion. Exhibits no edema.  Lymphadenopathy:    No cervical  adenopathy.  Neurological: Alert and oriented to person, place, and time. Exhibits muscle wasting. Shuffled gait. Uses a cane for ambulation.  Skin: Skin is warm and dry. No rash noted. Not diaphoretic. No erythema. No pallor.  Psychiatric: Mood, memory and judgment normal.  Vitals reviewed.  LABORATORY DATA: Lab Results  Component Value Date   WBC 15.0 (H) 02/28/2024   HGB 13.0 02/28/2024   HCT 37.3 (L) 02/28/2024   MCV 115.8 (H) 02/28/2024   PLT 292 02/28/2024      Chemistry      Component Value Date/Time   NA 139 02/28/2024 1134   NA 138 10/05/2017 1018   K 3.9 02/28/2024 1134   K 4.4 10/05/2017 1018   CL 107 02/28/2024 1134   CO2 26 02/28/2024 1134   CO2 25 10/05/2017 1018   BUN 30 (H) 02/28/2024 1134   BUN 21.8 10/05/2017 1018   CREATININE 1.56 (H) 02/28/2024 1134   CREATININE 1.58 (H) 07/06/2023 1442   CREATININE 1.3 10/05/2017 1018   GLU 88 03/17/2016 0000      Component Value Date/Time   CALCIUM  9.6 02/28/2024 1134  CALCIUM  9.9 10/05/2017 1018   ALKPHOS 80 02/28/2024 1134   ALKPHOS 154 (H) 10/05/2017 1018   AST 17 02/28/2024 1134   AST 18 10/05/2017 1018   ALT 8 02/28/2024 1134   ALT 13 10/05/2017 1018   BILITOT 0.4 02/28/2024 1134   BILITOT 0.45 10/05/2017 1018       RADIOGRAPHIC STUDIES:  No results found.   ASSESSMENT/PLAN:  This is a very pleasant 89 year old African-American male with myeloproliferative disorder and persistent leukocytosis and thrombocytosis with positive JAK2 mutation.   He has been on treatment with Hydrea  500 mg since 2019.  He is currently on 500 mg twice daily.   Labs were reviewed which show WBC of 13.1, normal Hbg of 12.1, and normal platelet count of 277. LDH is pending.    Recommend he continue on the same treatment at the same dose. He does not need a refill of his hydrea  at this time.    We will see him back for follow-up visit in 3 months for evaluation repeat blood work.  The patient was advised to call  immediately if he has any concerning symptoms in the interval. The patient voices understanding of current disease status and treatment options and is in agreement with the current care plan. All questions were answered. The patient knows to call the clinic with any problems, questions or concerns. We can certainly see the patient much sooner if necessary    No orders of the defined types were placed in this encounter.    The total time spent in the appointment was 20-29 minutes  Aarnav Steagall L Toya Palacios, PA-C 06/06/24

## 2024-06-12 ENCOUNTER — Inpatient Hospital Stay: Attending: Physician Assistant

## 2024-06-12 ENCOUNTER — Inpatient Hospital Stay: Admitting: Physician Assistant

## 2024-06-12 VITALS — BP 151/64 | HR 60 | Temp 97.6°F | Resp 18 | Ht 68.0 in | Wt 182.0 lb

## 2024-06-12 DIAGNOSIS — D471 Chronic myeloproliferative disease: Secondary | ICD-10-CM | POA: Diagnosis not present

## 2024-06-12 DIAGNOSIS — D72829 Elevated white blood cell count, unspecified: Secondary | ICD-10-CM | POA: Insufficient documentation

## 2024-06-12 DIAGNOSIS — D75839 Thrombocytosis, unspecified: Secondary | ICD-10-CM | POA: Diagnosis not present

## 2024-06-12 LAB — CMP (CANCER CENTER ONLY)
ALT: 9 U/L (ref 0–44)
AST: 17 U/L (ref 15–41)
Albumin: 3.7 g/dL (ref 3.5–5.0)
Alkaline Phosphatase: 77 U/L (ref 38–126)
Anion gap: 5 (ref 5–15)
BUN: 24 mg/dL — ABNORMAL HIGH (ref 8–23)
CO2: 24 mmol/L (ref 22–32)
Calcium: 9.2 mg/dL (ref 8.9–10.3)
Chloride: 108 mmol/L (ref 98–111)
Creatinine: 1.47 mg/dL — ABNORMAL HIGH (ref 0.61–1.24)
GFR, Estimated: 43 mL/min — ABNORMAL LOW (ref >60)
Glucose, Bld: 97 mg/dL (ref 70–99)
Potassium: 4.2 mmol/L (ref 3.5–5.1)
Sodium: 137 mmol/L (ref 135–145)
Total Bilirubin: 0.3 mg/dL (ref 0.0–1.2)
Total Protein: 7.6 g/dL (ref 6.5–8.1)

## 2024-06-12 LAB — CBC WITH DIFFERENTIAL (CANCER CENTER ONLY)
Abs Immature Granulocytes: 0.11 K/uL — ABNORMAL HIGH (ref 0.00–0.07)
Basophils Absolute: 0.1 K/uL (ref 0.0–0.1)
Basophils Relative: 1 %
Eosinophils Absolute: 0.1 K/uL (ref 0.0–0.5)
Eosinophils Relative: 1 %
HCT: 35.1 % — ABNORMAL LOW (ref 39.0–52.0)
Hemoglobin: 12.1 g/dL — ABNORMAL LOW (ref 13.0–17.0)
Immature Granulocytes: 1 %
Lymphocytes Relative: 29 %
Lymphs Abs: 3.8 K/uL (ref 0.7–4.0)
MCH: 41.7 pg — ABNORMAL HIGH (ref 26.0–34.0)
MCHC: 34.5 g/dL (ref 30.0–36.0)
MCV: 121 fL — ABNORMAL HIGH (ref 80.0–100.0)
Monocytes Absolute: 1.2 K/uL — ABNORMAL HIGH (ref 0.1–1.0)
Monocytes Relative: 9 %
Neutro Abs: 7.8 K/uL — ABNORMAL HIGH (ref 1.7–7.7)
Neutrophils Relative %: 59 %
Platelet Count: 277 K/uL (ref 150–400)
RBC: 2.9 MIL/uL — ABNORMAL LOW (ref 4.22–5.81)
RDW: 13.2 % (ref 11.5–15.5)
WBC Count: 13.1 K/uL — ABNORMAL HIGH (ref 4.0–10.5)
nRBC: 0 % (ref 0.0–0.2)

## 2024-06-12 LAB — LACTATE DEHYDROGENASE: LDH: 249 U/L — ABNORMAL HIGH (ref 98–192)

## 2024-06-26 ENCOUNTER — Other Ambulatory Visit: Payer: Self-pay | Admitting: Physician Assistant

## 2024-06-26 DIAGNOSIS — D471 Chronic myeloproliferative disease: Secondary | ICD-10-CM

## 2024-09-04 ENCOUNTER — Inpatient Hospital Stay: Attending: Physician Assistant

## 2024-09-04 ENCOUNTER — Inpatient Hospital Stay: Admitting: Internal Medicine

## 2024-09-04 VITALS — BP 120/72 | HR 60 | Temp 97.2°F | Resp 17 | Ht 68.0 in | Wt 183.0 lb

## 2024-09-04 DIAGNOSIS — N189 Chronic kidney disease, unspecified: Secondary | ICD-10-CM | POA: Diagnosis not present

## 2024-09-04 DIAGNOSIS — K5900 Constipation, unspecified: Secondary | ICD-10-CM | POA: Diagnosis not present

## 2024-09-04 DIAGNOSIS — D471 Chronic myeloproliferative disease: Secondary | ICD-10-CM | POA: Insufficient documentation

## 2024-09-04 DIAGNOSIS — R197 Diarrhea, unspecified: Secondary | ICD-10-CM | POA: Diagnosis not present

## 2024-09-04 DIAGNOSIS — Z79899 Other long term (current) drug therapy: Secondary | ICD-10-CM | POA: Insufficient documentation

## 2024-09-04 LAB — CMP (CANCER CENTER ONLY)
ALT: 8 U/L (ref 0–44)
AST: 16 U/L (ref 15–41)
Albumin: 3.8 g/dL (ref 3.5–5.0)
Alkaline Phosphatase: 76 U/L (ref 38–126)
Anion gap: 4 — ABNORMAL LOW (ref 5–15)
BUN: 25 mg/dL — ABNORMAL HIGH (ref 8–23)
CO2: 26 mmol/L (ref 22–32)
Calcium: 9.8 mg/dL (ref 8.9–10.3)
Chloride: 107 mmol/L (ref 98–111)
Creatinine: 1.25 mg/dL — ABNORMAL HIGH (ref 0.61–1.24)
GFR, Estimated: 52 mL/min — ABNORMAL LOW (ref >60)
Glucose, Bld: 88 mg/dL (ref 70–99)
Potassium: 3.9 mmol/L (ref 3.5–5.1)
Sodium: 137 mmol/L (ref 135–145)
Total Bilirubin: 0.5 mg/dL (ref 0.0–1.2)
Total Protein: 7.6 g/dL (ref 6.5–8.1)

## 2024-09-04 LAB — CBC WITH DIFFERENTIAL (CANCER CENTER ONLY)
Abs Immature Granulocytes: 0.08 K/uL — ABNORMAL HIGH (ref 0.00–0.07)
Basophils Absolute: 0.1 K/uL (ref 0.0–0.1)
Basophils Relative: 1 %
Eosinophils Absolute: 0.1 K/uL (ref 0.0–0.5)
Eosinophils Relative: 0 %
HCT: 33 % — ABNORMAL LOW (ref 39.0–52.0)
Hemoglobin: 11.8 g/dL — ABNORMAL LOW (ref 13.0–17.0)
Immature Granulocytes: 1 %
Lymphocytes Relative: 20 %
Lymphs Abs: 2.5 K/uL (ref 0.7–4.0)
MCH: 42.4 pg — ABNORMAL HIGH (ref 26.0–34.0)
MCHC: 35.8 g/dL (ref 30.0–36.0)
MCV: 118.7 fL — ABNORMAL HIGH (ref 80.0–100.0)
Monocytes Absolute: 1.1 K/uL — ABNORMAL HIGH (ref 0.1–1.0)
Monocytes Relative: 9 %
Neutro Abs: 9 K/uL — ABNORMAL HIGH (ref 1.7–7.7)
Neutrophils Relative %: 69 %
Platelet Count: 263 K/uL (ref 150–400)
RBC: 2.78 MIL/uL — ABNORMAL LOW (ref 4.22–5.81)
RDW: 14.6 % (ref 11.5–15.5)
WBC Count: 12.4 K/uL — ABNORMAL HIGH (ref 4.0–10.5)
nRBC: 0 % (ref 0.0–0.2)

## 2024-09-04 LAB — LACTATE DEHYDROGENASE: LDH: 176 U/L (ref 98–192)

## 2024-09-04 NOTE — Progress Notes (Signed)
 [facility] [phone]   [phone]  OFFICE PROGRESS NOTE  Shelda Atlas, MD [address] [place] [address]  DIAGNOSIS: Myeloproliferative disorder with positive JAK-2 mutation.  PRIOR THERAPY: None.  CURRENT THERAPY: Hydrea  500 mg by mouth daily. Status post approximately 38 months months of treatment.  The dose of Hydrea  was increased to 500 mg twice a day on 04/03/2018.   INTERVAL HISTORY: Adam Tapia 89 y.o. male returns to the clinic today for 71-month follow-up visit accompanied by his wife.Discussed the use of AI scribe software for clinical note transcription with the patient, who gave verbal consent to proceed.  History of Present Illness Adam Tapia is a 89 year old male with a myeloproliferative disorder who presents for evaluation and repeat blood work. He is accompanied by his wife.  He is currently undergoing treatment for his myeloproliferative disorder with hydroxyurea  500 mg taken orally twice daily. He experiences occasional constipation, which he attributes to his condition or dietary intake. No nausea, vomiting, or diarrhea related to his medication use.  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 He attributes some of these limitations to his age and physical condition.     ALLERGIES:  is allergic to cinnamon and nsaids.  MEDICATIONS:  Current Outpatient Medications  Medication Sig Dispense Refill   acetaminophen  (TYLENOL ) 325 MG tablet Take 650 mg by mouth daily as needed for moderate pain.     allopurinol  (ZYLOPRIM ) 100 MG tablet TAKE 1 TABLET BY MOUTH EVERY DAY (Patient taking differently: Take 100 mg by mouth daily as needed (gout).) 90 tablet 1   amLODipine  (NORVASC ) 5 MG tablet Take 5 mg by mouth daily.     atenolol  (TENORMIN ) 25 MG tablet Take 25 mg by  mouth daily.  5   candesartan  (ATACAND ) 8 MG tablet Take 8 mg by mouth daily.     cyanocobalamin  (VITAMIN B12) 500 MCG tablet Take 1 tablet (500 mcg total) by mouth daily.     diclofenac Sodium (VOLTAREN) 1 % GEL Apply 2 g topically daily as needed (pain).     fexofenadine (ALLEGRA) 180 MG tablet Take 180 mg by mouth daily.     fluticasone  (FLONASE ) 50 MCG/ACT nasal spray Place 1 spray into both nostrils daily as needed for allergies or rhinitis.     hydroxyurea  (HYDREA ) 500 MG capsule TAKE 1 CAPSULE BY MOUTH TWICE A DAY (MAY TAKE WITH FOOD TO MINIMIZE SIDE EFFECTS) 180 capsule 0   ketotifen  (ZADITOR ) 0.025 % ophthalmic solution Place 1 drop into both eyes daily as needed (itching eyes).     NON FORMULARY Apply 1 application  topically daily as needed (pain). Veterinary liniment gel     Probiotic Product (RESTORA) CAPS Take 1 capsule by mouth daily.     No current facility-administered medications for this visit.    REVIEW OF SYSTEMS:  A comprehensive review of systems was negative except for: Constitutional: positive for fatigue Neurological: positive for memory problems   PHYSICAL EXAMINATION: General appearance: alert, cooperative, and no distress Head: Normocephalic, without obvious abnormality, atraumatic Neck: no adenopathy Lymph nodes: Cervical, supraclavicular, and axillary nodes normal. Resp: clear to auscultation bilaterally Back: symmetric, no curvature. ROM normal. No CVA tenderness. Cardio: regular rate and rhythm, S1, S2 normal, no murmur, click, rub or gallop GI:  soft, non-tender; bowel sounds normal; no masses,  no organomegaly Extremities: extremities normal, atraumatic, no cyanosis or edema  ECOG PERFORMANCE STATUS: 1 - Symptomatic but completely ambulatory  Blood pressure 120/72, pulse 60, temperature (!) 97.2 F (36.2 C), resp. rate 17, height 5' 8 (1.727 m), weight 183 lb (83 kg), SpO2 99%.  LABORATORY DATA: Lab Results  Component Value Date   WBC 12.4 (H)  09/04/2024   HGB 11.8 (L) 09/04/2024   HCT 33.0 (L) 09/04/2024   MCV 118.7 (H) 09/04/2024   PLT 263 09/04/2024      Chemistry      Component Value Date/Time   NA 137 09/04/2024 1034   NA 138 10/05/2017 1018   K 3.9 09/04/2024 1034   K 4.4 10/05/2017 1018   CL 107 09/04/2024 1034   CO2 26 09/04/2024 1034   CO2 25 10/05/2017 1018   BUN 25 (H) 09/04/2024 1034   BUN 21.8 10/05/2017 1018   CREATININE 1.25 (H) 09/04/2024 1034   CREATININE 1.58 (H) 07/06/2023 1442   CREATININE 1.3 10/05/2017 1018   GLU 88 03/17/2016 0000      Component Value Date/Time   CALCIUM  9.8 09/04/2024 1034   CALCIUM  9.9 10/05/2017 1018   ALKPHOS 76 09/04/2024 1034   ALKPHOS 154 (H) 10/05/2017 1018   AST 16 09/04/2024 1034   AST 18 10/05/2017 1018   ALT 8 09/04/2024 1034   ALT 13 10/05/2017 1018   BILITOT 0.5 09/04/2024 1034   BILITOT 0.45 10/05/2017 1018       RADIOGRAPHIC STUDIES: No results found.  ASSESSMENT AND PLAN:  This is a very pleasant 89 years old African-American male with myeloproliferative disorder and persistent leukocytosis and thrombocytosis with positive JAK 2 mutation. The patient is currently on treatment with hydroxyurea  500 mg p.o. daily status post 38 months. His treatment was changed to 500 mg p.o. twice daily in May 2019. Assessment and Plan Assessment & Plan Chronic myeloproliferative disorder with JAK2 mutation Chronic myeloproliferative disorder with JAK2 mutation, currently managed with hydroxyurea . White blood cell count has improved to 12.4, down from 13.1 in July and 15 in April, indicating a positive response to treatment. Hemoglobin is 11.8, indicating anemia but not severe. Platelet count is stable at 263,000. No significant side effects from hydroxyurea  reported, except for occasional gastrointestinal symptoms. 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 This level indicates mild anemia, which is consistent with the underlying condition.  Chronic kidney disease, unspecified stage Chronic kidney disease with improving kidney function. Serum creatinine has decreased to 1.25 from 1.47 in the previous visit and 1.56 before that, indicating improvement.  Constipation and intermittent loose stools Reports of intermittent loose stools and constipation. Symptoms may be related to dietary intake or the underlying condition. The patient was advised to call immediately if he has any concerning symptoms in the interval.  The patient voices understanding of current disease status and treatment options and is in agreement with the current care plan. All questions were answered. The patient knows to call the clinic with any problems, questions or concerns. We can certainly see the patient much sooner if necessary.  Disclaimer: This note was dictated with voice recognition software. Similar sounding words can inadvertently be transcribed and may not be corrected upon review.

## 2024-09-21 ENCOUNTER — Other Ambulatory Visit: Payer: Self-pay | Admitting: Physician Assistant

## 2024-09-21 DIAGNOSIS — D471 Chronic myeloproliferative disease: Secondary | ICD-10-CM

## 2024-09-29 ENCOUNTER — Ambulatory Visit (HOSPITAL_COMMUNITY): Admission: EM | Admit: 2024-09-29 | Discharge: 2024-09-29 | Disposition: A | Discharge status: Home / Self Care

## 2024-09-29 ENCOUNTER — Encounter (HOSPITAL_COMMUNITY): Payer: Self-pay

## 2024-09-29 DIAGNOSIS — M25512 Pain in left shoulder: Secondary | ICD-10-CM

## 2024-09-29 DIAGNOSIS — B029 Zoster without complications: Secondary | ICD-10-CM | POA: Diagnosis not present

## 2024-09-29 MED ORDER — VALACYCLOVIR HCL 1 G PO TABS: 1000.0000 mg | ORAL_TABLET | Freq: Two times a day (BID) | ORAL | 0 refills | Status: AC

## 2024-09-29 NOTE — ED Provider Notes (Signed)
 [facility]    CSN: [id number] Arrival date & time: 09/29/24  1834      History   Chief Complaint Chief Complaint  Patient presents with   Rash    HPI Adam Tapia is a 89 y.o. male.   89 year old male presents urgent care with complaints of a rash along his left side.  This started this morning.  He did not notice a rash prior to this.  He was having some pain in the area but is not severe.  He also relates he has been having about 4 to 6 weeks of some stiffness and pain in his left arm.  This is mostly around his shoulder and upper arm.  He does work in his garden on a regular basis.  This does not limit him from doing his daily activities.  He denies any injury to the area.   Rash Associated symptoms: no abdominal pain, no fever, no joint pain, no shortness of breath, no sore throat and not vomiting     Past Medical History:  Diagnosis Date   Acute blood loss anemia    AF (atrial fibrillation) (HCC)    Allergic rhinitis    Anaphylactic reaction 01/17/2023   Anemia    Arthritis    BPH (benign prostatic hyperplasia)    Constipation    Dysrhythmia    PT STATES HAS HX A FIB - FOLLOWED BY DR.KEVIN LITTLE   GERD (gastroesophageal reflux disease)    OCCASIONAL - HAS NEXIUM IF NEEDED - NOT CURRENTLY TAKING   Hypertension    Myeloproliferative disorder (HCC)    [facility]   Nocturia    Primary osteoarthritis of left hip    Psoriasis    Unsteady gait     Patient Active Problem List   Diagnosis Date Noted   Hyponatremia 06/24/2023   Chronic kidney disease, stage 3b (HCC) 06/24/2023   CAP (community acquired pneumonia) 06/20/2023   Renal insufficiency 01/17/2023   Hypocalcemia 01/17/2023   Essential hypertension 01/17/2023   Macrocytic anemia 01/17/2023   Osteoarthritis of left hip 03/11/2016   Status post total replacement of right hip 03/11/2016   Myeloproliferative disorder (HCC) 08/27/2014   Leukocytosis 06/03/2013     Past Surgical History:  Procedure Laterality Date   HERNIA REPAIR  1998   TOTAL HIP ARTHROPLASTY Left 03/11/2016   Procedure: LEFT TOTAL HIP ARTHROPLASTY ANTERIOR APPROACH;  Surgeon: Lonni CINDERELLA Poli, MD;  Location: [facility];  Service: Orthopedics;  Laterality: Left;  Went from Spinal to an LMA       Home Medications    Prior to Admission medications   Medication Sig Start Date End Date Taking? Authorizing Provider  furosemide  (LASIX ) 20 MG tablet Take 20 mg by mouth daily as needed. 09/24/24  Yes [provider]  valACYclovir (VALTREX) 1000 MG tablet Take 1 tablet (1,000 mg total) by mouth 2 (two) times daily for 7 days. 09/29/24 10/06/24 Yes Rodrickus Min A, PA-C  acetaminophen  (TYLENOL ) 325 MG tablet Take 650 mg by mouth daily as needed for moderate pain.    [provider]  allopurinol  (ZYLOPRIM ) 100 MG tablet TAKE 1 TABLET BY MOUTH EVERY DAY Patient taking differently: Take 100 mg by mouth daily as needed (gout). 02/10/21   Sherrod Sherrod, MD  amLODipine  (NORVASC ) 5 MG tablet Take 5 mg by mouth daily.    [provider]  atenolol  (TENORMIN ) 25 MG tablet Take 25 mg by mouth daily. 07/18/15  [provider]  candesartan  (ATACAND ) 8 MG tablet Take 8 mg by mouth daily. 06/26/19   [provider]  cyanocobalamin  (VITAMIN B12) 500 MCG tablet Take 1 tablet (500 mcg total) by mouth daily. 06/26/23   Patsy Lenis, MD  diclofenac Sodium (VOLTAREN) 1 % GEL Apply 2 g topically daily as needed (pain).    [provider]  fexofenadine (ALLEGRA) 180 MG tablet Take 180 mg by mouth daily.    [provider]  fluticasone  (FLONASE ) 50 MCG/ACT nasal spray Place 1 spray into both nostrils daily as needed for allergies or rhinitis.    [provider]  hydroxyurea  (HYDREA ) 500 MG capsule TAKE 1 CAPSULE BY MOUTH TWICE A DAY (MAY TAKE WITH FOOD TO MINIMIZE SIDE EFFECTS) 09/23/24   Heilingoetter, Cassandra L, PA-C  ketotifen   (ZADITOR ) 0.025 % ophthalmic solution Place 1 drop into both eyes daily as needed (itching eyes).    [provider]  NON FORMULARY Apply 1 application  topically daily as needed (pain). Veterinary liniment gel    [provider]  Probiotic Product (RESTORA) CAPS Take 1 capsule by mouth daily.    [provider]    Family History History reviewed. No pertinent family history.  Social History Social History   Tobacco Use   Smoking status: Never   Smokeless tobacco: Never  Vaping Use   Vaping status: Never Used  Substance Use Topics   Alcohol use: No   Drug use: No     Allergies   Cinnamon and Nsaids   Review of Systems Review of Systems  Constitutional:  Negative for chills and fever.  HENT:  Negative for ear pain and sore throat.   Eyes:  Negative for pain and visual disturbance.  Respiratory:  Negative for cough and shortness of breath.   Cardiovascular:  Negative for chest pain and palpitations.  Gastrointestinal:  Negative for abdominal pain and vomiting.  Genitourinary:  Negative for dysuria and hematuria.  Musculoskeletal:  Negative for arthralgias and back pain.  Skin:  Positive for rash. Negative for color change.  Neurological:  Negative for seizures and syncope.  All other systems reviewed and are negative.    Physical Exam Triage Vital Signs ED Triage Vitals  Encounter Vitals Group     BP 09/29/24 1852 (!) 150/77     Girls Systolic BP Percentile --      Girls Diastolic BP Percentile --      Boys Systolic BP Percentile --      Boys Diastolic BP Percentile --      Pulse Rate 09/29/24 1852 76     Resp 09/29/24 1852 16     Temp 09/29/24 1852 98.3 F (36.8 C)     Temp Source 09/29/24 1852 Oral     SpO2 09/29/24 1852 100 %     Weight --      Height --      Head Circumference --      Peak Flow --      Pain Score 09/29/24 1849 6     Pain Loc --      Pain Education --      Exclude from Growth Chart --    No data  found.  Updated Vital Signs BP (!) 150/77 (BP Location: Left Arm)   Pulse 76   Temp 98.3 F (36.8 C) (Oral)   Resp 16   SpO2 100%   Visual Acuity Right Eye Distance:   Left Eye Distance:   Bilateral Distance:  Right Eye Near:   Left Eye Near:    Bilateral Near:     Physical Exam Vitals and nursing note reviewed.  Constitutional:      General: He is not in acute distress.    Appearance: He is well-developed.  HENT:     Head: Normocephalic and atraumatic.  Eyes:     Conjunctiva/sclera: Conjunctivae normal.  Cardiovascular:     Rate and Rhythm: Normal rate and regular rhythm.     Heart sounds: No murmur heard. Pulmonary:     Effort: Pulmonary effort is normal. No respiratory distress.  Abdominal:     Tenderness: There is no abdominal tenderness.  Musculoskeletal:        General: No swelling.     Cervical back: Neck supple.  Skin:    General: Skin is warm and dry.     Capillary Refill: Capillary refill takes less than 2 seconds.     Comments: Diffuse and numerous grouped vesicles extending from the mid back along the left flank to the mid chest  Neurological:     Mental Status: He is alert.  Psychiatric:        Mood and Affect: Mood normal.      UC Treatments / Results  Labs (all labs ordered are listed, but only abnormal results are displayed) Labs Reviewed - No data to display  EKG   Radiology No results found.  Procedures Procedures (including critical care time)  Medications Ordered in UC Medications - No data to display  Initial Impression / Assessment and Plan / UC Course  I have reviewed the triage vital signs and the nursing notes.  Pertinent labs & imaging results that were available during my care of the patient were reviewed by me and considered in my medical decision making (see chart for details).     Herpes zoster without complication  Left shoulder pain, unspecified chronicity   Symptoms and physical exam findings are  consistent with shingles on the left flank. We will treat this with valacyclovir (valtrex) 1000 mg twice daily for 7 days. This dose is adjusted for your renal function.  Recommend following up with your primary care provider after finishing medication for recheck.  In regards to the left shoulder, this does appear to be more of an arthritic type issue.  We discussed possible x-ray but do not believe that it would change treatment plan and since there was no specific injury would continue to monitor.  If the shoulder begins to cause more significant problems then would follow-up with your primary care provider.  Final Clinical Impressions(s) / UC Diagnoses   Final diagnoses:  Herpes zoster without complication  Left shoulder pain, unspecified chronicity     Discharge Instructions      Symptoms and physical exam findings are consistent with shingles on the left flank. We will treat this with valacyclovir (valtrex) 1000 mg twice daily for 7 days. This dose is adjusted for your renal function.  Recommend following up with your primary care provider after finishing medication for recheck.  In regards to the left shoulder, this does appear to be more of an arthritic type issue.  We discussed possible x-ray but do not believe that it would change treatment plan and since there was no specific injury would continue to monitor.  If the shoulder begins to cause more significant problems then would follow-up with your primary care provider.    ED Prescriptions     Medication Sig Dispense Auth. Provider   valACYclovir (  VALTREX) 1000 MG tablet Take 1 tablet (1,000 mg total) by mouth 2 (two) times daily for 7 days. 14 tablet Teresa Almarie LABOR, PA-C      PDMP not reviewed this encounter.   Teresa Almarie LABOR, [place] 09/29/24 1921

## 2024-09-29 NOTE — ED Triage Notes (Signed)
 Patient here today with c/o a rash on the left side of his torso that it covered in blisters. Patient's wife noticed it this morning and stated that it has worsened since this morning. Patient states that he has some soreness in his left arm for several weeks but has not noticed the rash until today. He has some itching.

## 2024-09-29 NOTE — Discharge Instructions (Addendum)
 Symptoms and physical exam findings are consistent with shingles on the left flank. We will treat this with valacyclovir (valtrex) 1000 mg twice daily for 7 days. This dose is adjusted for your renal function.  Recommend following up with your primary care provider after finishing medication for recheck.  In regards to the left shoulder, this does appear to be more of an arthritic type issue.  We discussed possible x-ray but do not believe that it would change treatment plan and since there was no specific injury would continue to monitor.  If the shoulder begins to cause more significant problems then would follow-up with your primary care provider.

## 2024-12-05 ENCOUNTER — Inpatient Hospital Stay: Admitting: Internal Medicine

## 2024-12-05 ENCOUNTER — Inpatient Hospital Stay: Attending: Internal Medicine

## 2024-12-05 VITALS — BP 119/64 | HR 58 | Temp 97.8°F | Resp 17 | Ht 68.0 in | Wt 170.0 lb

## 2024-12-05 DIAGNOSIS — D471 Chronic myeloproliferative disease: Secondary | ICD-10-CM

## 2024-12-05 LAB — CMP (CANCER CENTER ONLY)
ALT: 6 U/L (ref 0–44)
AST: 19 U/L (ref 15–41)
Albumin: 4.1 g/dL (ref 3.5–5.0)
Alkaline Phosphatase: 75 U/L (ref 38–126)
Anion gap: 10 (ref 5–15)
BUN: 25 mg/dL — ABNORMAL HIGH (ref 8–23)
CO2: 24 mmol/L (ref 22–32)
Calcium: 9.8 mg/dL (ref 8.9–10.3)
Chloride: 106 mmol/L (ref 98–111)
Creatinine: 1.38 mg/dL — ABNORMAL HIGH (ref 0.61–1.24)
GFR, Estimated: 46 mL/min — ABNORMAL LOW
Glucose, Bld: 101 mg/dL — ABNORMAL HIGH (ref 70–99)
Potassium: 4.2 mmol/L (ref 3.5–5.1)
Sodium: 140 mmol/L (ref 135–145)
Total Bilirubin: 0.5 mg/dL (ref 0.0–1.2)
Total Protein: 8.3 g/dL — ABNORMAL HIGH (ref 6.5–8.1)

## 2024-12-05 LAB — CBC WITH DIFFERENTIAL (CANCER CENTER ONLY)
Abs Immature Granulocytes: 0.24 K/uL — ABNORMAL HIGH (ref 0.00–0.07)
Basophils Absolute: 0.1 K/uL (ref 0.0–0.1)
Basophils Relative: 0 %
Eosinophils Absolute: 0 K/uL (ref 0.0–0.5)
Eosinophils Relative: 0 %
HCT: 36.4 % — ABNORMAL LOW (ref 39.0–52.0)
Hemoglobin: 12.7 g/dL — ABNORMAL LOW (ref 13.0–17.0)
Immature Granulocytes: 2 %
Lymphocytes Relative: 28 %
Lymphs Abs: 3.2 K/uL (ref 0.7–4.0)
MCH: 42.3 pg — ABNORMAL HIGH (ref 26.0–34.0)
MCHC: 34.9 g/dL (ref 30.0–36.0)
MCV: 121.3 fL — ABNORMAL HIGH (ref 80.0–100.0)
Monocytes Absolute: 0.9 K/uL (ref 0.1–1.0)
Monocytes Relative: 8 %
Neutro Abs: 7 K/uL (ref 1.7–7.7)
Neutrophils Relative %: 62 %
Platelet Count: 305 K/uL (ref 150–400)
RBC: 3 MIL/uL — ABNORMAL LOW (ref 4.22–5.81)
RDW: 13.3 % (ref 11.5–15.5)
WBC Count: 11.5 K/uL — ABNORMAL HIGH (ref 4.0–10.5)
nRBC: 0 % (ref 0.0–0.2)

## 2024-12-05 LAB — LACTATE DEHYDROGENASE: LDH: 183 U/L (ref 105–235)

## 2024-12-05 NOTE — Progress Notes (Signed)
 "     [name] Telephone:([phone]) [phone]   Fax:([phone]) [phone]  OFFICE PROGRESS NOTE  Adam Atlas, MD [address] [place] [address]  DIAGNOSIS: Myeloproliferative disorder with positive JAK-2 mutation.  PRIOR THERAPY: None.  CURRENT THERAPY: Hydrea  500 mg by mouth daily. Status post approximately 38 months months of treatment.  The dose of Hydrea  was increased to 500 mg twice a day on 04/03/2018.   INTERVAL HISTORY: Adam Tapia 89 y.o. male returns to the clinic today for 5-month follow-up visit accompanied by his wife.Discussed the use of AI scribe software for clinical note transcription with the patient, who gave verbal consent to proceed.  History of Present Illness Adam Tapia is a 89 year old male with myeloproliferative neoplasm with JAK2 mutation who presents for routine hematology/oncology follow-up.  He remains on hydroxyurea  500 mg twice daily, with no recent changes to his regimen and continues to tolerate therapy without adverse effects.  Over the past several months, he has developed progressive slowing of ambulation and increased generalized weakness. His wife notes significant reduction in walking speed, and he does not consistently utilize assistive devices despite recommendations. He spends much of his time seated with feet elevated and requires frequent assistance with activities of daily living. He denies chest pain and respiratory symptoms.  Since the last visit, he developed herpes zoster requiring emergency care, which has resolved without ongoing symptoms. He continues to receive support at home from his wife, who assists with mobility and household tasks.    ALLERGIES:  is allergic to cinnamon and nsaids.  MEDICATIONS:  Current Outpatient Medications  Medication Sig Dispense Refill   acetaminophen  (TYLENOL ) 325 MG tablet Take 650 mg by mouth daily as needed for moderate pain.     allopurinol  (ZYLOPRIM ) 100 MG tablet TAKE 1  TABLET BY MOUTH EVERY DAY (Patient taking differently: Take 100 mg by mouth daily as needed (gout).) 90 tablet 1   amLODipine  (NORVASC ) 5 MG tablet Take 5 mg by mouth daily.     atenolol  (TENORMIN ) 25 MG tablet Take 25 mg by mouth daily.  5   candesartan (ATACAND) 8 MG tablet Take 8 mg by mouth daily.     cyanocobalamin  (VITAMIN B12) 500 MCG tablet Take 1 tablet (500 mcg total) by mouth daily.     diclofenac Sodium (VOLTAREN) 1 % GEL Apply 2 g topically daily as needed (pain).     fexofenadine (ALLEGRA) 180 MG tablet Take 180 mg by mouth daily.     fluticasone  (FLONASE ) 50 MCG/ACT nasal spray Place 1 spray into both nostrils daily as needed for allergies or rhinitis.     furosemide  (LASIX ) 20 MG tablet Take 20 mg by mouth daily as needed.     hydroxyurea  (HYDREA ) 500 MG capsule TAKE 1 CAPSULE BY MOUTH TWICE A DAY (MAY TAKE WITH FOOD TO MINIMIZE SIDE EFFECTS) 180 capsule 0   ketotifen  (ZADITOR ) 0.025 % ophthalmic solution Place 1 drop into both eyes daily as needed (itching eyes).     NON FORMULARY Apply 1 application  topically daily as needed (pain). Veterinary liniment gel     Probiotic Product (RESTORA) CAPS Take 1 capsule by mouth daily.     No current facility-administered medications for this visit.    REVIEW OF SYSTEMS:  A comprehensive review of systems was negative except for: Constitutional: positive for fatigue Neurological: positive for memory problems   PHYSICAL EXAMINATION: General appearance: alert, cooperative, and no distress Head: Normocephalic, without obvious abnormality, atraumatic Neck: no  adenopathy Lymph nodes: Cervical, supraclavicular, and axillary nodes normal. Resp: clear to auscultation bilaterally Back: symmetric, no curvature. ROM normal. No CVA tenderness. Cardio: regular rate and rhythm, S1, S2 normal, no murmur, click, rub or gallop GI: soft, non-tender; bowel sounds normal; no masses,  no organomegaly Extremities: extremities normal, atraumatic, no  cyanosis or edema  ECOG PERFORMANCE STATUS: 1 - Symptomatic but completely ambulatory  Blood pressure 119/64, pulse (!) 58, temperature 97.8 F (36.6 C), temperature source Temporal, resp. rate 17, height 5' 8 (1.727 m), weight 170 lb (77.1 kg), SpO2 95%.  LABORATORY DATA: Lab Results  Component Value Date   WBC 11.5 (H) 12/05/2024   HGB 12.7 (L) 12/05/2024   HCT 36.4 (L) 12/05/2024   MCV 121.3 (H) 12/05/2024   PLT 305 12/05/2024      Chemistry      Component Value Date/Time   NA 137 09/04/2024 1034   NA 138 10/05/2017 1018   K 3.9 09/04/2024 1034   K 4.4 10/05/2017 1018   CL 107 09/04/2024 1034   CO2 26 09/04/2024 1034   CO2 25 10/05/2017 1018   BUN 25 (H) 09/04/2024 1034   BUN 21.8 10/05/2017 1018   CREATININE 1.25 (H) 09/04/2024 1034   CREATININE 1.58 (H) 07/06/2023 1442   CREATININE 1.3 10/05/2017 1018   GLU 88 03/17/2016 0000      Component Value Date/Time   CALCIUM  9.8 09/04/2024 1034   CALCIUM  9.9 10/05/2017 1018   ALKPHOS 76 09/04/2024 1034   ALKPHOS 154 (H) 10/05/2017 1018   AST 16 09/04/2024 1034   AST 18 10/05/2017 1018   ALT 8 09/04/2024 1034   ALT 13 10/05/2017 1018   BILITOT 0.5 09/04/2024 1034   BILITOT 0.45 10/05/2017 1018       RADIOGRAPHIC STUDIES: No results found.  ASSESSMENT AND PLAN:  This is a very pleasant 89 years old African-American male with myeloproliferative disorder and persistent leukocytosis and thrombocytosis with positive JAK 2 mutation. The patient is currently on treatment with hydroxyurea  500 mg p.o. daily status post 38 months. His treatment was changed to 500 mg p.o. twice daily in May 2019. He has been tolerating this treatment well with no concerning adverse effects. Assessment and Plan Assessment & Plan Myeloproliferative neoplasm with JAK2 mutation Chronic myeloproliferative neoplasm with JAK2 mutation, well-managed on hydroxyurea . He tolerates therapy at age 58 without new complaints or hematologic  complications. Laboratory studies remain within target parameters. Generalized weakness and decreased mobility are likely multifactorial, attributed to advanced age. No evidence of disease progression or acute complications. - Continued hydroxyurea  500 mg twice daily. - Reviewed recent laboratory results, which remain within target range. - Scheduled follow-up in three months with repeat laboratory studies. He was advised to call immediately if he has any concerning symptoms in the interval.  The patient voices understanding of current disease status and treatment options and is in agreement with the current care plan. All questions were answered. The patient knows to call the clinic with any problems, questions or concerns. We can certainly see the patient much sooner if necessary.  Disclaimer: This note was dictated with voice recognition software. Similar sounding words can inadvertently be transcribed and may not be corrected upon review.       "

## 2024-12-21 ENCOUNTER — Other Ambulatory Visit: Payer: Self-pay | Admitting: Physician Assistant

## 2024-12-21 DIAGNOSIS — D471 Chronic myeloproliferative disease: Secondary | ICD-10-CM

## 2025-03-05 ENCOUNTER — Inpatient Hospital Stay: Admitting: Internal Medicine

## 2025-03-05 ENCOUNTER — Inpatient Hospital Stay
